# Patient Record
Sex: Male | Born: 1955 | Race: White | Hispanic: No | Marital: Married | Smoking: Former smoker
Health system: Southern US, Community
[De-identification: ages and names within clinical notes are randomized; demographics above are authoritative.]

## PROBLEM LIST (undated history)

## (undated) DIAGNOSIS — E119 Type 2 diabetes mellitus without complications: Secondary | ICD-10-CM

## (undated) DIAGNOSIS — G629 Polyneuropathy, unspecified: Secondary | ICD-10-CM

## (undated) DIAGNOSIS — I714 Abdominal aortic aneurysm, without rupture: Secondary | ICD-10-CM

## (undated) DIAGNOSIS — I619 Nontraumatic intracerebral hemorrhage, unspecified: Secondary | ICD-10-CM

## (undated) DIAGNOSIS — I499 Cardiac arrhythmia, unspecified: Secondary | ICD-10-CM

## (undated) DIAGNOSIS — I779 Disorder of arteries and arterioles, unspecified: Secondary | ICD-10-CM

## (undated) DIAGNOSIS — M1732 Unilateral post-traumatic osteoarthritis, left knee: Secondary | ICD-10-CM

## (undated) DIAGNOSIS — E785 Hyperlipidemia, unspecified: Secondary | ICD-10-CM

## (undated) DIAGNOSIS — I7143 Infrarenal abdominal aortic aneurysm, without rupture: Secondary | ICD-10-CM

## (undated) DIAGNOSIS — I2510 Atherosclerotic heart disease of native coronary artery without angina pectoris: Secondary | ICD-10-CM

## (undated) HISTORY — DX: Nontraumatic intracerebral hemorrhage, unspecified: I61.9

## (undated) HISTORY — DX: Disorder of arteries and arterioles, unspecified: I77.9

## (undated) HISTORY — PX: VARICOSE VEIN SURGERY: SHX832

## (undated) HISTORY — DX: Abdominal aortic aneurysm, without rupture: I71.4

## (undated) HISTORY — DX: Polyneuropathy, unspecified: G62.9

## (undated) HISTORY — DX: Infrarenal abdominal aortic aneurysm, without rupture: I71.43

## (undated) HISTORY — DX: Atherosclerotic heart disease of native coronary artery without angina pectoris: I25.10

## (undated) HISTORY — DX: Hyperlipidemia, unspecified: E78.5

---
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
## 1986-03-07 HISTORY — PX: ORIF FEMUR FRACTURE: SHX2119

## 2000-09-21 ENCOUNTER — Encounter: Payer: Self-pay | Admitting: Neurosurgery

## 2000-09-21 ENCOUNTER — Inpatient Hospital Stay (HOSPITAL_COMMUNITY): Admission: AD | Admit: 2000-09-21 | Discharge: 2000-09-22 | Payer: Self-pay | Admitting: Neurosurgery

## 2002-08-27 IMAGING — XA IR ANGIO/CAROTID/CERV/UNI*L*
1 series · 12 of 24 positions shown · IV contrast (omnipaque)
Comparison: none

FINDINGS
CLINICAL DATA: PATIENT WITH A LEFT CEREBRAL HEMISPHERE SUBARACHNOID HEMORRHAGE.
CAROTID AND CEREBRAL ARTERIOGRAMS
FOLLOWING A FULL EXPLANATION OF THE PROCEDURE ALONG WITH THE POTENTIAL ASSOCIATED COMPLICATIONS, AN
INFORMED WITNESSED CONSENT WAS OBTAINED.
THE RIGHT GROIN WAS PREPPED AND DRAPED IN THE USUAL STERILE FASHION.  THEREFORE, USING A MODIFIED
SELDINGER TECHNIQUE, TRANSFEMORAL ACCESS TO THE RIGHT COMMON FEMORAL ARTERY WAS OBTAINED WITHOUT
DIFFICULTY.  OVER A 0.035 INCH GUIDE WIRE, A 6 FRENCH PINNACLE SHEATH WAS INSERTED.
THROUGH THIS AND ALSO OVER A 0.036 INCH GUIDE WIRE, A 5 FRENCH JB-1 CATHETER WAS ADVANCED INTO THE
AORTIC ARCH REGION AND SELECTIVE CANNULATION ARTERIOGRAMS WERE PERFORMED OF THE LEFT  COMMON
CAROTID ARTERY AND THE LEFT VERTEBRAL ARTERY IN THAT ORDER.  THERE WERE NO ACUTE COMPLICATIONS.
THE PATIENT TOLERATED THE PROCEDURE WELL.
MEDICATIONS UTILIZED:  VERSED 1 MG IV X 1.
CONTRAST UTILIZED:  OMNIPAQUE 300, APPROXIMATELY 20 CC.

[Series 1: run · 12 of 62 slices shown]
[im 3/62]
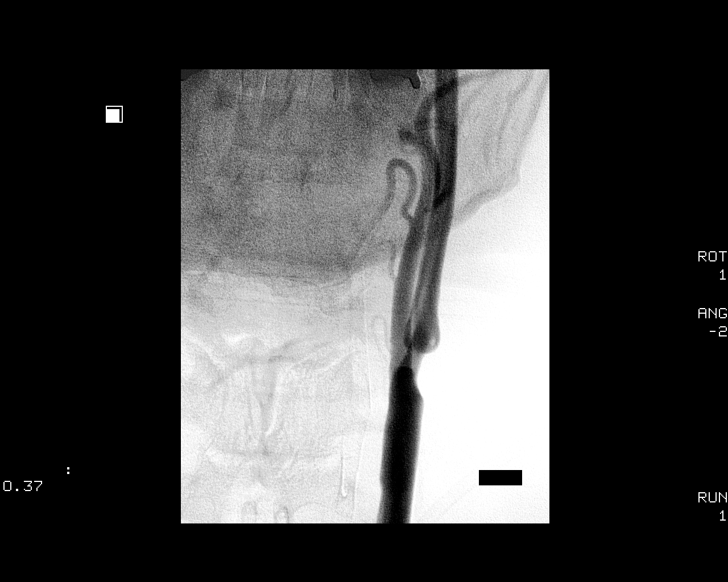
[im 8/62]
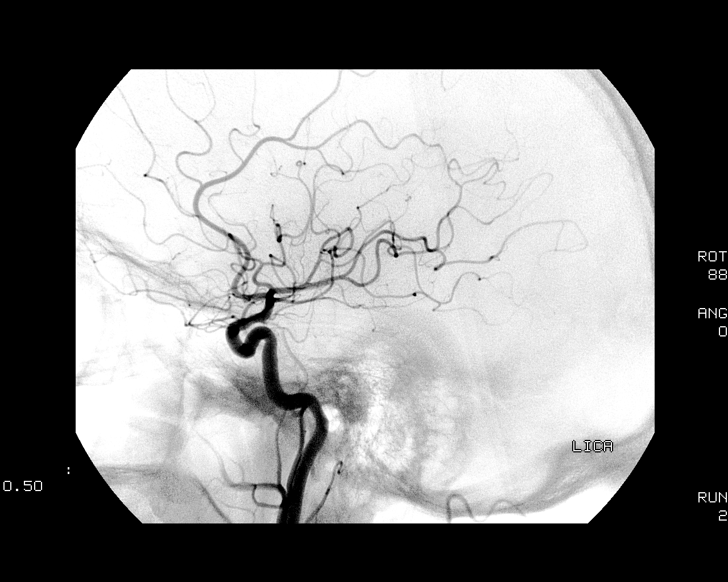
[im 14/62]
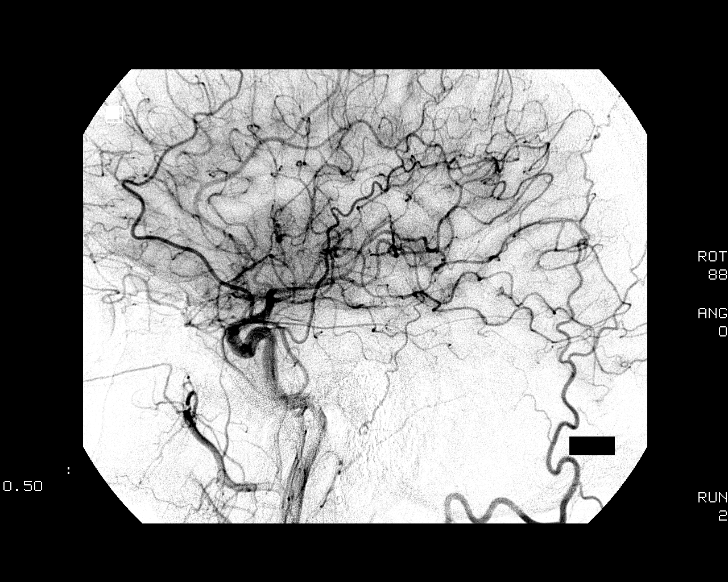
[im 19/62]
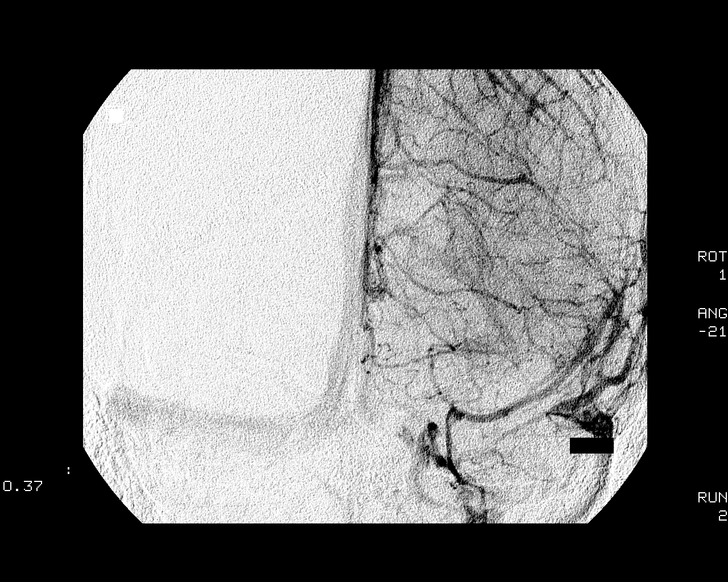
[im 24/62]
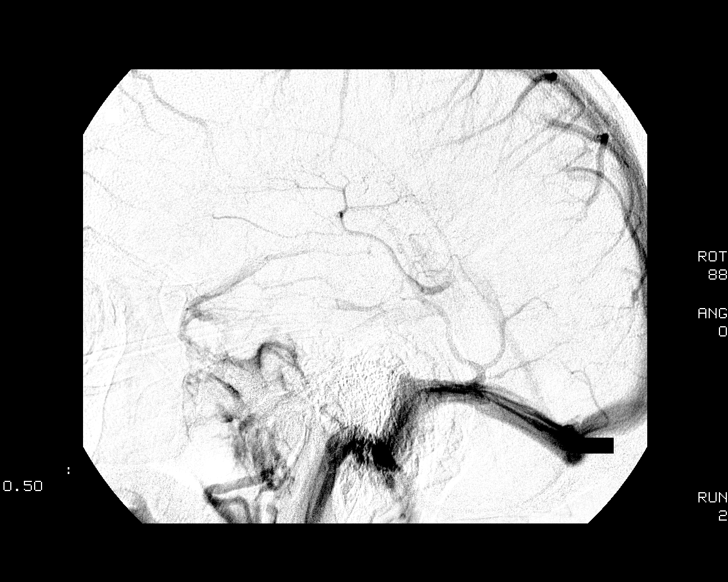
[im 30/62]
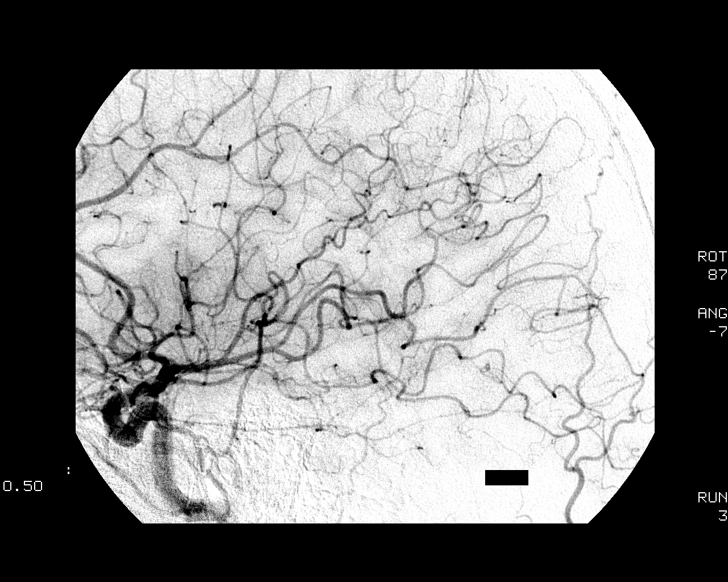
[im 35/62]
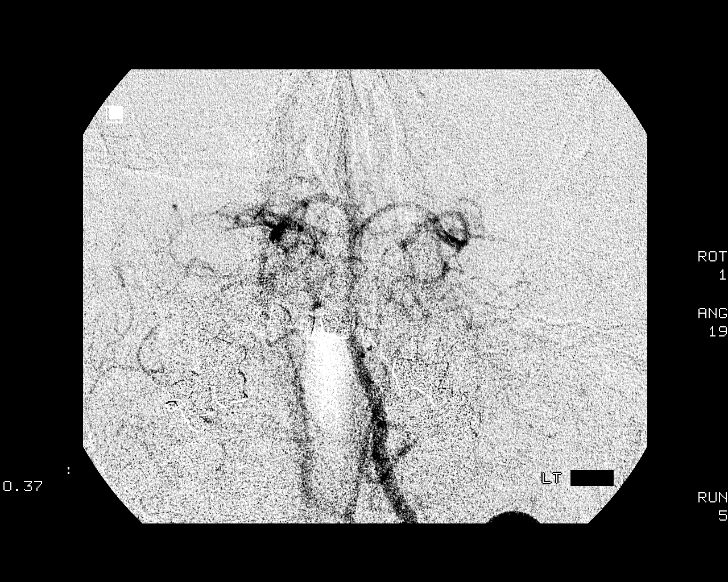
[im 40/62]
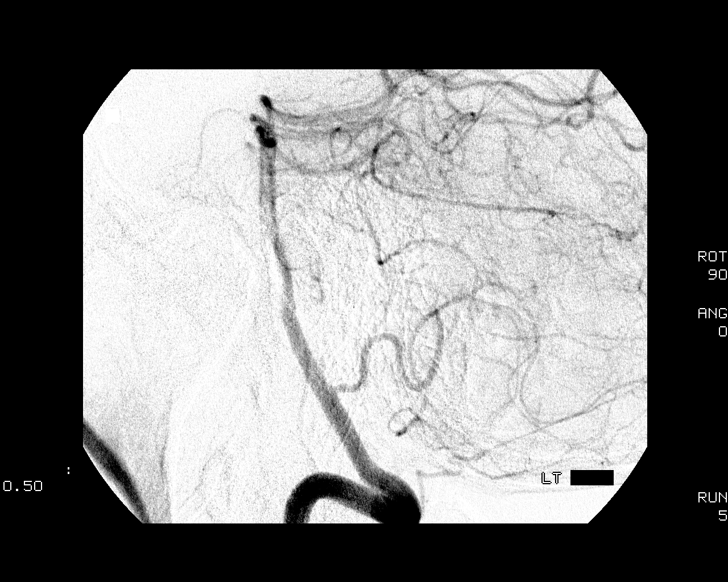
[im 46/62]
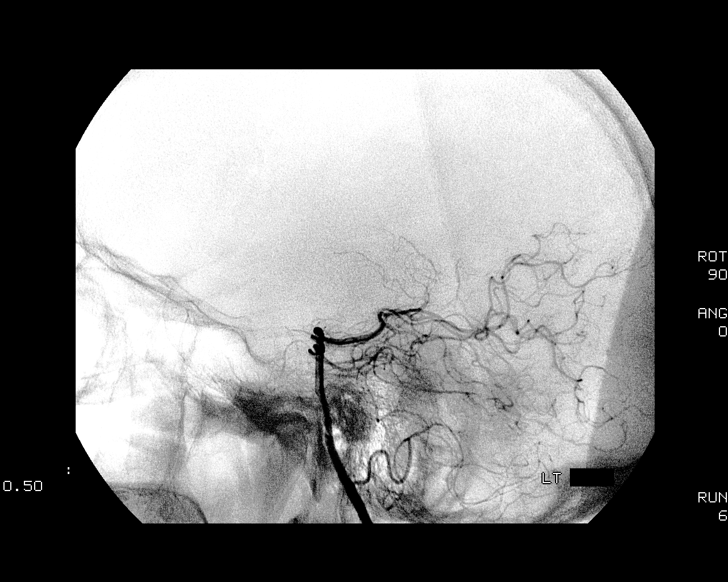
[im 51/62]
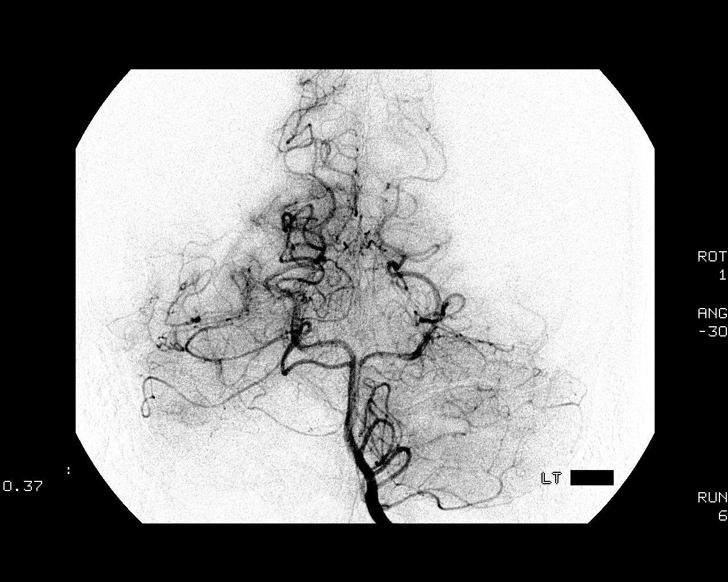
[im 56/62]
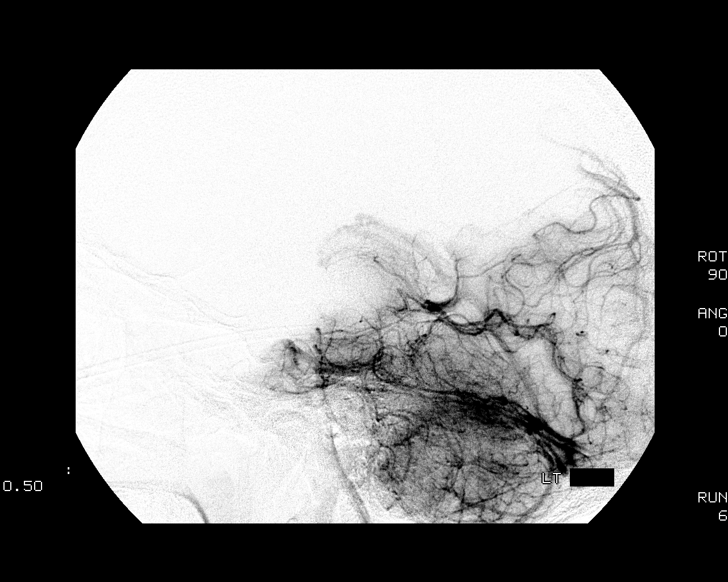
[im 62/62]
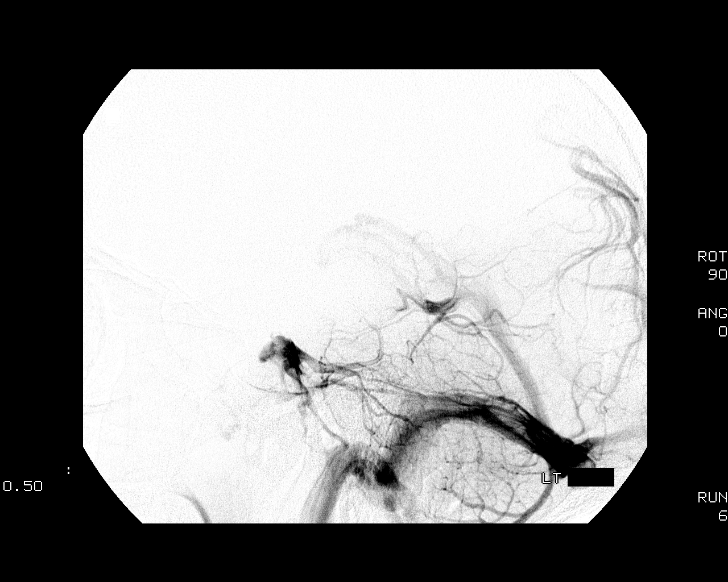

[12 of 24 positions shown; findings below may reference images not displayed]

FINDINGS: THE LEFT COMMON CAROTID ARTERIOGRAM DEMONSTRATES THE LEFT EXTERNAL CAROTID ARTERY ORIGIN
AND BRANCHES TO BE NORMAL.
THE LEFT CAROTID BULB DEMONSTRATES APPROXIMATELY 30% NARROWING DUE TO A SMOOTH CIRCUMFERENTIAL
PLAQUE WITHOUT ACUTE ULCERATION.  THIS IS BY THE [facility] CRITERIA.
MORE DISTALLY THE LEFT INTERNAL CAROTID ARTERY ASCENDS NORMALLY TO THE CRANIAL SKULL BASE. THE
PETROUS, CAVERNOUS AND THE SUPRACLINOID SEGMENTS ARE NORMAL.  THE LEFT ANTERIOR AND THE LEFT MIDDLE
CEREBRAL ARTERIES ALSO OPACIFY NORMAL IN THE CAPILLARY AND VENOUS PHASES.  THERE IS NO HYPEREMIA,
HYPERVASCULARITY, ARTERIAL VENOUS SHUNTING OF BLOOD OR ANEURYSMS OR VASCULAR ANOMALIES IDENTIFIED.
THE DURAL VENOUS SINUS AND THE VENOUS DRAINAGE ARE WITHIN NORMAL LIMITS.
LEFT VERTEBRAL ARTERY ORIGIN IS NORMAL.  THE VESSEL ALSO ASCENDS NORMALLY TO THE CRANIAL SKULL
BASE.  THE BASILAR ARTERY, THE POSTERIOR CEREBRAL ARTERIES, THE SUPERIOR CEREBELLAR ARTERIES AND
THE ANTERIOR-INFERIOR CEREBELLAR ARTERIES ARE NORMALLY OPACIFIED INTO THE CAPILLARY AND THE VENOUS
PHASES.
IMPRESSION
1.  ANGIOGRAPHICALLY NO EVIDENCE OF AN ANEURYSM, ARTERIAL VENOUS MALFORMATION, VASCULAR OCCLUSIONS
OR ARTERIAL VENOUS SHUNTING NOTED.
2.  NO ANGIOGRAPHIC EVIDENCE OF VASOSPASM NOTED.
3.  APPROXIMATELY 30% NARROWING OF THE LEFT INTERNAL CAROTID AT THE BULB DUE TO ATHEROSCLEROTIC
DISEASE BY [facility]ET CRITERIA.

## 2013-11-18 ENCOUNTER — Encounter: Payer: Self-pay | Admitting: Vascular Surgery

## 2013-11-18 ENCOUNTER — Other Ambulatory Visit: Payer: Self-pay

## 2013-11-18 DIAGNOSIS — I6529 Occlusion and stenosis of unspecified carotid artery: Secondary | ICD-10-CM

## 2013-11-19 ENCOUNTER — Ambulatory Visit (HOSPITAL_COMMUNITY)
Admission: RE | Admit: 2013-11-19 | Discharge: 2013-11-19 | Disposition: A | Discharge status: Home / Self Care | Payer: BC Managed Care – PPO | Source: Ambulatory Visit | Attending: Vascular Surgery | Admitting: Vascular Surgery

## 2013-11-19 ENCOUNTER — Encounter: Payer: Self-pay | Admitting: Vascular Surgery

## 2013-11-19 ENCOUNTER — Ambulatory Visit (INDEPENDENT_AMBULATORY_CARE_PROVIDER_SITE_OTHER): Payer: BC Managed Care – PPO | Admitting: Vascular Surgery

## 2013-11-19 VITALS — BP 137/80 | HR 54 | Ht 73.0 in | Wt 217.0 lb

## 2013-11-19 DIAGNOSIS — I6529 Occlusion and stenosis of unspecified carotid artery: Secondary | ICD-10-CM | POA: Insufficient documentation

## 2013-11-19 NOTE — Progress Notes (Signed)
Subjective:     Patient ID: Jeffrey [name], male   DOB: [birth year], 58 y.o.   MRN: [id number]  HPI this 58 year old male is referred for a recently discovered left ICA occlusion. Patient has been having blurred vision in the left eye for several years which has worsened. He saw his ophthalmologist 2 weeks ago who noted hemorrhage in the left retinal area. Patient has had no sudden onset of loss of vision, lateralizing weakness, aphasia, amaurosis fugax, diplopia, or syncope. Patient has no history of stroke but did have a subarachnoid hemorrhage in 2002 causing transient numbness on the right side which resolved. He is currently on Plavix.  Past Medical History  Diagnosis Date  . Hyperlipidemia   . Intracerebral hemorrhage 1960    History  Substance Use Topics  . Smoking status: Former Smoker    Types: Cigarettes    Quit date: 11/19/2012  . Smokeless tobacco: Not on file  . Alcohol Use: No    History reviewed. No pertinent family history.  No Known Allergies  Current outpatient prescriptions:atorvastatin (LIPITOR) 40 MG tablet, Take 40 mg by mouth daily., Disp: , Rfl: ;  clopidogrel (PLAVIX) 75 MG tablet, Take 75 mg by mouth daily., Disp: , Rfl: ;  gabapentin (NEURONTIN) 100 MG capsule, Take 100 mg by mouth 3 (three) times daily., Disp: , Rfl: ;  loratadine (CLARITIN) 10 MG tablet, Take 10 mg by mouth daily., Disp: , Rfl:  Omega-3 Fatty Acids (FISH OIL) 1000 MG CAPS, Take 1 capsule by mouth daily., Disp: , Rfl: ;  vitamin B-12 (CYANOCOBALAMIN) 1000 MCG tablet, Take 1,000 mcg by mouth daily., Disp: , Rfl: ;  aspirin 81 MG tablet, Take 81 mg by mouth daily., Disp: , Rfl: ;  colchicine 0.6 MG tablet, Take 0.6 mg by mouth as needed., Disp: , Rfl: ;  naproxen (NAPROSYN) 500 MG tablet, Take 500 mg by mouth as needed., Disp: , Rfl:  triamcinolone cream (KENALOG) 0.1 %, Apply 1 application topically 2 (two) times daily., Disp: , Rfl:   BP 137/80  Pulse 54  Ht  (1.854 m)  Wt 217 lb (98.431  kg)  BMI 28.64 kg/m2  SpO2 100%  Body mass index is 28.64 kg/(m^2).          Review of Systems has occasional chest pressure , had some transient numbness in fingers of the right hand lasting only a few seconds a few days ago. All other systems negative other than history of a crushed left femur many years ago which causes left leg discomfort.     Objective:   Physical Exam BP 137/80  Pulse 54  Ht  (1.854 m)  Wt 217 lb (98.431 kg)  BMI 28.64 kg/m2  SpO2 100%  Gen.-alert and oriented x3 in no apparent distress HEENT normal for age Lungs no rhonchi or wheezing Cardiovascular regular rhythm no murmurs carotid pulses 3+ palpable no bruits audible Abdomen soft nontender no palpable masses Musculoskeletal free of  major deformities Skin clear -no rashes Neurologic normal Lower extremities 3+ femoral and dorsalis pedis pulses palpable bilaterally with no edema  Today I ordered a carotid duplex exam of the left neck and compare this to the study which was performed by bringing her radiology. I agree that the left ICA is chronically occluded with a patent left common carotid artery. We did not repeat the right side the previous study reveals very mild right ICA plaque formation with no severe stenosis        Assessment:  Left ICA  occlusion-duration unknown Blurred vision left eye-being treated with glasses-unlikely to be due to left ICA occlusion History of subarachnoid hemorrhage-asymptomatic from that standpoint Mild right ICA stenosis    Plan:     Patient will return in one year for followup carotid duplex exam in our office unless he develops any new neurologic symptoms in the interim Needs to be on aspirin or Plavix chronically

## 2013-11-19 NOTE — Addendum Note (Signed)
Addended by: Sharee Pimple on: 11/19/2013 01:33 PM   Modules accepted: Orders

## 2013-11-29 ENCOUNTER — Other Ambulatory Visit (HOSPITAL_COMMUNITY): Payer: Self-pay

## 2013-11-29 ENCOUNTER — Encounter: Payer: Self-pay | Admitting: Vascular Surgery

## 2014-11-24 ENCOUNTER — Encounter: Payer: Self-pay | Admitting: Vascular Surgery

## 2014-11-25 ENCOUNTER — Encounter: Payer: Self-pay | Admitting: Family

## 2014-11-25 ENCOUNTER — Ambulatory Visit (HOSPITAL_COMMUNITY)
Admission: RE | Admit: 2014-11-25 | Discharge: 2014-11-25 | Disposition: A | Discharge status: Home / Self Care | Payer: BLUE CROSS/BLUE SHIELD | Source: Ambulatory Visit | Attending: Vascular Surgery | Admitting: Vascular Surgery

## 2014-11-25 ENCOUNTER — Ambulatory Visit (INDEPENDENT_AMBULATORY_CARE_PROVIDER_SITE_OTHER): Payer: BLUE CROSS/BLUE SHIELD | Admitting: Family

## 2014-11-25 VITALS — BP 138/78 | HR 50 | Temp 98.3°F | Resp 18 | Ht 73.0 in | Wt 212.6 lb

## 2014-11-25 DIAGNOSIS — Z87891 Personal history of nicotine dependence: Secondary | ICD-10-CM | POA: Diagnosis not present

## 2014-11-25 DIAGNOSIS — I6523 Occlusion and stenosis of bilateral carotid arteries: Secondary | ICD-10-CM | POA: Diagnosis not present

## 2014-11-25 DIAGNOSIS — I6522 Occlusion and stenosis of left carotid artery: Secondary | ICD-10-CM | POA: Diagnosis not present

## 2014-11-25 DIAGNOSIS — E785 Hyperlipidemia, unspecified: Secondary | ICD-10-CM | POA: Diagnosis not present

## 2014-11-25 NOTE — Progress Notes (Signed)
Established Carotid Patient   History of Present Illness  Jeffrey [name] is a 59 y.o. male patient of Dr. Hart Rochester who was initially referred for a recently discovered left ICA occlusion. Patient has been having blurred vision in the left eye for several years which has worsened. He saw his ophthalmologist who noted hemorrhage in the left retinal area. Patient has had no sudden onset of loss of vision, lateralizing weakness, aphasia, amaurosis fugax, diplopia, or syncope. Patient has no history of stroke but did have a subarachnoid hemorrhage in 2002 causing transient numbness on the right side which resolved. He is currently on Plavix.  The patient denies New Medical or Surgical History.  Pt Diabetic: no Pt smoker: former smoker, quit in 2015, smoked on and off since the 1970's  Pt meds include: Statin : yes ASA: yes Other anticoagulants/antiplatelets: Plavix   Past Medical History  Diagnosis Date  . Hyperlipidemia   . Intracerebral hemorrhage 1960    Social History Social History  Substance Use Topics  . Smoking status: Former Smoker    Types: Cigarettes    Quit date: 11/19/2013  . Smokeless tobacco: None  . Alcohol Use: No    Family History Family History  Problem Relation Age of Onset  . Vision loss Mother   . Diabetes Mother     Surgical History Past Surgical History  Procedure Laterality Date  . Orif femur fracture  1988  . Appendectomy  1960    No Known Allergies  Current Outpatient Prescriptions  Medication Sig Dispense Refill  . aspirin 81 MG tablet Take 81 mg by mouth daily.    Marland Kitchen atorvastatin (LIPITOR) 40 MG tablet Take 40 mg by mouth daily.    . clopidogrel (PLAVIX) 75 MG tablet Take 75 mg by mouth daily.    . colchicine 0.6 MG tablet Take 0.6 mg by mouth as needed.    . gabapentin (NEURONTIN) 100 MG capsule Take 100 mg by mouth 3 (three) times daily.    . isosorbide mononitrate (IMDUR) 30 MG 24 hr tablet TAKE 1 TABLET (30 MG TOTAL) BY MOUTH DAILY.     Marland Kitchen loratadine (CLARITIN) 10 MG tablet Take 10 mg by mouth daily.    . metoprolol tartrate (LOPRESSOR) 25 MG tablet Take 25 mg by mouth 2 (two) times daily.  5  . naproxen (NAPROSYN) 500 MG tablet Take 500 mg by mouth as needed.    . Omega-3 Fatty Acids (FISH OIL) 1000 MG CAPS Take 1 capsule by mouth daily.    Marland Kitchen triamcinolone cream (KENALOG) 0.1 % Apply 1 application topically 2 (two) times daily.    . vitamin B-12 (CYANOCOBALAMIN) 1000 MCG tablet Take 1,000 mcg by mouth daily.     No current facility-administered medications for this visit.    Review of Systems : See HPI for pertinent positives and negatives.  Physical Examination  Filed Vitals:   11/25/14 0945 11/25/14 0948  BP: 135/80 138/78  Pulse: 50   Temp: 98.3 F (36.8 C)   TempSrc: Oral   Resp: 18   Height:  (1.854 m)   Weight: 212 lb 9.6 oz (96.435 kg)   SpO2: 98%    Body mass index is 28.06 kg/(m^2).  General: WDWN male in NAD GAIT: normal Eyes: PERRLA Pulmonary:  Non-labored, CTAB, no  Rales, no rhonchi, & no wheezing.  Cardiac: regular rhythm,  no detected murmur.  VASCULAR EXAM Carotid Bruits Right Left   Negative Negative    Aorta is not palpable. Radial pulses  are 2+ palpable and equal.                                                                                                                            LE Pulses Right Left       POPLITEAL  not palpable   not palpable       POSTERIOR TIBIAL   palpable    palpable        DORSALIS PEDIS      ANTERIOR TIBIAL  palpable   palpable     Gastrointestinal: soft, nontender, BS WNL, no r/g,  no palpable masses.  Musculoskeletal: no muscle atrophy/wasting. M/S 5/5 throughout, extremities without ischemic changes.  Neurologic: A&O X 3; Appropriate Affect, Speech is normal CN 2-12 intact, pain and light touch intact in extremities, Motor exam as listed above.   Non-Invasive Vascular Imaging CAROTID DUPLEX 11/25/2014   Right ICA: <40%  stenosis. Left ICA: Occluded   These findings are unchanged from previous exam.   Assessment: Jeffrey [name] is a 59 y.o. male who presents with asymptomatic minimal right ICA stenosis and confirmed occlusion of the left ICA. The  ICA stenosis is  unchanged from previous exam. Pt needs to be on aspirin or Plavix chronically as indicated in Dr. Hart Rochester progress note from 11/19/2013.  Plan: Follow-up in 1 year with Carotid Duplex.   I discussed in depth with the patient the nature of atherosclerosis, and emphasized the importance of maximal medical management including strict control of blood pressure, blood glucose, and lipid levels, obtaining regular exercise, and continued cessation of smoking.  The patient is aware that without maximal medical management the underlying atherosclerotic disease process will progress, limiting the benefit of any interventions. The patient was given information about stroke prevention and what symptoms should prompt the patient to seek immediate medical care. Thank you for allowing [place] to participate in this patient's care.  Charisse March, RN, MSN, FNP-C [facility] Office: [phone]  Clinic Physician: Hart Rochester  11/25/2014 9:56 AM

## 2014-11-25 NOTE — Patient Instructions (Signed)
Stroke Prevention Some medical conditions and behaviors are associated with an increased chance of having a stroke. You may prevent a stroke by making healthy choices and managing medical conditions. HOW CAN I REDUCE MY RISK OF HAVING A STROKE?   Stay physically active. Get at least 30 minutes of activity on most or all days.  Do not smoke. It may also be helpful to avoid exposure to secondhand smoke.  Limit alcohol use. Moderate alcohol use is considered to be:  No more than 2 drinks per day for men.  No more than 1 drink per day for nonpregnant women.  Eat healthy foods. This involves:  Eating 5 or more servings of fruits and vegetables a day.  Making dietary changes that address high blood pressure (hypertension), high cholesterol, diabetes, or obesity.  Manage your cholesterol levels.  Making food choices that are high in fiber and low in saturated fat, trans fat, and cholesterol may control cholesterol levels.  Take any prescribed medicines to control cholesterol as directed by your health care Jeffrey [name].  Manage your diabetes.  Controlling your carbohydrate and sugar intake is recommended to manage diabetes.  Take any prescribed medicines to control diabetes as directed by your health care Jeffrey [name].  Control your hypertension.  Making food choices that are low in salt (sodium), saturated fat, trans fat, and cholesterol is recommended to manage hypertension.  Take any prescribed medicines to control hypertension as directed by your health care Jeffrey [name].  Maintain a healthy weight.  Reducing calorie intake and making food choices that are low in sodium, saturated fat, trans fat, and cholesterol are recommended to manage weight.  Stop drug abuse.  Avoid taking birth control pills.  Talk to your health care Jeffrey [name] about the risks of taking birth control pills if you are over 35 years old, smoke, get migraines, or have ever had a blood clot.  Get evaluated for sleep  disorders (sleep apnea).  Talk to your health care Jeffrey [name] about getting a sleep evaluation if you snore a lot or have excessive sleepiness.  Take medicines only as directed by your health care Jeffrey [name].  For some people, aspirin or blood thinners (anticoagulants) are helpful in reducing the risk of forming abnormal blood clots that can lead to stroke. If you have the irregular heart rhythm of atrial fibrillation, you should be on a blood thinner unless there is a good reason you cannot take them.  Understand all your medicine instructions.  Make sure that other conditions (such as anemia or atherosclerosis) are addressed. SEEK IMMEDIATE MEDICAL CARE IF:   You have sudden weakness or numbness of the face, arm, or leg, especially on one side of the body.  Your face or eyelid droops to one side.  You have sudden confusion.  You have trouble speaking (aphasia) or understanding.  You have sudden trouble seeing in one or both eyes.  You have sudden trouble walking.  You have dizziness.  You have a loss of balance or coordination.  You have a sudden, severe headache with no known cause.  You have new chest pain or an irregular heartbeat. Any of these symptoms may represent a serious problem that is an emergency. Do not wait to see if the symptoms will go away. Get medical help at once. Call your local emergency services (911 in [place] Do not drive yourself to the hospital. Document Released: 03/31/2004 Document Revised: 07/08/2013 Document Reviewed: 08/24/2012 [employer] Patient Information 2015 [employer]. This information is not intended to replace advice given   to you by your health care Jeffrey [name]. Make sure you discuss any questions you have with your health care Jeffrey [name].  

## 2015-11-20 ENCOUNTER — Encounter: Payer: Self-pay | Admitting: Family

## 2015-11-26 ENCOUNTER — Ambulatory Visit (INDEPENDENT_AMBULATORY_CARE_PROVIDER_SITE_OTHER): Payer: BLUE CROSS/BLUE SHIELD | Admitting: Family

## 2015-11-26 ENCOUNTER — Encounter: Payer: Self-pay | Admitting: Family

## 2015-11-26 ENCOUNTER — Ambulatory Visit (HOSPITAL_COMMUNITY)
Admission: RE | Admit: 2015-11-26 | Discharge: 2015-11-26 | Disposition: A | Discharge status: Home / Self Care | Payer: BLUE CROSS/BLUE SHIELD | Source: Ambulatory Visit | Attending: Family | Admitting: Family

## 2015-11-26 ENCOUNTER — Ambulatory Visit: Payer: BLUE CROSS/BLUE SHIELD | Admitting: Family

## 2015-11-26 ENCOUNTER — Encounter (HOSPITAL_COMMUNITY): Payer: BLUE CROSS/BLUE SHIELD

## 2015-11-26 VITALS — BP 124/69 | HR 48 | Temp 96.3°F | Resp 16 | Ht 73.0 in | Wt 208.0 lb

## 2015-11-26 DIAGNOSIS — I6523 Occlusion and stenosis of bilateral carotid arteries: Secondary | ICD-10-CM | POA: Diagnosis not present

## 2015-11-26 DIAGNOSIS — Z87891 Personal history of nicotine dependence: Secondary | ICD-10-CM | POA: Diagnosis not present

## 2015-11-26 DIAGNOSIS — I6522 Occlusion and stenosis of left carotid artery: Secondary | ICD-10-CM | POA: Diagnosis present

## 2015-11-26 LAB — VAS US CAROTID
LCCADDIAS: -12 cm/s
LEFT ECA DIAS: -30 cm/s
LICAPSYS: -16 cm/s
Left CCA dist sys: -80 cm/s
Left CCA prox dias: 21 cm/s
Left CCA prox sys: 136 cm/s
Left ICA prox dias: 0 cm/s
RCCAPSYS: 144 cm/s
RIGHT CCA MID DIAS: 28 cm/s
RIGHT ECA DIAS: -24 cm/s
Right CCA prox dias: 24 cm/s
Right cca dist sys: -124 cm/s

## 2015-11-26 NOTE — Patient Instructions (Signed)
Stroke Prevention Some medical conditions and behaviors are associated with an increased chance of having a stroke. You may prevent a stroke by making healthy choices and managing medical conditions. HOW CAN I REDUCE MY RISK OF HAVING A STROKE?   Stay physically active. Get at least 30 minutes of activity on most or all days.  Do not smoke. It may also be helpful to avoid exposure to secondhand smoke.  Limit alcohol use. Moderate alcohol use is considered to be:  No more than 2 drinks per day for men.  No more than 1 drink per day for nonpregnant women.  Eat healthy foods. This involves:  Eating 5 or more servings of fruits and vegetables a day.  Making dietary changes that address high blood pressure (hypertension), high cholesterol, diabetes, or obesity.  Manage your cholesterol levels.  Making food choices that are high in fiber and low in saturated fat, trans fat, and cholesterol may control cholesterol levels.  Take any prescribed medicines to control cholesterol as directed by your health care provider.  Manage your diabetes.  Controlling your carbohydrate and sugar intake is recommended to manage diabetes.  Take any prescribed medicines to control diabetes as directed by your health care provider.  Control your hypertension.  Making food choices that are low in salt (sodium), saturated fat, trans fat, and cholesterol is recommended to manage hypertension.  Ask your health care provider if you need treatment to lower your blood pressure. Take any prescribed medicines to control hypertension as directed by your health care provider.  If you are 18-39 years of age, have your blood pressure checked every 3-5 years. If you are 40 years of age or older, have your blood pressure checked every year.  Maintain a healthy weight.  Reducing calorie intake and making food choices that are low in sodium, saturated fat, trans fat, and cholesterol are recommended to manage  weight.  Stop drug abuse.  Avoid taking birth control pills.  Talk to your health care provider about the risks of taking birth control pills if you are over 35 years old, smoke, get migraines, or have ever had a blood clot.  Get evaluated for sleep disorders (sleep apnea).  Talk to your health care provider about getting a sleep evaluation if you snore a lot or have excessive sleepiness.  Take medicines only as directed by your health care provider.  For some people, aspirin or blood thinners (anticoagulants) are helpful in reducing the risk of forming abnormal blood clots that can lead to stroke. If you have the irregular heart rhythm of atrial fibrillation, you should be on a blood thinner unless there is a good reason you cannot take them.  Understand all your medicine instructions.  Make sure that other conditions (such as anemia or atherosclerosis) are addressed. SEEK IMMEDIATE MEDICAL CARE IF:   You have sudden weakness or numbness of the face, arm, or leg, especially on one side of the body.  Your face or eyelid droops to one side.  You have sudden confusion.  You have trouble speaking (aphasia) or understanding.  You have sudden trouble seeing in one or both eyes.  You have sudden trouble walking.  You have dizziness.  You have a loss of balance or coordination.  You have a sudden, severe headache with no known cause.  You have new chest pain or an irregular heartbeat. Any of these symptoms may represent a serious problem that is an emergency. Do not wait to see if the symptoms will   go away. Get medical help at once. Call your local emergency services (911 in [place] Do not drive yourself to the hospital.   This information is not intended to replace advice given to you by your health care provider. Make sure you discuss any questions you have with your health care provider.   Document Released: 03/31/2004 Document Revised: 03/14/2014 Document Reviewed:  08/24/2012 Elsevier Interactive Patient Education 2016 [employer]  

## 2015-11-26 NOTE — Progress Notes (Signed)
Chief Complaint: Follow up Extracranial Carotid Artery Stenosis   History of Present Illness  Jeffrey [name] is a 60 y.o. male patient of Dr. Hart Rochester who was initially referred for a recently discovered left ICA occlusion. Patient has been having blurred vision in the left eye for several years which has worsened. He saw his ophthalmologist who noted hemorrhage in the left retinal area. Patient has had no sudden onset of loss of vision, lateralizing weakness, aphasia, amaurosis fugax, diplopia, or syncope. Patient has no history of stroke but did have a subarachnoid hemorrhage in 2002 causing transient numbness on the right side which resolved. He is currently on Plavix.  The patient denies New Medical or Surgical History.  He denies feeling light headed, denies chest pain or dyspnea.  He sees a cardiologist in [place] for CAD.  Pt Diabetic: no Pt smoker: former smoker, quit in 2015, smoked on and off since the 1970's  Pt meds include: Statin : yes ASA: yes Other anticoagulants/antiplatelets: Plavix   Past Medical History:  Diagnosis Date  . Hyperlipidemia   . Intracerebral hemorrhage (HCC) 1960    Social History Social History  Substance Use Topics  . Smoking status: Former Smoker    Types: Cigarettes    Quit date: 11/19/2013  . Smokeless tobacco: Not on file  . Alcohol use No    Family History Family History  Problem Relation Age of Onset  . Vision loss Mother   . Diabetes Mother     Surgical History Past Surgical History:  Procedure Laterality Date  . APPENDECTOMY  1960  . ORIF FEMUR FRACTURE  1988    No Known Allergies  Current Outpatient Prescriptions  Medication Sig Dispense Refill  . aspirin 81 MG tablet Take 81 mg by mouth daily.    Marland Kitchen atorvastatin (LIPITOR) 40 MG tablet Take 40 mg by mouth daily.    . clopidogrel (PLAVIX) 75 MG tablet Take 75 mg by mouth daily.    . colchicine 0.6 MG tablet Take 0.6 mg by mouth as needed.    . gabapentin  (NEURONTIN) 100 MG capsule Take 100 mg by mouth 3 (three) times daily.    . isosorbide mononitrate (IMDUR) 30 MG 24 hr tablet TAKE 1 TABLET (30 MG TOTAL) BY MOUTH DAILY.    Marland Kitchen loratadine (CLARITIN) 10 MG tablet Take 10 mg by mouth daily.    . metoprolol tartrate (LOPRESSOR) 25 MG tablet Take 25 mg by mouth 2 (two) times daily.  5  . naproxen (NAPROSYN) 500 MG tablet Take 500 mg by mouth as needed.    . Omega-3 Fatty Acids (FISH OIL) 1000 MG CAPS Take 1 capsule by mouth daily.    Marland Kitchen triamcinolone cream (KENALOG) 0.1 % Apply 1 application topically 2 (two) times daily.    . vitamin B-12 (CYANOCOBALAMIN) 1000 MCG tablet Take 1,000 mcg by mouth daily.     No current facility-administered medications for this visit.     Review of Systems : See HPI for pertinent positives and negatives.  Physical Examination  Vitals:   11/26/15 1006 11/26/15 1009  BP: 127/65 124/69  Pulse: (!) 48   Resp: 16   Temp: (!) 96.3 F (35.7 C)   TempSrc: Oral   SpO2: 97%   Weight: 208 lb (94.3 kg)   Height: 6\' 1"  (1.854 m)    Body mass index is 27.44 kg/m.  General: WDWN male in NAD GAIT: normal Eyes: PERRLA Pulmonary: Respirations are non-labored, CTAB, no  Rales, no rhonchi, &  no wheezing.  Cardiac: regular rhythm, bradycardic rate (on a beta blocker),  no detected murmur.  VASCULAR EXAM Carotid Bruits Right Left   Negative Negative    Aorta is not palpable. Radial pulses are 2+ palpable and equal.                                                                                                                                          LE Pulses Right Left       POPLITEAL  not palpable  not palpable       POSTERIOR TIBIAL   palpable   palpable       DORSALIS PEDIS      ANTERIOR TIBIAL  palpable  palpable    Gastrointestinal: soft, nontender, BS WNL, no r/g,  no palpable masses.  Musculoskeletal: no muscle atrophy/wasting. M/S 5/5 throughout, extremities without ischemic  changes.  Neurologic: A&O X 3; Appropriate Affect, Speech is normal CN 2-12 intact, pain and light touch intact in extremities, Motor exam as listed above.   Assessment: Jeffrey [name] is a 60 y.o. male who presents with asymptomatic minimal right ICA stenosis and confirmed occlusion of the left ICA. The  ICA stenosis is  unchanged from previous exam a year ago.  Pt needs to be on aspirin or Plavix chronically as indicated in Dr. Hart RochesterLawson progress note from 11/19/2013.   Plan: Follow-up in 1 year with Carotid Duplex scan.  I discussed in depth with the patient the nature of atherosclerosis, and emphasized the importance of maximal medical management including strict control of blood pressure, blood glucose, and lipid levels, obtaining regular exercise, and continued cessation of smoking.  The patient is aware that without maximal medical management the underlying atherosclerotic disease process will progress, limiting the benefit of any interventions. The patient was given information about stroke prevention and what symptoms should prompt the patient to seek immediate medical care. Thank you for allowing us to participate in this patient's care.  Charisse MarchSuzanne Nickel, RN, MSN, FNP-C [facility] Office: [phone]  Clinic Physician: Darrick PennaFields  11/26/15 10:23 AM

## 2016-02-08 NOTE — Addendum Note (Signed)
Addended by: Burton ApleyPETTY, Meng Winterton A on: 02/08/2016 03:44 PM   Modules accepted: Orders

## 2016-04-26 DIAGNOSIS — H6692 Otitis media, unspecified, left ear: Secondary | ICD-10-CM | POA: Diagnosis not present

## 2016-04-26 DIAGNOSIS — J0100 Acute maxillary sinusitis, unspecified: Secondary | ICD-10-CM | POA: Diagnosis not present

## 2016-04-26 DIAGNOSIS — H81399 Other peripheral vertigo, unspecified ear: Secondary | ICD-10-CM | POA: Diagnosis not present

## 2016-05-05 DIAGNOSIS — K591 Functional diarrhea: Secondary | ICD-10-CM | POA: Diagnosis not present

## 2016-06-16 DIAGNOSIS — J0100 Acute maxillary sinusitis, unspecified: Secondary | ICD-10-CM | POA: Diagnosis not present

## 2016-07-06 DIAGNOSIS — I2510 Atherosclerotic heart disease of native coronary artery without angina pectoris: Secondary | ICD-10-CM | POA: Diagnosis not present

## 2016-07-06 DIAGNOSIS — I10 Essential (primary) hypertension: Secondary | ICD-10-CM | POA: Diagnosis not present

## 2016-07-06 DIAGNOSIS — I739 Peripheral vascular disease, unspecified: Secondary | ICD-10-CM | POA: Diagnosis not present

## 2016-07-13 DIAGNOSIS — I739 Peripheral vascular disease, unspecified: Secondary | ICD-10-CM | POA: Diagnosis not present

## 2016-07-13 DIAGNOSIS — I10 Essential (primary) hypertension: Secondary | ICD-10-CM | POA: Diagnosis not present

## 2016-07-13 DIAGNOSIS — I2510 Atherosclerotic heart disease of native coronary artery without angina pectoris: Secondary | ICD-10-CM | POA: Diagnosis not present

## 2016-07-17 DIAGNOSIS — J0100 Acute maxillary sinusitis, unspecified: Secondary | ICD-10-CM | POA: Diagnosis not present

## 2016-07-17 DIAGNOSIS — J209 Acute bronchitis, unspecified: Secondary | ICD-10-CM | POA: Diagnosis not present

## 2016-07-19 DIAGNOSIS — H353132 Nonexudative age-related macular degeneration, bilateral, intermediate dry stage: Secondary | ICD-10-CM | POA: Diagnosis not present

## 2016-07-19 DIAGNOSIS — H3530 Unspecified macular degeneration: Secondary | ICD-10-CM | POA: Diagnosis not present

## 2016-07-19 DIAGNOSIS — H3562 Retinal hemorrhage, left eye: Secondary | ICD-10-CM | POA: Diagnosis not present

## 2016-08-23 DIAGNOSIS — J019 Acute sinusitis, unspecified: Secondary | ICD-10-CM | POA: Diagnosis not present

## 2016-10-03 DIAGNOSIS — J0100 Acute maxillary sinusitis, unspecified: Secondary | ICD-10-CM | POA: Diagnosis not present

## 2016-11-10 DIAGNOSIS — J0100 Acute maxillary sinusitis, unspecified: Secondary | ICD-10-CM | POA: Diagnosis not present

## 2016-11-10 DIAGNOSIS — J301 Allergic rhinitis due to pollen: Secondary | ICD-10-CM | POA: Diagnosis not present

## 2016-12-01 ENCOUNTER — Ambulatory Visit (HOSPITAL_COMMUNITY)
Admission: RE | Admit: 2016-12-01 | Discharge: 2016-12-01 | Disposition: A | Discharge status: Home / Self Care | Payer: 59 | Source: Ambulatory Visit | Attending: Vascular Surgery | Admitting: Vascular Surgery

## 2016-12-01 ENCOUNTER — Encounter: Payer: Self-pay | Admitting: Family

## 2016-12-01 ENCOUNTER — Ambulatory Visit (INDEPENDENT_AMBULATORY_CARE_PROVIDER_SITE_OTHER): Payer: 59 | Admitting: Family

## 2016-12-01 VITALS — BP 118/69 | HR 56 | Temp 97.3°F | Resp 18 | Ht 73.0 in | Wt 204.0 lb

## 2016-12-01 DIAGNOSIS — I6522 Occlusion and stenosis of left carotid artery: Secondary | ICD-10-CM | POA: Diagnosis not present

## 2016-12-01 DIAGNOSIS — I6521 Occlusion and stenosis of right carotid artery: Secondary | ICD-10-CM

## 2016-12-01 DIAGNOSIS — Z87891 Personal history of nicotine dependence: Secondary | ICD-10-CM

## 2016-12-01 LAB — VAS US CAROTID
LCCAPDIAS: 24 cm/s
LEFT ECA DIAS: -36 cm/s
LEFT VERTEBRAL DIAS: -18 cm/s
Left CCA dist dias: -16 cm/s
Left CCA dist sys: -82 cm/s
Left CCA prox sys: 153 cm/s
RCCAPDIAS: 24 cm/s
RIGHT CCA MID DIAS: 28 cm/s
RIGHT ECA DIAS: -21 cm/s
RIGHT VERTEBRAL DIAS: 10 cm/s
Right CCA prox sys: 120 cm/s
Right cca dist sys: -138 cm/s

## 2016-12-01 NOTE — Patient Instructions (Signed)
Stroke Prevention Some medical conditions and behaviors are associated with an increased chance of having a stroke. You may prevent a stroke by making healthy choices and managing medical conditions. How can I reduce my risk of having a stroke?  Stay physically active. Get at least 30 minutes of activity on most or all days.  Do not smoke. It may also be helpful to avoid exposure to secondhand smoke.  Limit alcohol use. Moderate alcohol use is considered to be: ? No more than 2 drinks per day for men. ? No more than 1 drink per day for nonpregnant women.  Eat healthy foods. This involves: ? Eating 5 or more servings of fruits and vegetables a day. ? Making dietary changes that address high blood pressure (hypertension), high cholesterol, diabetes, or obesity.  Manage your cholesterol levels. ? Making food choices that are high in fiber and low in saturated fat, trans fat, and cholesterol may control cholesterol levels. ? Take any prescribed medicines to control cholesterol as directed by your health care provider.  Manage your diabetes. ? Controlling your carbohydrate and sugar intake is recommended to manage diabetes. ? Take any prescribed medicines to control diabetes as directed by your health care provider.  Control your hypertension. ? Making food choices that are low in salt (sodium), saturated fat, trans fat, and cholesterol is recommended to manage hypertension. ? Ask your health care provider if you need treatment to lower your blood pressure. Take any prescribed medicines to control hypertension as directed by your health care provider. ? If you are 18-39 years of age, have your blood pressure checked every 3-5 years. If you are 40 years of age or older, have your blood pressure checked every year.  Maintain a healthy weight. ? Reducing calorie intake and making food choices that are low in sodium, saturated fat, trans fat, and cholesterol are recommended to manage  weight.  Stop drug abuse.  Avoid taking birth control pills. ? Talk to your health care provider about the risks of taking birth control pills if you are over 35 years old, smoke, get migraines, or have ever had a blood clot.  Get evaluated for sleep disorders (sleep apnea). ? Talk to your health care provider about getting a sleep evaluation if you snore a lot or have excessive sleepiness.  Take medicines only as directed by your health care provider. ? For some people, aspirin or blood thinners (anticoagulants) are helpful in reducing the risk of forming abnormal blood clots that can lead to stroke. If you have the irregular heart rhythm of atrial fibrillation, you should be on a blood thinner unless there is a good reason you cannot take them. ? Understand all your medicine instructions.  Make sure that other conditions (such as anemia or atherosclerosis) are addressed. Get help right away if:  You have sudden weakness or numbness of the face, arm, or leg, especially on one side of the body.  Your face or eyelid droops to one side.  You have sudden confusion.  You have trouble speaking (aphasia) or understanding.  You have sudden trouble seeing in one or both eyes.  You have sudden trouble walking.  You have dizziness.  You have a loss of balance or coordination.  You have a sudden, severe headache with no known cause.  You have new chest pain or an irregular heartbeat. Any of these symptoms may represent a serious problem that is an emergency. Do not wait to see if the symptoms will go away.   Get medical help at once. Call your local emergency services (911 in [place] Do not drive yourself to the hospital. This information is not intended to replace advice given to you by your health care provider. Make sure you discuss any questions you have with your health care provider. Document Released: 03/31/2004 Document Revised: 07/30/2015 Document Reviewed: 08/24/2012 Elsevier  Interactive Patient Education  2017 Elsevier Inc.     Preventing Cerebrovascular Disease Arteries are blood vessels that carry blood that contains oxygen from the heart to all parts of the body. Cerebrovascular disease affects arteries that supply the brain. Any condition that blocks or disrupts blood flow to the brain can cause cerebrovascular disease. Brain cells that lose blood supply start to die within minutes (stroke). Stroke is the main danger of cerebrovascular disease. Atherosclerosis and high blood pressure are common causes of cerebrovascular disease. Atherosclerosis is narrowing and hardening of an artery that results when fat, cholesterol, calcium, or other substances (plaque) build up inside an artery. Plaque reduces blood flow through the artery. High blood pressure increases the risk of bleeding inside the brain. Making diet and lifestyle changes to prevent atherosclerosis and high blood pressure lowers your risk of cerebrovascular disease. What nutrition changes can be made?  Eat more fruits, vegetables, and whole grains.  Reduce how much saturated fat you eat. To do this, eat less red meat and fewer full-fat dairy products.  Eat healthy proteins instead of red meat. Healthy proteins include: ? Fish. Eat fish that contains heart-healthy omega-3 fatty acids, twice a week. Examples include salmon, albacore tuna, mackerel, and herring. ? Chicken. ? Nuts. ? Low-fat or nonfat yogurt.  Avoid processed meats, like bacon and lunchmeat.  Avoid foods that contain: ? A lot of sugar, such as sweets and drinks with added sugar. ? A lot of salt (sodium). Avoid adding extra salt to your food, as told by your health care provider. ? Trans fats, such as margarine and baked goods. Trans fats may be listed as "partially hydrogenated oils" on food labels.  Check food labels to see how much sodium, sugar, and trans fats are in foods.  Use vegetable oils that contain low amounts of  saturated fat, such as olive oil or canola oil. What lifestyle changes can be made?  Drink alcohol in moderation. This means no more than 1 drink a day for nonpregnant women and 2 drinks a day for men. One drink equals 12 oz of beer, 5 oz of wine, or 1 oz of hard liquor.  If you are overweight, ask your health care provider to recommend a weight-loss plan for you. Losing 5-10 lb (2.2-4.5 kg) can reduce your risk of diabetes, atherosclerosis, and high blood pressure.  Exercise for 30?60 minutes on most days, or as much as told by your health care provider. ? Do moderate-intensity exercise, such as brisk walking, bicycling, and water aerobics. Ask your health care provider which activities are safe for you.  Do not use any products that contain nicotine or tobacco, such as cigarettes and e-cigarettes. If you need help quitting, ask your health care provider. Why are these changes important? Making these changes lowers your risk of many diseases that can cause cerebrovascular disease and stroke. Stroke is a leading cause of death and disability. Making these changes also improves your overall health and quality of life. What can I do to lower my risk? The following factors make you more likely to develop cerebrovascular disease:  Being overweight.  Smoking.  Being physically inactive.    Eating a high-fat diet.  Having certain health conditions, such as: ? Diabetes. ? High blood pressure. ? Heart disease. ? Atherosclerosis. ? High cholesterol. ? Sickle cell disease.  Talk with your health care provider about your risk for cerebrovascular disease. Work with your health care provider to control diseases that you have that may contribute to cerebrovascular disease. Your health care provider may prescribe medicines to help prevent major causes of cerebrovascular disease. Where to find more information: Learn more about preventing cerebrovascular disease from:  National Heart, Lung, and  Blood Institute: www.nhlbi.nih.gov/health/health-topics/topics/stroke  Centers for Disease Control and Prevention: cdc.gov/stroke/about.htm  Summary  Cerebrovascular disease can lead to a stroke.  Atherosclerosis and high blood pressure are major causes of cerebrovascular disease.  Making diet and lifestyle changes can reduce your risk of cerebrovascular disease.  Work with your health care provider to get your risk factors under control to reduce your risk of cerebrovascular disease. This information is not intended to replace advice given to you by your health care provider. Make sure you discuss any questions you have with your health care provider. Document Released: 03/08/2015 Document Revised: 09/11/2015 Document Reviewed: 03/08/2015 Elsevier Interactive Patient Education  2018 Elsevier Inc.  

## 2016-12-01 NOTE — Progress Notes (Signed)
Chief Complaint: Follow up Extracranial Carotid Artery Stenosis   History of Present Illness  Jeffrey [name] is a 61 y.o. male patient of Dr. Hart Rochester who was initially referred for a recently discovered left ICA occlusion. Patient has been having blurred vision in the left eye for several years which has worsened. He saw his ophthalmologist who noted hemorrhage in the left retinal area. Patient has had no sudden onset of loss of vision, lateralizing weakness, aphasia, amaurosis fugax, diplopia, or syncope. Patient has no history of stroke but did have a subarachnoid hemorrhage in 2002 causing transient numbness on the right side which resolved. He is currently on Plavix.  He denies feeling light headed, denies chest pain or dyspnea.  He sees a cardiologist in [place] for CAD.   In 1988 he sustained crush injuries to both legs.   He denies claudication sx's with walking. [place] stays active, [place], [place] hay. He recently put in a line for a septic tank.   Pt Diabetic: no Pt smoker: former smoker, quit in 2015, smoked on and off since the 1970's  Pt meds include: Statin : yes ASA: yes Other anticoagulants/antiplatelets: Plavix   Past Medical History:  Diagnosis Date  . Hyperlipidemia   . Intracerebral hemorrhage (HCC) 1960    Social History Social History  Substance Use Topics  . Smoking status: Former Smoker    Types: Cigarettes    Quit date: 11/19/2013  . Smokeless tobacco: Never Used  . Alcohol use No    Family History Family History  Problem Relation Age of Onset  . Vision loss Mother   . Diabetes Mother     Surgical History Past Surgical History:  Procedure Laterality Date  . APPENDECTOMY  1960  . ORIF FEMUR FRACTURE  1988    No Known Allergies  Current Outpatient Prescriptions  Medication Sig Dispense Refill  . aspirin 81 MG tablet Take 81 mg by mouth daily.    Marland Kitchen atorvastatin (LIPITOR) 40 MG tablet Take 80 mg by mouth daily.     .  clopidogrel (PLAVIX) 75 MG tablet Take 75 mg by mouth daily.    . colchicine 0.6 MG tablet Take 0.6 mg by mouth as needed.    . gabapentin (NEURONTIN) 100 MG capsule Take 100 mg by mouth 3 (three) times daily.    . isosorbide mononitrate (IMDUR) 30 MG 24 hr tablet TAKE 1 TABLET (30 MG TOTAL) BY MOUTH DAILY.    Marland Kitchen loratadine (CLARITIN) 10 MG tablet Take 10 mg by mouth daily.    . metoprolol tartrate (LOPRESSOR) 25 MG tablet Take 25 mg by mouth 2 (two) times daily.  5  . naproxen (NAPROSYN) 500 MG tablet Take 500 mg by mouth as needed.    . Omega-3 Fatty Acids (FISH OIL) 1000 MG CAPS Take 1 capsule by mouth daily.    Marland Kitchen triamcinolone cream (KENALOG) 0.1 % Apply 1 application topically 2 (two) times daily.    . vitamin B-12 (CYANOCOBALAMIN) 1000 MCG tablet Take 1,000 mcg by mouth daily.     No current facility-administered medications for this visit.     Review of Systems : See HPI for pertinent positives and negatives.  Physical Examination  Vitals:   12/01/16 1520 12/01/16 1521  BP: 123/69 118/69  Pulse: (!) 56   Resp: 18   Temp: (!) 97.3 F (36.3 C)   TempSrc: Oral   SpO2: 97%   Weight: 204 lb (92.5 kg)   Height:  (1.854  m)    Body mass index is 26.91 kg/m.  General: WDWN male in NAD GAIT:normal Eyes: PERRLA Pulmonary: Respirations are non-labored, CTAB, no Rales, no rhonchi, &no wheezing.  Cardiac: regular rhythm, bradycardic rate (on a beta blocker), no detected murmur.  VASCULAR EXAM Carotid Bruits Right Left   Negative Negative  Aorta is not palpable. Radial pulses are 2+ palpable and equal.   LE Pulses Right Left  POPLITEAL not palpable not palpable  POSTERIOR TIBIAL palpable palpable  DORSALIS PEDIS ANTERIOR TIBIAL palpable palpable    Gastrointestinal: soft, nontender, BS WNL, no r/g, no palpable masses.  Musculoskeletal: no muscle atrophy/wasting. M/S 5/5 throughout, extremities  without ischemic changes.  Neurologic: A&O X 3; Appropriate Affect, Speech is normal CN 2-12 intact, pain and light touch intact in extremities, Motor exam as listed above.    Assessment: Jeffrey [name] is a 61 y.o. male who presents with currently asymptomatic known occlusion of the left ICA. Left retinal hemorrhage was noted by his ophthalmologist, which in turn led to evaluation of his carotid arteries. He has not had any stroke or TIA symptoms other than left eye blurred vision years before the left retinal hemorrhage was discovered. He does not seem to have residual vision issues.     Pt needs to be on aspirin or Plavix chronically as indicated in Dr. Hart Rochester progress note from 11/19/2013.   DATA Carotid Duplex (12/01/16): Right ICA: 40-59% stenosis in the mid to distal segment, cannot rule out fibromuscular dysplasia. Left ICA: remains occluded. Bilateral vertebral artery flow is antegrade.  Bilateral subclavian artery waveforms are normal.  Increased diastolic velocity in the right mid to distal ICA since the exam on 11-26-15.    Plan: Follow-up in 1 year with Carotid Duplex scan.   I discussed in depth with the patient the nature of atherosclerosis, and emphasized the importance of maximal medical management including strict control of blood pressure, blood glucose, and lipid levels, obtaining regular exercise, and continued cessation of smoking.  The patient is aware that without maximal medical management the underlying atherosclerotic disease process will progress, limiting the benefit of any interventions. The patient was given information about stroke prevention and what symptoms should prompt the patient to seek immediate medical care. Thank you for allowing [place] to participate in this patient's care.  Charisse March, RN, MSN, FNP-C [facility] Office: [phone]  Clinic Physician: Darrick Penna  12/01/16 3:23 PM

## 2017-01-16 DIAGNOSIS — J209 Acute bronchitis, unspecified: Secondary | ICD-10-CM | POA: Diagnosis not present

## 2017-01-24 DIAGNOSIS — H3562 Retinal hemorrhage, left eye: Secondary | ICD-10-CM | POA: Diagnosis not present

## 2017-01-24 DIAGNOSIS — H3582 Retinal ischemia: Secondary | ICD-10-CM | POA: Diagnosis not present

## 2017-01-24 DIAGNOSIS — H353132 Nonexudative age-related macular degeneration, bilateral, intermediate dry stage: Secondary | ICD-10-CM | POA: Diagnosis not present

## 2017-02-01 NOTE — Addendum Note (Signed)
Addended by: Burton ApleyPETTY, Sheena Donegan A on: 02/01/2017 11:28 AM   Modules accepted: Orders

## 2017-03-10 DIAGNOSIS — I2510 Atherosclerotic heart disease of native coronary artery without angina pectoris: Secondary | ICD-10-CM | POA: Diagnosis not present

## 2017-03-10 DIAGNOSIS — E785 Hyperlipidemia, unspecified: Secondary | ICD-10-CM | POA: Diagnosis not present

## 2017-03-10 DIAGNOSIS — I6522 Occlusion and stenosis of left carotid artery: Secondary | ICD-10-CM | POA: Diagnosis not present

## 2017-05-12 DIAGNOSIS — J0100 Acute maxillary sinusitis, unspecified: Secondary | ICD-10-CM | POA: Diagnosis not present

## 2017-06-19 DIAGNOSIS — J0100 Acute maxillary sinusitis, unspecified: Secondary | ICD-10-CM | POA: Diagnosis not present

## 2017-07-20 DIAGNOSIS — J0100 Acute maxillary sinusitis, unspecified: Secondary | ICD-10-CM | POA: Diagnosis not present

## 2017-07-21 DIAGNOSIS — H353132 Nonexudative age-related macular degeneration, bilateral, intermediate dry stage: Secondary | ICD-10-CM | POA: Diagnosis not present

## 2017-07-21 DIAGNOSIS — H3562 Retinal hemorrhage, left eye: Secondary | ICD-10-CM | POA: Diagnosis not present

## 2017-09-20 DIAGNOSIS — I6522 Occlusion and stenosis of left carotid artery: Secondary | ICD-10-CM | POA: Diagnosis not present

## 2017-09-20 DIAGNOSIS — I2510 Atherosclerotic heart disease of native coronary artery without angina pectoris: Secondary | ICD-10-CM | POA: Diagnosis not present

## 2017-09-20 DIAGNOSIS — E7849 Other hyperlipidemia: Secondary | ICD-10-CM | POA: Diagnosis not present

## 2017-11-01 DIAGNOSIS — J0100 Acute maxillary sinusitis, unspecified: Secondary | ICD-10-CM | POA: Diagnosis not present

## 2018-01-18 DIAGNOSIS — Z13220 Encounter for screening for lipoid disorders: Secondary | ICD-10-CM | POA: Diagnosis not present

## 2018-01-18 DIAGNOSIS — Z6827 Body mass index (BMI) 27.0-27.9, adult: Secondary | ICD-10-CM | POA: Diagnosis not present

## 2018-01-18 DIAGNOSIS — Z0000 Encounter for general adult medical examination without abnormal findings: Secondary | ICD-10-CM | POA: Diagnosis not present

## 2018-01-23 DIAGNOSIS — H3562 Retinal hemorrhage, left eye: Secondary | ICD-10-CM | POA: Diagnosis not present

## 2018-01-23 DIAGNOSIS — H3530 Unspecified macular degeneration: Secondary | ICD-10-CM | POA: Diagnosis not present

## 2018-01-23 DIAGNOSIS — H353132 Nonexudative age-related macular degeneration, bilateral, intermediate dry stage: Secondary | ICD-10-CM | POA: Diagnosis not present

## 2018-03-08 DIAGNOSIS — J0100 Acute maxillary sinusitis, unspecified: Secondary | ICD-10-CM | POA: Diagnosis not present

## 2018-03-08 DIAGNOSIS — J209 Acute bronchitis, unspecified: Secondary | ICD-10-CM | POA: Diagnosis not present

## 2018-04-18 DIAGNOSIS — Z6827 Body mass index (BMI) 27.0-27.9, adult: Secondary | ICD-10-CM | POA: Diagnosis not present

## 2018-04-18 DIAGNOSIS — K219 Gastro-esophageal reflux disease without esophagitis: Secondary | ICD-10-CM | POA: Diagnosis not present

## 2018-04-18 DIAGNOSIS — J069 Acute upper respiratory infection, unspecified: Secondary | ICD-10-CM | POA: Diagnosis not present

## 2018-05-16 DIAGNOSIS — J343 Hypertrophy of nasal turbinates: Secondary | ICD-10-CM | POA: Diagnosis not present

## 2018-05-16 DIAGNOSIS — R1012 Left upper quadrant pain: Secondary | ICD-10-CM | POA: Diagnosis not present

## 2018-05-16 DIAGNOSIS — R0981 Nasal congestion: Secondary | ICD-10-CM | POA: Diagnosis not present

## 2018-05-16 DIAGNOSIS — J342 Deviated nasal septum: Secondary | ICD-10-CM | POA: Diagnosis not present

## 2018-05-21 DIAGNOSIS — J329 Chronic sinusitis, unspecified: Secondary | ICD-10-CM | POA: Diagnosis not present

## 2018-05-21 DIAGNOSIS — K8020 Calculus of gallbladder without cholecystitis without obstruction: Secondary | ICD-10-CM | POA: Diagnosis not present

## 2018-05-21 DIAGNOSIS — R1012 Left upper quadrant pain: Secondary | ICD-10-CM | POA: Diagnosis not present

## 2018-05-28 ENCOUNTER — Encounter (HOSPITAL_COMMUNITY): Payer: 59

## 2018-05-28 ENCOUNTER — Ambulatory Visit: Payer: 59 | Admitting: Family

## 2018-05-29 DIAGNOSIS — R0981 Nasal congestion: Secondary | ICD-10-CM | POA: Diagnosis not present

## 2018-05-29 DIAGNOSIS — J342 Deviated nasal septum: Secondary | ICD-10-CM | POA: Diagnosis not present

## 2018-05-29 DIAGNOSIS — J343 Hypertrophy of nasal turbinates: Secondary | ICD-10-CM | POA: Diagnosis not present

## 2018-06-20 HISTORY — PX: LEFT HEART CATH: CATH118248

## 2019-03-12 ENCOUNTER — Ambulatory Visit: Payer: 59 | Admitting: Neurology

## 2019-04-10 ENCOUNTER — Other Ambulatory Visit: Payer: Self-pay | Admitting: *Deleted

## 2019-04-10 ENCOUNTER — Encounter: Payer: Self-pay | Admitting: *Deleted

## 2019-04-14 ENCOUNTER — Ambulatory Visit: Payer: 59

## 2019-04-15 ENCOUNTER — Ambulatory Visit: Payer: 59 | Admitting: Diagnostic Neuroimaging

## 2019-04-30 ENCOUNTER — Ambulatory Visit: Payer: 59

## 2020-06-19 ENCOUNTER — Ambulatory Visit (HOSPITAL_COMMUNITY)
Admission: RE | Admit: 2020-06-19 | Discharge: 2020-06-19 | Disposition: A | Discharge status: Home / Self Care | Payer: 59 | Source: Ambulatory Visit | Attending: Orthopedic Surgery | Admitting: Orthopedic Surgery

## 2020-06-19 ENCOUNTER — Encounter (HOSPITAL_COMMUNITY)
Admission: RE | Disposition: A | Discharge status: Home / Self Care | Payer: Self-pay | Source: Ambulatory Visit | Attending: Orthopedic Surgery

## 2020-06-19 ENCOUNTER — Other Ambulatory Visit: Payer: Self-pay | Admitting: Orthopedic Surgery

## 2020-06-19 ENCOUNTER — Other Ambulatory Visit: Payer: Self-pay

## 2020-06-19 ENCOUNTER — Ambulatory Visit (HOSPITAL_COMMUNITY): Payer: 59 | Admitting: Certified Registered Nurse Anesthetist

## 2020-06-19 ENCOUNTER — Encounter (HOSPITAL_COMMUNITY): Payer: Self-pay | Admitting: Orthopedic Surgery

## 2020-06-19 DIAGNOSIS — M009 Pyogenic arthritis, unspecified: Secondary | ICD-10-CM

## 2020-06-19 DIAGNOSIS — Z955 Presence of coronary angioplasty implant and graft: Secondary | ICD-10-CM | POA: Insufficient documentation

## 2020-06-19 DIAGNOSIS — Z79899 Other long term (current) drug therapy: Secondary | ICD-10-CM | POA: Diagnosis not present

## 2020-06-19 DIAGNOSIS — Z7901 Long term (current) use of anticoagulants: Secondary | ICD-10-CM | POA: Diagnosis not present

## 2020-06-19 DIAGNOSIS — Z87891 Personal history of nicotine dependence: Secondary | ICD-10-CM | POA: Insufficient documentation

## 2020-06-19 DIAGNOSIS — Z20822 Contact with and (suspected) exposure to covid-19: Secondary | ICD-10-CM | POA: Insufficient documentation

## 2020-06-19 HISTORY — PX: I & D EXTREMITY: SHX5045

## 2020-06-19 LAB — SARS CORONAVIRUS 2 BY RT PCR (HOSPITAL ORDER, PERFORMED IN ~~LOC~~ HOSPITAL LAB): SARS Coronavirus 2: NEGATIVE

## 2020-06-19 SURGERY — IRRIGATION AND DEBRIDEMENT EXTREMITY
Anesthesia: General | Laterality: Right

## 2020-06-19 MED ORDER — PROPOFOL 10 MG/ML IV BOLUS
INTRAVENOUS | Status: AC
  Filled 2020-06-19: qty 20

## 2020-06-19 MED ORDER — LACTATED RINGERS IV SOLN: INTRAVENOUS | Status: DC

## 2020-06-19 MED ORDER — ROCURONIUM BROMIDE 10 MG/ML (PF) SYRINGE
PREFILLED_SYRINGE | INTRAVENOUS | Status: AC
  Filled 2020-06-19: qty 10

## 2020-06-19 MED ORDER — 0.9 % SODIUM CHLORIDE (POUR BTL) OPTIME
TOPICAL | Status: DC | PRN
  Administered 2020-06-19: 1000 mL

## 2020-06-19 MED ORDER — SODIUM CHLORIDE 0.9 % IR SOLN
Status: DC | PRN
  Administered 2020-06-19: 3000 mL

## 2020-06-19 MED ORDER — LIDOCAINE 2% (20 MG/ML) 5 ML SYRINGE
INTRAMUSCULAR | Status: DC | PRN
  Administered 2020-06-19: 60 mg via INTRAVENOUS

## 2020-06-19 MED ORDER — FENTANYL CITRATE (PF) 250 MCG/5ML IJ SOLN
INTRAMUSCULAR | Status: AC
  Filled 2020-06-19: qty 5

## 2020-06-19 MED ORDER — DEXAMETHASONE SODIUM PHOSPHATE 10 MG/ML IJ SOLN
INTRAMUSCULAR | Status: DC | PRN
  Administered 2020-06-19: 4 mg via INTRAVENOUS

## 2020-06-19 MED ORDER — BUPIVACAINE HCL (PF) 0.25 % IJ SOLN
INTRAMUSCULAR | Status: DC | PRN
  Administered 2020-06-19: 9 mL

## 2020-06-19 MED ORDER — BUPIVACAINE HCL (PF) 0.25 % IJ SOLN
INTRAMUSCULAR | Status: AC
  Filled 2020-06-19: qty 30

## 2020-06-19 MED ORDER — ONDANSETRON HCL 4 MG/2ML IJ SOLN
INTRAMUSCULAR | Status: DC | PRN
  Administered 2020-06-19: 4 mg via INTRAVENOUS

## 2020-06-19 MED ORDER — CELECOXIB 200 MG PO CAPS
200.0000 mg | ORAL_CAPSULE | Freq: Once | ORAL | Status: AC
  Administered 2020-06-19: 200 mg via ORAL
  Filled 2020-06-19: qty 1

## 2020-06-19 MED ORDER — CEFAZOLIN SODIUM-DEXTROSE 2-4 GM/100ML-% IV SOLN
2.0000 g | INTRAVENOUS | Status: AC
  Administered 2020-06-19: 2 g via INTRAVENOUS
  Filled 2020-06-19: qty 100

## 2020-06-19 MED ORDER — MIDAZOLAM HCL 2 MG/2ML IJ SOLN
INTRAMUSCULAR | Status: DC | PRN
  Administered 2020-06-19: 2 mg via INTRAVENOUS

## 2020-06-19 MED ORDER — ONDANSETRON HCL 4 MG/2ML IJ SOLN
INTRAMUSCULAR | Status: AC
  Filled 2020-06-19: qty 2

## 2020-06-19 MED ORDER — LIDOCAINE 2% (20 MG/ML) 5 ML SYRINGE
INTRAMUSCULAR | Status: AC
  Filled 2020-06-19: qty 5

## 2020-06-19 MED ORDER — FENTANYL CITRATE (PF) 250 MCG/5ML IJ SOLN
INTRAMUSCULAR | Status: DC | PRN
  Administered 2020-06-19: 100 ug via INTRAVENOUS
  Administered 2020-06-19: 50 ug via INTRAVENOUS

## 2020-06-19 MED ORDER — MIDAZOLAM HCL 2 MG/2ML IJ SOLN
INTRAMUSCULAR | Status: AC
  Filled 2020-06-19: qty 2

## 2020-06-19 MED ORDER — PROPOFOL 10 MG/ML IV BOLUS
INTRAVENOUS | Status: DC | PRN
  Administered 2020-06-19: 200 mg via INTRAVENOUS

## 2020-06-19 MED ORDER — CHLORHEXIDINE GLUCONATE 0.12 % MT SOLN
15.0000 mL | OROMUCOSAL | Status: AC
  Administered 2020-06-19: 15 mL via OROMUCOSAL
  Filled 2020-06-19: qty 15

## 2020-06-19 MED ORDER — DEXAMETHASONE SODIUM PHOSPHATE 10 MG/ML IJ SOLN
INTRAMUSCULAR | Status: AC
  Filled 2020-06-19: qty 1

## 2020-06-19 MED ORDER — ACETAMINOPHEN 500 MG PO TABS
1000.0000 mg | ORAL_TABLET | Freq: Once | ORAL | Status: AC
  Administered 2020-06-19: 1000 mg via ORAL
  Filled 2020-06-19: qty 2

## 2020-06-19 MED ORDER — CHLORHEXIDINE GLUCONATE 0.12 % MT SOLN
15.0000 mL | OROMUCOSAL | Status: DC
  Filled 2020-06-19 (×2): qty 15

## 2020-06-19 SURGICAL SUPPLY — 57 items
ADAPTER CATH SYR TO TUBING 38M (ADAPTER) ×2 IMPLANT
ADPR CATH LL SYR 3/32 TPR (ADAPTER) ×1
BNDG CMPR 9X4 STRL LF SNTH (GAUZE/BANDAGES/DRESSINGS)
BNDG COHESIVE 1X5 TAN STRL LF (GAUZE/BANDAGES/DRESSINGS) ×2 IMPLANT
BNDG COHESIVE 2X5 TAN STRL LF (GAUZE/BANDAGES/DRESSINGS) IMPLANT
BNDG ELASTIC 3X5.8 VLCR STR LF (GAUZE/BANDAGES/DRESSINGS) ×2 IMPLANT
BNDG ELASTIC 4X5.8 VLCR STR LF (GAUZE/BANDAGES/DRESSINGS) ×2 IMPLANT
BNDG ESMARK 4X9 LF (GAUZE/BANDAGES/DRESSINGS) IMPLANT
BNDG GAUZE ELAST 4 BULKY (GAUZE/BANDAGES/DRESSINGS) ×2 IMPLANT
CANNULA VESSEL 3MM 2 BLNT TIP (CANNULA) IMPLANT
CORD BIPOLAR FORCEPS 12FT (ELECTRODE) ×2 IMPLANT
COVER SURGICAL LIGHT HANDLE (MISCELLANEOUS) ×2 IMPLANT
COVER WAND RF STERILE (DRAPES) ×2 IMPLANT
CUFF TOURN SGL QUICK 18X4 (TOURNIQUET CUFF) IMPLANT
CUFF TOURN SGL QUICK 24 (TOURNIQUET CUFF)
CUFF TRNQT CYL 24X4X16.5-23 (TOURNIQUET CUFF) IMPLANT
DECANTER SPIKE VIAL GLASS SM (MISCELLANEOUS) ×2 IMPLANT
DRAIN PENROSE 1/4X12 LTX STRL (WOUND CARE) IMPLANT
DRSG PAD ABDOMINAL 8X10 ST (GAUZE/BANDAGES/DRESSINGS) ×4 IMPLANT
GAUZE PACKING IODOFORM 1/4X15 (PACKING) ×2 IMPLANT
GAUZE SPONGE 4X4 12PLY STRL (GAUZE/BANDAGES/DRESSINGS) ×2 IMPLANT
GAUZE XEROFORM 1X8 LF (GAUZE/BANDAGES/DRESSINGS) ×2 IMPLANT
GLOVE BIO SURGEON STRL SZ7.5 (GLOVE) ×2 IMPLANT
GLOVE BIOGEL PI IND STRL 8.5 (GLOVE) IMPLANT
GLOVE BIOGEL PI INDICATOR 8.5 (GLOVE)
GLOVE BIOGEL PI ORTHO PRO SZ8 (GLOVE)
GLOVE PI ORTHO PRO STRL SZ8 (GLOVE) IMPLANT
GLOVE SRG 8 PF TXTR STRL LF DI (GLOVE) ×1 IMPLANT
GLOVE SURG ORTHO 8.0 STRL STRW (GLOVE) IMPLANT
GLOVE SURG UNDER POLY LF SZ8 (GLOVE) ×2
GOWN STRL REUS W/ TWL LRG LVL3 (GOWN DISPOSABLE) ×1 IMPLANT
GOWN STRL REUS W/ TWL XL LVL3 (GOWN DISPOSABLE) ×1 IMPLANT
GOWN STRL REUS W/TWL LRG LVL3 (GOWN DISPOSABLE) ×2
GOWN STRL REUS W/TWL XL LVL3 (GOWN DISPOSABLE) ×2
KIT BASIN OR (CUSTOM PROCEDURE TRAY) ×2 IMPLANT
KIT TURNOVER KIT B (KITS) ×2 IMPLANT
LOOP VESSEL MAXI BLUE (MISCELLANEOUS) IMPLANT
MANIFOLD NEPTUNE II (INSTRUMENTS) IMPLANT
NEEDLE HYPO 25X1 1.5 SAFETY (NEEDLE) IMPLANT
NS IRRIG 1000ML POUR BTL (IV SOLUTION) ×2 IMPLANT
PACK ORTHO EXTREMITY (CUSTOM PROCEDURE TRAY) ×2 IMPLANT
PAD ARMBOARD 7.5X6 YLW CONV (MISCELLANEOUS) ×4 IMPLANT
SET CYSTO W/LG BORE CLAMP LF (SET/KITS/TRAYS/PACK) IMPLANT
SOL PREP POV-IOD 4OZ 10% (MISCELLANEOUS) ×4 IMPLANT
SPONGE LAP 4X18 RFD (DISPOSABLE) ×2 IMPLANT
SUT ETHILON 4 0 P 3 18 (SUTURE) IMPLANT
SUT ETHILON 4 0 PS 2 18 (SUTURE) IMPLANT
SUT MON AB 5-0 P3 18 (SUTURE) IMPLANT
SWAB COLLECTION DEVICE MRSA (MISCELLANEOUS) IMPLANT
SWAB CULTURE ESWAB REG 1ML (MISCELLANEOUS) IMPLANT
SYR 20ML LL LF (SYRINGE) ×2 IMPLANT
SYR CONTROL 10ML LL (SYRINGE) IMPLANT
TOWEL GREEN STERILE (TOWEL DISPOSABLE) ×2 IMPLANT
TUBE CONNECTING 12X1/4 (SUCTIONS) ×2 IMPLANT
TUBE FEEDING ENTERAL 5FR 16IN (TUBING) IMPLANT
UNDERPAD 30X36 HEAVY ABSORB (UNDERPADS AND DIAPERS) ×2 IMPLANT
YANKAUER SUCT BULB TIP NO VENT (SUCTIONS) ×2 IMPLANT

## 2020-06-19 NOTE — Anesthesia Postprocedure Evaluation (Signed)
Anesthesia Post Note  Patient: Jeffrey [name]  Procedure(s) Performed: IRRIGATION AND DEBRIDEMENT LONG FINGER (Right )     Patient location during evaluation: PACU Anesthesia Type: General Level of consciousness: awake Pain management: pain level controlled Vital Signs Assessment: post-procedure vital signs reviewed and stable Cardiovascular status: stable Postop Assessment: no apparent nausea or vomiting Anesthetic complications: no   No complications documented.  Last Vitals:  Vitals:   06/19/20 1930 06/19/20 1945  BP: (!) 145/71 140/68  Pulse: (!) 56 (!) 57  Resp: 13 14  Temp: 36.4 C   SpO2: 100% 99%    Last Pain:  Vitals:   06/19/20 1945  TempSrc:   PainSc: 0-No pain                 Ambre Kobayashi

## 2020-06-19 NOTE — Transfer of Care (Signed)
Immediate Anesthesia Transfer of Care Note  Patient: Jeffrey [name]  Procedure(s) Performed: IRRIGATION AND DEBRIDEMENT LONG FINGER (Right )  Patient Location: PACU  Anesthesia Type:General  Level of Consciousness: awake, alert , oriented and patient cooperative  Airway & Oxygen Therapy: Patient Spontanous Breathing and Patient connected to nasal cannula oxygen  Post-op Assessment: Report given to RN and Post -op Vital signs reviewed and stable  Post vital signs: Reviewed and stable  Last Vitals:  Vitals Value Taken Time  BP 145/71 06/19/20 1928  Temp    Pulse 56 06/19/20 1931  Resp 13 06/19/20 1931  SpO2 100 % 06/19/20 1931  Vitals shown include unvalidated device data.  Last Pain:  Vitals:   06/19/20 1516  TempSrc:   PainSc: 7       Patients Stated Pain Goal: 3 (06/19/20 1516)  Complications: No complications documented.

## 2020-06-19 NOTE — Anesthesia Preprocedure Evaluation (Addendum)
Anesthesia Evaluation  Patient identified by MRN, date of birth, ID band Patient awake    Reviewed: Allergy & Precautions, H&P , NPO status , Patient's Chart, lab work & pertinent test results  Airway Mallampati: III  TM Distance: >3 FB Neck ROM: Full    Dental no notable dental hx. (+) Partial Upper, Dental Advisory Given   Pulmonary former smoker,    Pulmonary exam normal breath sounds clear to auscultation       Cardiovascular + CAD and + Cardiac Stents   Rhythm:Regular Rate:Normal     Neuro/Psych negative neurological ROS  negative psych ROS   GI/Hepatic negative GI ROS, Neg liver ROS,   Endo/Other  negative endocrine ROS  Renal/GU negative Renal ROS  negative genitourinary   Musculoskeletal   Abdominal   Peds  Hematology negative hematology ROS (+)   Anesthesia Other Findings   Reproductive/Obstetrics negative OB ROS                           Anesthesia Physical Anesthesia Plan  ASA: III  Anesthesia Plan: General   Post-op Pain Management:    Induction: Intravenous  PONV Risk Score and Plan: 3 and Ondansetron, Dexamethasone and Treatment may vary due to age or medical condition  Airway Management Planned: LMA  Additional Equipment:   Intra-op Plan:   Post-operative Plan: Extubation in OR  Informed Consent: I have reviewed the patients History and Physical, chart, labs and discussed the procedure including the risks, benefits and alternatives for the proposed anesthesia with the patient or authorized representative who has indicated his/her understanding and acceptance.     Dental advisory given  Plan Discussed with: CRNA  Anesthesia Plan Comments:         Anesthesia Quick Evaluation

## 2020-06-19 NOTE — Discharge Instructions (Signed)

## 2020-06-19 NOTE — Anesthesia Procedure Notes (Signed)
Procedure Name: LMA Insertion Date/Time: 06/19/2020 6:39 PM Performed by: De Nurse, CRNA Pre-anesthesia Checklist: Patient identified, Emergency Drugs available, Suction available and Patient being monitored Patient Re-evaluated:Patient Re-evaluated prior to induction Oxygen Delivery Method: Circle System Utilized Preoxygenation: Pre-oxygenation with 100% oxygen Induction Type: IV induction Ventilation: Mask ventilation without difficulty LMA: LMA inserted LMA Size: 5.0 Number of attempts: 1 Placement Confirmation: positive ETCO2 Tube secured with: Tape Dental Injury: Teeth and Oropharynx as per pre-operative assessment

## 2020-06-19 NOTE — Op Note (Addendum)
NAME: Jeffrey [name] [facility] MEDICAL RECORD NO: [id number] DATE OF BIRTH: 1955/06/01 FACILITY: [facility] LOCATION: [facility] OR PHYSICIAN: Tami Ribas, MD   OPERATIVE REPORT   DATE OF PROCEDURE: 06/19/20    PREOPERATIVE DIAGNOSIS:   Right long finger DIP joint abscess   POSTOPERATIVE DIAGNOSIS:   Right long finger DIP joint abscess   PROCEDURE:   Incision and drainage right long finger DIP joint   SURGEON:  Betha Loa, M.D.   ASSISTANT: none   ANESTHESIA:  General   INTRAVENOUS FLUIDS:  Per anesthesia flow sheet.   ESTIMATED BLOOD LOSS:  Minimal.   COMPLICATIONS:  None.   SPECIMENS:   Cultures to micro, tissue to pathology   TOURNIQUET TIME:   Right arm: 23 minutes at 250 mmHg   DISPOSITION:  Stable to PACU.  Debridement type: Excisional Debridement  Side: right  Body Location: long finger   Tools used for debridement: freer elevator, pick ups  Debridement depth beyond dead/damaged tissue down to healthy viable tissue: no  Tissue layer involved: skin, subcutaneous tissue, muscle / fascia  Nature of tissue removed: Purulence  Irrigation fluid type: Normal Saline    INDICATIONS: 65 year old male states he was bitten by his dog  9 days ago.  He subsequently had to help deliver a calf.  He has had progressive swelling and pain of the right long finger DIP joint over the past few days.  He has been on doxycycline and more recently Augmentin without relief.  Recommended incision and drainage in the operating room.  Risks, benefits and alternatives of surgery were discussed including the risks of blood loss, infection, damage to nerves, vessels, tendons, ligaments, bone for surgery, need for additional surgery, complications with wound healing, continued pain, stiffness, need for repeat irrigation and debridement.  He voiced understanding of these risks and elected to proceed.  OPERATIVE COURSE:  After being identified preoperatively by myself,  the patient and I agreed on the  procedure and site of the procedure.  The surgical site was marked.  Surgical consent had been signed. He was given IV Ancef preoperatively. He was transferred to the operating room and placed on the operating table in supine position with the Right upper extremity on an arm board.  General anesthesia was induced by the anesthesiologist.  Right upper extremity was prepped and draped in normal sterile orthopedic fashion.  A surgical pause was performed between the surgeons, anesthesia, and operating room staff and all were in agreement as to the patient, procedure, and site of procedure.  Tourniquet at the proximal aspect of the extremity was inflated to 250 mmHg after exsanguination of the arm with an Esmarch bandage.    A hockey-stick shaped incision was made over the DIP joint of the long finger.  This was carried in subcutaneous tissues by spreading technique.  Bipolar electrocautery was used to obtain hemostasis.  The DIP joint was entered underneath the extensor tendon.  There was some cloudy fluid.  Cultures were taken for aerobes and anaerobes.  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  These were removed and sent to pathology for examination.  The wound and joint were copiously irrigated with sterile saline by cystoscopy tubing and vessel cannula.  The joint was probed.  There was no soft bone.  Iodoform gauze was placed underneath the extensor tendon and the wound was packed with quarter inch iodoform gauze.  A digital block was performed with quarter percent plain Marcaine to aid in postoperative  analgesia.  Wound was dressed with sterile 4 x 4 and wrapped with a Coban dressing lightly.  An AlumaFoam splint was placed and wrapped lightly with Coban dressing.  The tourniquet was deflated at 23 minutes.  Fingertips were pink with brisk capillary refill after deflation of tourniquet.  The operative  drapes were broken down.  The patient was awoken from anesthesia safely.  He was  transferred back to the stretcher and taken to PACU in stable condition.  I will see him back in the office in 3-4 days for postoperative followup.  I will give him a prescription for Norco 5/325 1-2 tabs PO q6 hours prn pain, dispense # 20.  He will continue with his current Augmentin prescription.   Betha Loa, MD Electronically signed, 06/19/20   Addendum (06/25/20): Clarify debridement.

## 2020-06-19 NOTE — H&P (Signed)
Jeffrey [name] is an 65 y.o. male.   Chief Complaint: finger infection HPI: 65 year old right-hand-dominant male previous present with his wife. He states he was bitten by his dog on June 10, 2020 on the right long finger. He then had to help MRSA calf. He has had increased swelling and pain of the right long finger. No fevers chills or night sweats. He describes a throbbing pain that has been worsening. He was started on doxycycline on April 8 and Augmentin on April 12. He has been doing salt water soaks.    Allergies:  Allergies  Allergen Reactions  . Shellfish-Derived Products Other (See Comments)    GI upset- "this tears up my stomach"    Past Medical History:  Diagnosis Date  . Aneurysm of infrarenal abdominal aorta (HCC)   . CAD (coronary artery disease)    4 stents  . Carotid artery disease (HCC)    Left ICA  . Hyperlipidemia   . Intracerebral hemorrhage (HCC) 1960  . Peripheral neuropathy     Past Surgical History:  Procedure Laterality Date  . APPENDECTOMY  1960  . LEFT HEART CATH  06/20/2018  . ORIF FEMUR FRACTURE  1988    Family History: Family History  Problem Relation Age of Onset  . Vision loss Mother   . Diabetes Mother   . Crohn's disease Father     Social History:   reports that he quit smoking about 7 years ago. His smoking use included cigarettes. He has never used smokeless tobacco. He reports that he does not drink alcohol and does not use drugs.  Medications: Medications Prior to Admission  Medication Sig Dispense Refill  . acetaminophen (TYLENOL) 500 MG tablet Take 500-1,000 mg by mouth every 6 (six) hours as needed for mild pain or headache.    Marland Kitchen amoxicillin-clavulanate (AUGMENTIN) 875-125 MG tablet Take 1 tablet by mouth 2 (two) times daily.    Marland Kitchen apixaban (ELIQUIS) 5 MG TABS tablet Take 5 mg by mouth 2 (two) times daily.    Marland Kitchen atorvastatin (LIPITOR) 80 MG tablet Take 80 mg by mouth at bedtime.    . citalopram (CELEXA) 20 MG tablet Take 20  mg by mouth at bedtime.    . colchicine 0.6 MG tablet Take 0.6 mg by mouth as needed (as directed for gout flares).    . cyclobenzaprine (FLEXERIL) 10 MG tablet Take 5 mg by mouth at bedtime.    Marland Kitchen doxycycline (MONODOX) 100 MG capsule Take 100 mg by mouth 2 (two) times daily.    . famotidine (PEPCID) 20 MG tablet Take 20 mg by mouth daily as needed for heartburn or indigestion.    . ferrous sulfate 325 (65 FE) MG tablet Take 325 mg by mouth daily with breakfast.    . furosemide (LASIX) 20 MG tablet Take 20 mg by mouth in the morning.    Marland Kitchen lisinopril (ZESTRIL) 5 MG tablet Take 5 mg by mouth daily.    Marland Kitchen loratadine (CLARITIN) 10 MG tablet Take 10 mg by mouth daily as needed for allergies or rhinitis.    . metoprolol tartrate (LOPRESSOR) 25 MG tablet Take 12.5 mg by mouth 2 (two) times daily.  5  . Multiple Vitamins-Minerals (PRESERVISION AREDS 2) CHEW Chew 1 capsule by mouth in the morning and at bedtime.    . multivitamin (ONE-A-DAY MEN'S) TABS tablet Take 1 tablet by mouth daily.    . nitroGLYCERIN (NITROSTAT) 0.4 MG SL tablet Place 0.4 mg under the tongue every 5 (five) minutes  as needed for chest pain.    . Omega-3 Fatty Acids (FISH OIL) 1000 MG CAPS Take 1,000 mg by mouth in the morning.    . ranolazine (RANEXA) 500 MG 12 hr tablet Take 500 mg by mouth 2 (two) times daily.      Results for orders placed or performed during the hospital encounter of 06/19/20 (from the past 48 hour(s))  SARS Coronavirus 2 by RT PCR (hospital order, performed in [facility] hospital lab) Nasopharyngeal Nasopharyngeal Swab     Status: None   Collection Time: 06/19/20  1:52 PM   Specimen: Nasopharyngeal Swab  Result Value Ref Range   SARS Coronavirus 2 NEGATIVE NEGATIVE    Comment: (NOTE) SARS-CoV-2 target nucleic acids are NOT DETECTED.  The SARS-CoV-2 RNA is generally detectable in upper and lower respiratory specimens during the acute phase of infection. The lowest concentration of SARS-CoV-2 viral copies  this assay can detect is 250 copies / mL. A negative result does not preclude SARS-CoV-2 infection and should not be used as the sole basis for treatment or other patient management decisions.  A negative result may occur with improper specimen collection / handling, submission of specimen other than nasopharyngeal swab, presence of viral mutation(s) within the areas targeted by this assay, and inadequate number of viral copies (<250 copies / mL). A negative result must be combined with clinical observations, patient history, and epidemiological information.  Fact Sheet for Patients:   BoilerBrush.com.cy  Fact Sheet for Healthcare Providers: https://pope.com/  This test is not yet approved or  cleared by the [place] FDA and has been authorized for detection and/or diagnosis of SARS-CoV-2 by FDA under an Emergency Use Authorization (EUA).  This EUA will remain in effect (meaning this test can be used) for the duration of the COVID-19 declaration under Section 564(b)(1) of the Act, 21 U.S.C. section 360bbb-3(b)(1), unless the authorization is terminated or revoked sooner.  Performed at [facility], [address] [place], [place] [address]     No results found.   A comprehensive review of systems was negative.  Blood pressure (!) 153/79, pulse (!) 51, temperature (!) 97.5 F (36.4 C), temperature source Oral, resp. rate 18, height 6\' 1"  (1.854 m), weight 99.3 kg, SpO2 100 %.  General appearance: alert, cooperative and appears stated age Head: Normocephalic, without obvious abnormality, atraumatic Neck: supple, symmetrical, trachea midline Cardio: regular rate and rhythm Resp: clear to auscultation bilaterally Extremities: Intact sensation and capillary refill all digits.  +epl/fpl/io.  Swelling and erythema of right long finger distal phalanx and dip joint.  ttp dip joint Pulses: 2+ and symmetric Skin: Skin color,  texture, turgor normal. No rashes or lesions Neurologic: Grossly normal Incision/Wound: none  Assessment/Plan Right long finger dip joint infection.  Recommend incision and drainage in OR.  Non operative and operative treatment options have been discussed with the patient and patient wishes to proceed with operative treatment. Risks, benefits, and alternatives of surgery have been discussed and the patient agrees with the plan of care.   06/19/2020, 6:29 PM

## 2020-06-20 ENCOUNTER — Encounter (HOSPITAL_COMMUNITY): Payer: Self-pay | Admitting: Orthopedic Surgery

## 2020-06-24 ENCOUNTER — Encounter (HOSPITAL_COMMUNITY): Payer: Self-pay | Admitting: Orthopedic Surgery

## 2020-06-24 LAB — AEROBIC/ANAEROBIC CULTURE W GRAM STAIN (SURGICAL/DEEP WOUND)
Culture: NO GROWTH
Gram Stain: NONE SEEN

## 2020-06-25 LAB — AEROBIC/ANAEROBIC CULTURE W GRAM STAIN (SURGICAL/DEEP WOUND): Culture: NO GROWTH

## 2020-10-05 ENCOUNTER — Encounter: Payer: Self-pay | Admitting: Gastroenterology

## 2021-03-12 ENCOUNTER — Emergency Department (HOSPITAL_COMMUNITY)
Admission: EM | Admit: 2021-03-12 | Discharge: 2021-03-13 | Disposition: A | Discharge status: Home / Self Care | Payer: No Typology Code available for payment source | Attending: Emergency Medicine | Admitting: Emergency Medicine

## 2021-03-12 ENCOUNTER — Other Ambulatory Visit: Payer: Self-pay

## 2021-03-12 ENCOUNTER — Encounter (HOSPITAL_COMMUNITY): Payer: Self-pay | Admitting: *Deleted

## 2021-03-12 DIAGNOSIS — I4891 Unspecified atrial fibrillation: Secondary | ICD-10-CM | POA: Diagnosis present

## 2021-03-12 DIAGNOSIS — R0602 Shortness of breath: Secondary | ICD-10-CM | POA: Insufficient documentation

## 2021-03-12 DIAGNOSIS — R531 Weakness: Secondary | ICD-10-CM | POA: Diagnosis not present

## 2021-03-12 DIAGNOSIS — I2510 Atherosclerotic heart disease of native coronary artery without angina pectoris: Secondary | ICD-10-CM | POA: Insufficient documentation

## 2021-03-12 DIAGNOSIS — Z7901 Long term (current) use of anticoagulants: Secondary | ICD-10-CM | POA: Diagnosis not present

## 2021-03-12 NOTE — ED Provider Triage Note (Signed)
Emergency Medicine Provider Triage Evaluation Note  Jeffrey [name] , a 66 y.o. male  was evaluated in triage.  Pt complains of AFIB.  Symptoms started around 8-8:30PM tonight.  States has been feeling a bit tired recently, palpitations started tonight with some SOB.  Hx of AFRIB but usually in NSR.  Already took metoprolol and Eliquis tonight.  Followed by cardiology at the [facility] in [place].  Has required electric cardioversion in the past. HR currently around 110-120 during triage.  Review of Systems  Positive: AFIB Negative: Chest pain  Physical Exam  BP 133/81 (BP Location: Left Arm)    Pulse (!) 101    Temp 97.9 F (36.6 C) (Oral)    Resp 18    Ht 6\' 1"  (1.854 m)    Wt 99.3 kg    SpO2 99%    BMI 28.88 kg/m   Gen:   Awake, no distress   Resp:  Normal effort MSK:   Moves extremities without difficulty Other:  No LE edema, HR 110-120  Medical Decision Making  Medically screening exam initiated at 11:44 PM.  Appropriate orders placed.  Jeffrey [name] was informed that the remainder of the evaluation will be completed by another provider, this initial triage assessment does not replace that evaluation, and the importance of remaining in the ED until their evaluation is complete.  AFIB w/RVR.  Rate around 110-120 during triage.  No chest pain currently.  EKG, labs, CXR.     Larene Pickett, PA-C 03/13/21 0002

## 2021-03-12 NOTE — ED Triage Notes (Signed)
The pt arrived by [facility] from home  c/o weakness since 2030 hx of af   rapid heart rate  irregular

## 2021-03-13 ENCOUNTER — Emergency Department (HOSPITAL_COMMUNITY): Payer: No Typology Code available for payment source

## 2021-03-13 LAB — CBC WITH DIFFERENTIAL/PLATELET
Abs Immature Granulocytes: 0.02 10*3/uL (ref 0.00–0.07)
Basophils Absolute: 0.1 10*3/uL (ref 0.0–0.1)
Basophils Relative: 1 %
Eosinophils Absolute: 0.3 10*3/uL (ref 0.0–0.5)
Eosinophils Relative: 4 %
HCT: 37.5 % — ABNORMAL LOW (ref 39.0–52.0)
Hemoglobin: 12.8 g/dL — ABNORMAL LOW (ref 13.0–17.0)
Immature Granulocytes: 0 %
Lymphocytes Relative: 35 %
Lymphs Abs: 3 10*3/uL (ref 0.7–4.0)
MCH: 31.4 pg (ref 26.0–34.0)
MCHC: 34.1 g/dL (ref 30.0–36.0)
MCV: 92.1 fL (ref 80.0–100.0)
Monocytes Absolute: 0.8 10*3/uL (ref 0.1–1.0)
Monocytes Relative: 9 %
Neutro Abs: 4.4 10*3/uL (ref 1.7–7.7)
Neutrophils Relative %: 51 %
Platelets: 244 10*3/uL (ref 150–400)
RBC: 4.07 MIL/uL — ABNORMAL LOW (ref 4.22–5.81)
RDW: 13 % (ref 11.5–15.5)
WBC: 8.6 10*3/uL (ref 4.0–10.5)
nRBC: 0 % (ref 0.0–0.2)

## 2021-03-13 LAB — BASIC METABOLIC PANEL
Anion gap: 9 (ref 5–15)
BUN: 12 mg/dL (ref 8–23)
CO2: 27 mmol/L (ref 22–32)
Calcium: 9.5 mg/dL (ref 8.9–10.3)
Chloride: 102 mmol/L (ref 98–111)
Creatinine, Ser: 0.8 mg/dL (ref 0.61–1.24)
GFR, Estimated: >60 mL/min (ref >60)
Glucose, Bld: 142 mg/dL — ABNORMAL HIGH (ref 70–99)
Potassium: 3.4 mmol/L — ABNORMAL LOW (ref 3.5–5.1)
Sodium: 138 mmol/L (ref 135–145)

## 2021-03-13 LAB — TROPONIN I (HIGH SENSITIVITY)
Troponin I (High Sensitivity): 20 ng/L — ABNORMAL HIGH (ref <18)
Troponin I (High Sensitivity): 26 ng/L — ABNORMAL HIGH (ref <18)

## 2021-03-13 LAB — BRAIN NATRIURETIC PEPTIDE: B Natriuretic Peptide: 109.6 pg/mL — ABNORMAL HIGH (ref 0.0–100.0)

## 2021-03-13 LAB — MAGNESIUM: Magnesium: 1.7 mg/dL (ref 1.7–2.4)

## 2021-03-13 MED ORDER — METOPROLOL TARTRATE 5 MG/5ML IV SOLN
5.0000 mg | Freq: Once | INTRAVENOUS | Status: AC
  Administered 2021-03-13: 5 mg via INTRAVENOUS
  Filled 2021-03-13: qty 5

## 2021-03-13 MED ORDER — POTASSIUM CHLORIDE CRYS ER 20 MEQ PO TBCR
20.0000 meq | EXTENDED_RELEASE_TABLET | Freq: Once | ORAL | Status: AC
  Administered 2021-03-13: 20 meq via ORAL
  Filled 2021-03-13: qty 1

## 2021-03-13 NOTE — Discharge Instructions (Addendum)
You were evaluated in the Emergency Department and after careful evaluation, we did not find any emergent condition requiring admission or further testing in the hospital.  Your exam/testing today was overall reassuring.  Urine atrial fibrillation and what appeared to be atrial flutter with rapid ventricular response that responded well to IV metoprolol x1.  After this medication, you converted to normal sinus rhythm.  You are safe to take your home dose of metoprolol and home Eliquis as scheduled tomorrow morning.  Follow-up with your cardiologist and PCP.  Return for worsening symptoms.  Please return to the Emergency Department if you experience any worsening of your condition.  Thank you for allowing [place] to be a part of your care.

## 2021-03-13 NOTE — ED Provider Notes (Signed)
[facility] EMERGENCY DEPARTMENT Provider Note   CSN: [id number] Arrival date & time: 03/12/21  2350     History  Chief Complaint  Patient presents with   Weakness   Atrial Fibrillation    Jeffrey [name] is a 66 y.o. male.   Weakness Atrial Fibrillation   66 year old male with a history significant for HLD, intracerebral hemorrhage, CAD, AAA, atrial fibrillation on Eliquis and metoprolol who presents to the emergency department with an uncontrolled heart rate.  The patient states that his symptoms started around 8 PM tonight.  He did take his home dose of Eliquis today.  He states that he felt generally weak and checked his heart rate and it was found to be fast and irregular.  He took an extra home dose of his metoprolol without improvement.  He called EMS and was transported to [facility] emergency department for further care management.  He denies any chest pain or shortness of breath.  Home Medications Prior to Admission medications   Medication Sig Start Date End Date Taking? Authorizing Provider  acetaminophen (TYLENOL) 325 MG tablet Take 650 mg by mouth every 6 (six) hours as needed for moderate pain or headache.   Yes [provider]  allopurinol (ZYLOPRIM) 100 MG tablet Take 100 mg by mouth daily. 01/22/21  Yes [provider]  amoxicillin-clavulanate (AUGMENTIN) 875-125 MG tablet Take 1 tablet by mouth See admin instructions. Bid x 10 days   Yes [provider]  apixaban (ELIQUIS) 5 MG TABS tablet Take 5 mg by mouth 2 (two) times daily.   Yes [provider]  atorvastatin (LIPITOR) 80 MG tablet Take 80 mg by mouth at bedtime. 02/24/17  Yes [provider]  benzonatate (TESSALON) 100 MG capsule Take 100 mg by mouth 2 (two) times daily as needed for cough. 03/04/21  Yes [provider]  citalopram (CELEXA) 20 MG tablet Take 10 mg by mouth daily. 06/17/20  Yes [provider]  colchicine 0.6 MG  tablet Take 0.6 mg by mouth as needed (as directed for gout flares).   Yes [provider]  cyclobenzaprine (FLEXERIL) 10 MG tablet Take 5 mg by mouth at bedtime. 06/17/20  Yes [provider]  famotidine (PEPCID) 20 MG tablet Take 20 mg by mouth 2 (two) times daily.   Yes [provider]  furosemide (LASIX) 20 MG tablet Take 20 mg by mouth in the morning. 11/26/18  Yes [provider]  gabapentin (NEURONTIN) 300 MG capsule Take 300 mg by mouth 2 (two) times daily as needed (pain). 09/17/20  Yes [provider]  HYDROcodone-acetaminophen (NORCO) 7.5-325 MG tablet Take 1 tablet by mouth 3 (three) times daily as needed for moderate pain. 09/17/20  Yes [provider]  lisinopril (ZESTRIL) 5 MG tablet Take 5 mg by mouth daily.   Yes [provider]  metoprolol tartrate (LOPRESSOR) 25 MG tablet Take 12.5 mg by mouth 2 (two) times daily. 11/16/14  Yes [provider]  Multiple Vitamins-Minerals (PRESERVISION AREDS 2) CAPS Chew 1 capsule by mouth in the morning and at bedtime.   Yes [provider]  nitroGLYCERIN (NITROSTAT) 0.4 MG SL tablet Place 0.4 mg under the tongue every 5 (five) minutes as needed for chest pain.   Yes [provider]  Omega-3 Fatty Acids (FISH OIL) 1000 MG CAPS Take 1,000 mg by mouth in the morning.   Yes [provider]  omeprazole (PRILOSEC) 20 MG capsule Take 20 mg by mouth daily.  Yes [provider]  ranolazine (RANEXA) 500 MG 12 hr tablet Take 500 mg by mouth 2 (two) times daily. 07/26/16  Yes [provider]  zinc gluconate 50 MG tablet Take 50 mg by mouth daily.   Yes [provider]  trimethoprim-polymyxin b (POLYTRIM) ophthalmic solution Place 1 drop into both eyes 4 (four) times daily. Patient not taking: Reported on 03/13/2021 03/04/21   [provider]      Allergies    Shellfish-derived products    Review of Systems   Review of Systems   Neurological:  Positive for weakness.   Physical Exam Updated Vital Signs BP 118/70    Pulse 62    Temp 97.9 F (36.6 C) (Oral)    Resp 14    Ht 6\' 1"  (1.854 m)    Wt 99.3 kg    SpO2 98%    BMI 28.88 kg/m  Physical Exam Vitals and nursing note reviewed.  Constitutional:      General: He is not in acute distress.    Appearance: He is well-developed.  HENT:     Head: Normocephalic and atraumatic.  Eyes:     Conjunctiva/sclera: Conjunctivae normal.     Pupils: Pupils are equal, round, and reactive to light.  Cardiovascular:     Rate and Rhythm: Tachycardia present. Rhythm irregular.     Heart sounds: No murmur heard. Pulmonary:     Effort: Pulmonary effort is normal. No respiratory distress.     Breath sounds: Normal breath sounds.  Abdominal:     General: There is no distension.     Palpations: Abdomen is soft.     Tenderness: There is no abdominal tenderness. There is no guarding.  Musculoskeletal:        General: No swelling, deformity or signs of injury.     Cervical back: Neck supple.  Skin:    General: Skin is warm and dry.     Capillary Refill: Capillary refill takes less than 2 seconds.     Findings: No lesion or rash.  Neurological:     General: No focal deficit present.     Mental Status: He is alert. Mental status is at baseline.  Psychiatric:        Mood and Affect: Mood normal.    ED Results / Procedures / Treatments   Labs (all labs ordered are listed, but only abnormal results are displayed) Labs Reviewed  CBC WITH DIFFERENTIAL/PLATELET - Abnormal; Notable for the following components:      Result Value   RBC 4.07 (*)    Hemoglobin 12.8 (*)    HCT 37.5 (*)    All other components within normal limits  BASIC METABOLIC PANEL - Abnormal; Notable for the following components:   Potassium 3.4 (*)    Glucose, Bld 142 (*)    All other components within normal limits  BRAIN NATRIURETIC PEPTIDE - Abnormal; Notable for the following components:   B  Natriuretic Peptide 109.6 (*)    All other components within normal limits  TROPONIN I (HIGH SENSITIVITY) - Abnormal; Notable for the following components:   Troponin I (High Sensitivity) 20 (*)    All other components within normal limits  TROPONIN I (HIGH SENSITIVITY) - Abnormal; Notable for the following components:   Troponin I (High Sensitivity) 26 (*)    All other components within normal limits  MAGNESIUM    EKG EKG Interpretation  Date/Time:  Friday March 12 2021 23:48:51 EST Ventricular Rate:  97 PR Interval:  QRS Duration: 86 QT Interval:  348 QTC Calculation: 441 R Axis:   29 Text Interpretation: Atrial fibrillation Abnormal ECG When compared with ECG of 19-Jun-2020 14:42, PREVIOUS ECG IS PRESENT Confirmed by Ernie Avena ([id number]) on 03/13/2021 12:54:17 AM  Radiology DG Chest Port 1 View  Result Date: 03/13/2021 CLINICAL DATA:  Atrial fibrillation EXAM: PORTABLE CHEST 1 VIEW COMPARISON:  None. FINDINGS: Heart and mediastinal contours are within normal limits. No focal opacities or effusions. No acute bony abnormality. IMPRESSION: No active disease. Electronically Signed   By: Charlett Nose M.D.   On: 03/13/2021 00:18    Procedures Procedures    Medications Ordered in ED Medications  metoprolol tartrate (LOPRESSOR) injection 5 mg (5 mg Intravenous Given 03/13/21 0047)  potassium chloride SA (KLOR-CON M) CR tablet 20 mEq (20 mEq Oral Given 03/13/21 0139)    ED Course/ Medical Decision Making/ A&P                           Medical Decision Making   66 year old male with a history significant for HLD, intracerebral hemorrhage, CAD, AAA, atrial fibrillation on Eliquis and metoprolol who presents to the emergency department with an uncontrolled heart rate.  The patient states that his symptoms started around 8 PM tonight.  He did take his home dose of Eliquis today.  He states that he felt generally weak and checked his heart rate and it was found to be fast and irregular.   He took an extra home dose of his metoprolol without improvement.  He called EMS and was transported to [facility] emergency department for further care management.  He denies any chest pain or shortness of breath.  On arrival, the patient was afebrile, mildly tachycardic, intermittently in atrial fibrillation and atrial flutter with RVR on cardiac telemetry to the 120s.  Concern for uncontrolled atrial fibrillation despite rate control oral medication attempts at home.  Patient denies any chest pain or shortness of breath to suggest ACS.  He endorses heart palpitations consistent with his atrial fibrillation and atrial flutter.  EKG on arrival reviewed by myself concerning for atrial fibrillation, rate controlled at time of EKG but the patient is intermittently in RVR.  Chest x-ray was performed and reviewed by myself and radiology which revealed no acute cardiac or pulmonary abnormality.  IV access was obtained and the patient was administered IV Lopressor 5 mg.  Following administration of IV Lopressor, the patient converted to normal sinus rhythm and maintained normal sinus rhythm on cardiac telemetry for over 2 hours under observation in the emergency department.  He had no return of his atrial fibrillation.  He is on Eliquis and has been taking the medication.  He is managed outpatient by his PCP and cardiologist.  I advised the patient to take his home dose of metoprolol tomorrow morning and home dose of Eliquis and follow-up routinely with his PCP.  Overall stable for discharge at this time.   Final Clinical Impression(s) / ED Diagnoses Final diagnoses:  Atrial fibrillation with RVR [facility])    Rx / DC Orders ED Discharge Orders     None         Ernie Avena, MD 03/13/21 [phone]

## 2021-05-16 ENCOUNTER — Emergency Department (HOSPITAL_COMMUNITY)
Admission: EM | Admit: 2021-05-16 | Discharge: 2021-05-16 | Disposition: A | Discharge status: Home / Self Care | Payer: No Typology Code available for payment source | Attending: Emergency Medicine | Admitting: Emergency Medicine

## 2021-05-16 DIAGNOSIS — R001 Bradycardia, unspecified: Secondary | ICD-10-CM | POA: Insufficient documentation

## 2021-05-16 DIAGNOSIS — R110 Nausea: Secondary | ICD-10-CM | POA: Insufficient documentation

## 2021-05-16 DIAGNOSIS — R531 Weakness: Secondary | ICD-10-CM | POA: Diagnosis not present

## 2021-05-16 DIAGNOSIS — R42 Dizziness and giddiness: Secondary | ICD-10-CM

## 2021-05-16 DIAGNOSIS — E876 Hypokalemia: Secondary | ICD-10-CM | POA: Diagnosis not present

## 2021-05-16 DIAGNOSIS — Z7901 Long term (current) use of anticoagulants: Secondary | ICD-10-CM | POA: Insufficient documentation

## 2021-05-16 LAB — BASIC METABOLIC PANEL
Anion gap: 7 (ref 5–15)
BUN: 11 mg/dL (ref 8–23)
CO2: 27 mmol/L (ref 22–32)
Calcium: 8.5 mg/dL — ABNORMAL LOW (ref 8.9–10.3)
Chloride: 102 mmol/L (ref 98–111)
Creatinine, Ser: 1.01 mg/dL (ref 0.61–1.24)
GFR, Estimated: >60 mL/min (ref >60)
Glucose, Bld: 143 mg/dL — ABNORMAL HIGH (ref 70–99)
Potassium: 3.3 mmol/L — ABNORMAL LOW (ref 3.5–5.1)
Sodium: 136 mmol/L (ref 135–145)

## 2021-05-16 LAB — CBC WITH DIFFERENTIAL/PLATELET
Abs Immature Granulocytes: 0.01 10*3/uL (ref 0.00–0.07)
Basophils Absolute: 0.1 10*3/uL (ref 0.0–0.1)
Basophils Relative: 1 %
Eosinophils Absolute: 0.3 10*3/uL (ref 0.0–0.5)
Eosinophils Relative: 5 %
HCT: 32.9 % — ABNORMAL LOW (ref 39.0–52.0)
Hemoglobin: 11.8 g/dL — ABNORMAL LOW (ref 13.0–17.0)
Immature Granulocytes: 0 %
Lymphocytes Relative: 37 %
Lymphs Abs: 2.5 10*3/uL (ref 0.7–4.0)
MCH: 33.1 pg (ref 26.0–34.0)
MCHC: 35.9 g/dL (ref 30.0–36.0)
MCV: 92.2 fL (ref 80.0–100.0)
Monocytes Absolute: 0.7 10*3/uL (ref 0.1–1.0)
Monocytes Relative: 9 %
Neutro Abs: 3.3 10*3/uL (ref 1.7–7.7)
Neutrophils Relative %: 48 %
Platelets: 184 10*3/uL (ref 150–400)
RBC: 3.57 MIL/uL — ABNORMAL LOW (ref 4.22–5.81)
RDW: 12.9 % (ref 11.5–15.5)
WBC: 6.9 10*3/uL (ref 4.0–10.5)
nRBC: 0 % (ref 0.0–0.2)

## 2021-05-16 LAB — MAGNESIUM: Magnesium: 1.5 mg/dL — ABNORMAL LOW (ref 1.7–2.4)

## 2021-05-16 MED ORDER — POTASSIUM CHLORIDE CRYS ER 20 MEQ PO TBCR: 40.0000 meq | EXTENDED_RELEASE_TABLET | Freq: Every day | ORAL | 0 refills | Status: DC

## 2021-05-16 MED ORDER — MAGNESIUM OXIDE 400 MG PO TABS: 400.0000 mg | ORAL_TABLET | Freq: Every day | ORAL | 0 refills | Status: DC

## 2021-05-16 MED ORDER — POTASSIUM CHLORIDE CRYS ER 20 MEQ PO TBCR
80.0000 meq | EXTENDED_RELEASE_TABLET | Freq: Once | ORAL | Status: AC
  Administered 2021-05-16: 80 meq via ORAL
  Filled 2021-05-16: qty 4

## 2021-05-16 MED ORDER — MAGNESIUM SULFATE 2 GM/50ML IV SOLN
2.0000 g | Freq: Once | INTRAVENOUS | Status: AC
Start: 2021-05-16 — End: 2021-05-16
  Administered 2021-05-16: 2 g via INTRAVENOUS
  Filled 2021-05-16: qty 50

## 2021-05-16 NOTE — ED Provider Notes (Signed)
?[name] EMERGENCY DEPARTMENT ?Provider Note ? ? ?CSN: [id number] ?Arrival date & time: 05/16/21  0071 ? ?  ? ?History ? ?Chief Complaint  ?Patient presents with  ? Dizziness  ? ? ?Jeffrey [name] is a 66 y.o. male. ? ?66 year old male the presents emerged from today with generalized weakness and dizziness.  Patient has a history of atrial fibrillation and is on 12.5 twice daily metoprolol.  States that he went into RVR today with a heart rate around 150 at home and felt unwell.  His multiple family members are nurses and they suggested that he take an extra 25 mg of his metoprolol and he did.  This helped his heart rate but then he started having orthostatic symptoms.  Some nausea.  Presents here for further evaluation.  Observed slowly improving already. ? ? ?Dizziness ? ?  ? ?Home Medications ?Prior to Admission medications   ?Medication Sig Start Date End Date Taking? Authorizing Provider  ?magnesium oxide (MAG-OX) 400 MG tablet Take 1 tablet (400 mg total) by mouth daily. 05/16/21  Yes Tyra Gural, Barbara Cower, MD  ?potassium chloride SA (KLOR-CON M) 20 MEQ tablet Take 2 tablets (40 mEq total) by mouth daily for 14 days. 05/16/21 05/30/21 Yes Gessica Jawad, Barbara Cower, MD  ?acetaminophen (TYLENOL) 325 MG tablet Take 650 mg by mouth every 6 (six) hours as needed for moderate pain or headache.    [provider]  ?allopurinol (ZYLOPRIM) 100 MG tablet Take 100 mg by mouth daily. 01/22/21   [provider]  ?amoxicillin-clavulanate (AUGMENTIN) 875-125 MG tablet Take 1 tablet by mouth See admin instructions. Bid x 10 days    [provider]  ?apixaban (ELIQUIS) 5 MG TABS tablet Take 5 mg by mouth 2 (two) times daily.    [provider]  ?atorvastatin (LIPITOR) 80 MG tablet Take 80 mg by mouth at bedtime. 02/24/17   [provider]  ?benzonatate (TESSALON) 100 MG capsule Take 100 mg by mouth 2 (two) times daily as needed for cough. 03/04/21   [provider]   ?citalopram (CELEXA) 20 MG tablet Take 10 mg by mouth daily. 06/17/20   [provider]  ?colchicine 0.6 MG tablet Take 0.6 mg by mouth as needed (as directed for gout flares).    [provider]  ?cyclobenzaprine (FLEXERIL) 10 MG tablet Take 5 mg by mouth at bedtime. 06/17/20   [provider]  ?famotidine (PEPCID) 20 MG tablet Take 20 mg by mouth 2 (two) times daily.    [provider]  ?furosemide (LASIX) 20 MG tablet Take 20 mg by mouth in the morning. 11/26/18   [provider]  ?gabapentin (NEURONTIN) 300 MG capsule Take 300 mg by mouth 2 (two) times daily as needed (pain). 09/17/20   [provider]  ?HYDROcodone-acetaminophen (NORCO) 7.5-325 MG tablet Take 1 tablet by mouth 3 (three) times daily as needed for moderate pain. 09/17/20   [provider]  ?lisinopril (ZESTRIL) 5 MG tablet Take 5 mg by mouth daily.    [provider]  ?metoprolol tartrate (LOPRESSOR) 25 MG tablet Take 12.5 mg by mouth 2 (two) times daily. 11/16/14   [provider]  ?Multiple Vitamins-Minerals (PRESERVISION AREDS 2) CAPS Chew 1 capsule by mouth in the morning and at bedtime.    [provider]  ?nitroGLYCERIN (NITROSTAT) 0.4 MG SL tablet Place 0.4 mg under the tongue every 5 (five) minutes as needed for chest pain.    [provider]  ?Omega-3 Fatty Acids (FISH  OIL) 1000 MG CAPS Take 1,000 mg by mouth in the morning.    [provider]  ?omeprazole (PRILOSEC) 20 MG capsule Take 20 mg by mouth daily.    [provider]  ?ranolazine (RANEXA) 500 MG 12 hr tablet Take 500 mg by mouth 2 (two) times daily. 07/26/16   [provider]  ?trimethoprim-polymyxin b (POLYTRIM) ophthalmic solution Place 1 drop into both eyes 4 (four) times daily. ?Patient not taking: Reported on 03/13/2021 03/04/21   [provider]  ?zinc gluconate 50 MG tablet Take 50 mg by mouth daily.    [provider]  ?   ? ?Allergies     ?Shellfish-derived products   ? ?Review of Systems   ?Review of Systems  ?Neurological:  Positive for dizziness.  ? ?Physical Exam ?Updated Vital Signs ?BP 108/68   Pulse (!) 48   Temp 97.9 ?F (36.6 ?C)   Resp 14   SpO2 97%  ?Physical Exam ?Vitals and nursing note reviewed.  ?Constitutional:   ?   Appearance: He is well-developed.  ?HENT:  ?   Head: Normocephalic and atraumatic.  ?   Mouth/Throat:  ?   Mouth: Mucous membranes are moist.  ?   Pharynx: Oropharynx is clear.  ?Eyes:  ?   Pupils: Pupils are equal, round, and reactive to light.  ?Cardiovascular:  ?   Rate and Rhythm: Regular rhythm. Bradycardia present.  ?   Heart sounds: No murmur heard. ?Pulmonary:  ?   Effort: Pulmonary effort is normal. No respiratory distress.  ?Abdominal:  ?   General: Abdomen is flat. There is no distension.  ?Musculoskeletal:     ?   General: Normal range of motion.  ?   Cervical back: Normal range of motion.  ?Skin: ?   General: Skin is warm and dry.  ?Neurological:  ?   General: No focal deficit present.  ?   Mental Status: He is alert and oriented to person, place, and time.  ? ? ?ED Results / Procedures / Treatments   ?Labs ?(all labs ordered are listed, but only abnormal results are displayed) ?Labs Reviewed  ?BASIC METABOLIC PANEL - Abnormal; Notable for the following components:  ?    Result Value  ? Potassium 3.3 (*)   ? Glucose, Bld 143 (*)   ? Calcium 8.5 (*)   ? All other components within normal limits  ?CBC WITH DIFFERENTIAL/PLATELET - Abnormal; Notable for the following components:  ? RBC 3.57 (*)   ? Hemoglobin 11.8 (*)   ? HCT 32.9 (*)   ? All other components within normal limits  ?MAGNESIUM - Abnormal; Notable for the following components:  ? Magnesium 1.5 (*)   ? All other components within normal limits  ? ? ?EKG ?None ? ?Radiology ?No results found. ? ?Procedures ?Procedures  ? ? ?Medications Ordered in ED ?Medications  ?potassium chloride SA (KLOR-CON M) CR tablet 80 mEq (80 mEq Oral Given 05/16/21 0610)   ?magnesium sulfate IVPB 2 g 50 mL (0 g Intravenous Stopped 05/16/21 0718)  ? ? ?ED Course/ Medical Decision Making/ A&P ?  ?                        ?Medical Decision Making ?Amount and/or Complexity of Data Reviewed ?Labs: ordered. ? ?Risk ?OTC drugs. ?Prescription drug management. ? ? ?Suspect symptoms are probably from this mild bradycardia induced by the extra metoprolol.  He was observed here for few hours.  Was  able to ambulate.  Able to tolerate p.o.  His potassium magnesium were both little bit low and those were supplemented here.  He will continue to supplement those and have him followed up as an outpatient.  I advised him to not take his morning metoprolol at the normal time to take a little bit later and only if he is feeling normal.  He verbalized understanding.  Felt safe with discharge plan.  I also advised that ?Instead of taking double his dose may be just taking an extra dose. ? ? ?Final Clinical Impression(s) / ED Diagnoses ?Final diagnoses:  ?None  ? ? ?Rx / DC Orders ?ED Discharge Orders   ? ?      Ordered  ?  potassium chloride SA (KLOR-CON M) 20 MEQ tablet  Daily       ? 05/16/21 0556  ?  magnesium oxide (MAG-OX) 400 MG tablet  Daily       ? 05/16/21 0556  ? ?  ?  ? ?  ? ? ?  ?Marily MemosMesner, Attila Mccarthy, MD ?05/16/21 2326 ? ?

## 2021-05-16 NOTE — ED Triage Notes (Signed)
EMS arrival. Came from home. Takes 12.5 Metoprolol at home for A-fib bid. Doubled up on dose at home. Became dizzy. Sxs have resided upon arrival.  ?

## 2021-05-16 NOTE — ED Notes (Signed)
Pt verbalizes understanding of discharge instructions. Opportunity for questions and answers were provided. Pt discharged from the ED.   ?

## 2021-08-20 ENCOUNTER — Emergency Department (HOSPITAL_COMMUNITY)
Admission: EM | Admit: 2021-08-20 | Discharge: 2021-08-20 | Disposition: A | Discharge status: Home / Self Care | Payer: No Typology Code available for payment source | Attending: Emergency Medicine | Admitting: Emergency Medicine

## 2021-08-20 ENCOUNTER — Encounter (HOSPITAL_COMMUNITY): Payer: Self-pay | Admitting: Emergency Medicine

## 2021-08-20 ENCOUNTER — Other Ambulatory Visit: Payer: Self-pay

## 2021-08-20 DIAGNOSIS — I480 Paroxysmal atrial fibrillation: Secondary | ICD-10-CM | POA: Diagnosis not present

## 2021-08-20 DIAGNOSIS — Z79899 Other long term (current) drug therapy: Secondary | ICD-10-CM | POA: Insufficient documentation

## 2021-08-20 DIAGNOSIS — I2510 Atherosclerotic heart disease of native coronary artery without angina pectoris: Secondary | ICD-10-CM | POA: Insufficient documentation

## 2021-08-20 DIAGNOSIS — Z7901 Long term (current) use of anticoagulants: Secondary | ICD-10-CM | POA: Diagnosis not present

## 2021-08-20 DIAGNOSIS — R002 Palpitations: Secondary | ICD-10-CM | POA: Diagnosis present

## 2021-08-20 LAB — CBC
HCT: 37.1 % — ABNORMAL LOW (ref 39.0–52.0)
Hemoglobin: 12.9 g/dL — ABNORMAL LOW (ref 13.0–17.0)
MCH: 32.7 pg (ref 26.0–34.0)
MCHC: 34.8 g/dL (ref 30.0–36.0)
MCV: 93.9 fL (ref 80.0–100.0)
Platelets: 230 10*3/uL (ref 150–400)
RBC: 3.95 MIL/uL — ABNORMAL LOW (ref 4.22–5.81)
RDW: 13.7 % (ref 11.5–15.5)
WBC: 8.3 10*3/uL (ref 4.0–10.5)
nRBC: 0 % (ref 0.0–0.2)

## 2021-08-20 LAB — BASIC METABOLIC PANEL
Anion gap: 12 (ref 5–15)
BUN: 10 mg/dL (ref 8–23)
CO2: 22 mmol/L (ref 22–32)
Calcium: 9.4 mg/dL (ref 8.9–10.3)
Chloride: 105 mmol/L (ref 98–111)
Creatinine, Ser: 0.97 mg/dL (ref 0.61–1.24)
GFR, Estimated: >60 mL/min (ref >60)
Glucose, Bld: 148 mg/dL — ABNORMAL HIGH (ref 70–99)
Potassium: 3.6 mmol/L (ref 3.5–5.1)
Sodium: 139 mmol/L (ref 135–145)

## 2021-08-20 LAB — MAGNESIUM: Magnesium: 1.6 mg/dL — ABNORMAL LOW (ref 1.7–2.4)

## 2021-08-20 LAB — TROPONIN I (HIGH SENSITIVITY)
Troponin I (High Sensitivity): 18 ng/L — ABNORMAL HIGH
Troponin I (High Sensitivity): 26 ng/L — ABNORMAL HIGH

## 2021-08-20 MED ORDER — MAGNESIUM SULFATE 2 GM/50ML IV SOLN
2.0000 g | Freq: Once | INTRAVENOUS | Status: AC
  Administered 2021-08-20: 2 g via INTRAVENOUS
  Filled 2021-08-20: qty 50

## 2021-08-20 MED ORDER — METOPROLOL TARTRATE 5 MG/5ML IV SOLN
2.5000 mg | Freq: Once | INTRAVENOUS | Status: AC
Start: 2021-08-20 — End: 2021-08-20
  Administered 2021-08-20: 2.5 mg via INTRAVENOUS
  Filled 2021-08-20: qty 5

## 2021-08-20 NOTE — ED Triage Notes (Addendum)
Patient arrived with EMS from home reports left chest pain with palpitations this evening , mild SOB , denies diaphoresis or nausea , history of CAD with coronary stents / Afib , his cardiologist is at [place] . He received ASA 324 mg prior to arrival by EMS.

## 2021-08-20 NOTE — ED Provider Notes (Signed)
[facility] EMERGENCY DEPARTMENT Provider Note   CSN: [id number] Arrival date & time: 08/20/21  0101     History  Chief Complaint  Patient presents with   Chest Pain / Palpitations    Hx. CAD/Stents/AFib    Jeffrey [name] is a 66 y.o. male.  The history is provided by the patient and the spouse.  Palpitations Palpitations quality:  Fast Onset quality:  Sudden Duration:  2 hours Timing:  Constant Progression:  Unchanged Chronicity:  Recurrent Worsened by:  Nothing Associated symptoms: chest pressure   Associated symptoms: no shortness of breath, no syncope and no vomiting   Risk factors: heart disease and hx of atrial fibrillation   Patient with history of CAD, paroxysmal atrial fibrillation presents with palpitations and chest pressure.  Patient reports he was in his usual state of health approximately 2 hours ago had sudden onset of palpitations.  He reports chest pressure but no shortness of breath.  No syncope.  Patient reports this feels similar to previous episodes of A-fib.  He reports he has been on Eliquis twice daily for up to a year without any missed doses he has required electrical cardioversion previously but is also converted with metoprolol     Home Medications Prior to Admission medications   Medication Sig Start Date End Date Taking? Authorizing Provider  acetaminophen (TYLENOL) 325 MG tablet Take 650 mg by mouth every 6 (six) hours as needed for moderate pain or headache.    [provider]  allopurinol (ZYLOPRIM) 100 MG tablet Take 100 mg by mouth daily. 01/22/21   [provider]  amoxicillin-clavulanate (AUGMENTIN) 875-125 MG tablet Take 1 tablet by mouth See admin instructions. Bid x 10 days    [provider]  apixaban (ELIQUIS) 5 MG TABS tablet Take 5 mg by mouth 2 (two) times daily.    [provider]  atorvastatin (LIPITOR) 80 MG tablet Take 80 mg by mouth at bedtime. 02/24/17   [provider]  benzonatate (TESSALON) 100 MG capsule Take 100 mg by mouth 2 (two) times daily as needed for cough. 03/04/21   [provider]  citalopram (CELEXA) 20 MG tablet Take 10 mg by mouth daily. 06/17/20   [provider]  colchicine 0.6 MG tablet Take 0.6 mg by mouth as needed (as directed for gout flares).    [provider]  cyclobenzaprine (FLEXERIL) 10 MG tablet Take 5 mg by mouth at bedtime. 06/17/20   [provider]  famotidine (PEPCID) 20 MG tablet Take 20 mg by mouth 2 (two) times daily.    [provider]  furosemide (LASIX) 20 MG tablet Take 20 mg by mouth in the morning. 11/26/18   [provider]  gabapentin (NEURONTIN) 300 MG capsule Take 300 mg by mouth 2 (two) times daily as needed (pain). 09/17/20   [provider]  HYDROcodone-acetaminophen (NORCO) 7.5-325 MG tablet Take 1 tablet by mouth 3 (three) times daily as needed for moderate pain. 09/17/20   [provider]  lisinopril (ZESTRIL) 5 MG tablet Take 5 mg by mouth daily.    [provider]  magnesium oxide (MAG-OX) 400 MG tablet Take 1 tablet (400 mg total) by mouth daily. 05/16/21   Mesner, Barbara Cower, MD  metoprolol tartrate (LOPRESSOR) 25 MG tablet Take 12.5 mg by mouth 2 (two) times daily. 11/16/14   [provider]  Multiple Vitamins-Minerals (PRESERVISION AREDS 2) CAPS Chew 1 capsule by mouth in the morning and at bedtime.  [provider]  nitroGLYCERIN (NITROSTAT) 0.4 MG SL tablet Place 0.4 mg under the tongue every 5 (five) minutes as needed for chest pain.    [provider]  Omega-3 Fatty Acids (FISH OIL) 1000 MG CAPS Take 1,000 mg by mouth in the morning.    [provider]  omeprazole (PRILOSEC) 20 MG capsule Take 20 mg by mouth daily.    [provider]  potassium chloride SA (KLOR-CON M) 20 MEQ tablet Take 2 tablets (40 mEq total) by mouth daily for 14 days. 05/16/21 05/30/21  Mesner, Barbara Cower,  MD  ranolazine (RANEXA) 500 MG 12 hr tablet Take 500 mg by mouth 2 (two) times daily. 07/26/16   [provider]  trimethoprim-polymyxin b (POLYTRIM) ophthalmic solution Place 1 drop into both eyes 4 (four) times daily. Patient not taking: Reported on 03/13/2021 03/04/21   [provider]  zinc gluconate 50 MG tablet Take 50 mg by mouth daily.    [provider]      Allergies    Shellfish-derived products    Review of Systems   Review of Systems  Respiratory:  Negative for shortness of breath.   Cardiovascular:  Positive for palpitations. Negative for syncope.  Gastrointestinal:  Negative for vomiting.  Neurological:  Negative for syncope.    Physical Exam Updated Vital Signs BP 118/85   Pulse 72   Temp (!) 97.5 F (36.4 C) (Oral)   Resp 13   SpO2 98%  Physical Exam CONSTITUTIONAL: Well developed/well nourished HEAD: Normocephalic/atraumatic EYES: EOMI ENMT: Mucous membranes moist NECK: supple no meningeal signs SPINE/BACK:entire spine nontender CV: Irregular, no loud murmur LUNGS: Lungs are clear to auscultation bilaterally, no apparent distress ABDOMEN: soft, nontender NEURO: Pt is awake/alert/appropriate, moves all extremitiesx4.  No facial droop.   EXTREMITIES: pulses normal/equal, full ROM, no lower extremity edema SKIN: warm, color normal PSYCH: no abnormalities of mood noted, alert and oriented to situation  ED Results / Procedures / Treatments   Labs (all labs ordered are listed, but only abnormal results are displayed) Labs Reviewed  BASIC METABOLIC PANEL - Abnormal; Notable for the following components:      Result Value   Glucose, Bld 148 (*)    All other components within normal limits  MAGNESIUM - Abnormal; Notable for the following components:   Magnesium 1.6 (*)    All other components within normal limits  CBC - Abnormal; Notable for the following components:   RBC 3.95 (*)    Hemoglobin 12.9 (*)    HCT 37.1 (*)    All  other components within normal limits  TROPONIN I (HIGH SENSITIVITY) - Abnormal; Notable for the following components:   Troponin I (High Sensitivity) 18 (*)    All other components within normal limits    EKG EKG Interpretation  Date/Time:  Friday August 20 2021 01:08:20 EDT Ventricular Rate:  126 PR Interval:    QRS Duration: 89 QT Interval:  340 QTC Calculation: 493 R Axis:   42 Text Interpretation: Atrial fibrillation Borderline ST depression, diffuse leads Borderline prolonged QT interval Confirmed by Zadie Rhine ([id number]) on 08/20/2021 1:10:40 AM    EKG Interpretation  Date/Time:  Friday August 20 2021 02:35:12 EDT Ventricular Rate:  61 PR Interval:  185 QRS Duration: 99 QT Interval:  402 QTC Calculation: 405 R Axis:   28 Text Interpretation: Sinus rhythm Low voltage, precordial leads Confirmed by Zadie Rhine ([id number]) on 08/20/2021 2:37:38 AM        Radiology No results found.  Procedures Procedures    Medications Ordered in ED Medications  metoprolol tartrate (LOPRESSOR) injection 2.5 mg (2.5 mg Intravenous Given 08/20/21 0215)  magnesium sulfate IVPB 2 g 50 mL (0 g Intravenous Stopped 08/20/21 0337)    ED Course/ Medical Decision Making/ A&P Clinical Course as of 08/20/21 0514  Fri Aug 20, 2021  0221 Glucose(!): 148 Mild hyperglycemia [DW]  0221 Magnesium(!): 1.6 Hypomagnesemia [DW]  0236 Patient is now back in sinus rhythm after receiving metoprolol.  He is in no distress.  Troponin minimally elevated but likely due to recent A-fib.  Low suspicion for ACS [DW]  [phone] Patient has been sinus rhythm for several hours.  He feels back to baseline.  Troponin is minimally elevated likely due to elevated rate earlier in the night.  He is safe for discharge home [DW]    Clinical Course User Index [DW] Zadie Rhine, MD                           Medical Decision Making Amount and/or Complexity of Data Reviewed Labs: ordered. Decision-making details  documented in ED Course.  Risk Prescription drug management.   This patient presents to the ED for concern of palpitation, this involves an extensive number of treatment options, and is a complaint that carries with it a high risk of complications and morbidity.  The differential diagnosis includes but is not limited to atrial fibrillation with rapid ventricular rate, SVT, sinus tachycardia, ventricular tachycardia  Comorbidities that complicate the patient evaluation: Patient's presentation is complicated by their history of CAD and paroxysmal atrial fibrillation   Additional history obtained: Additional history obtained from spouse Records reviewed Care Everywhere/External Records [facility] records reviewed  Lab Tests: I Ordered, and personally interpreted labs.  The pertinent results include: Hypomagnesemia  Cardiac Monitoring: The patient was maintained on a cardiac monitor.  I personally viewed and interpreted the cardiac monitor which showed an underlying rhythm of:  Atrial Fibrillation  Medicines ordered and prescription drug management: I ordered medication including metoprolol for tachycardia Reevaluation of the patient after these medicines showed that the patient    improved   Critical Interventions:  IV Lopressor, IV magnesium   Reevaluation: After the interventions noted above, I reevaluated the patient and found that they have :improved  Complexity of problems addressed: Patient's presentation is most consistent with  acute presentation with potential threat to life or bodily function  Disposition: After consideration of the diagnostic results and the patient's response to treatment,  I feel that the patent would benefit from discharge   .           Final Clinical Impression(s) / ED Diagnoses Final diagnoses:  Paroxysmal atrial fibrillation (HCC)  Hypomagnesemia    Rx / DC Orders ED Discharge Orders     None          Zadie Rhine, MD 08/20/21 [phone]

## 2021-11-10 ENCOUNTER — Other Ambulatory Visit (HOSPITAL_COMMUNITY): Payer: Self-pay | Admitting: Orthopedic Surgery

## 2021-11-10 ENCOUNTER — Other Ambulatory Visit: Payer: Self-pay | Admitting: Orthopedic Surgery

## 2021-11-10 DIAGNOSIS — M25562 Pain in left knee: Secondary | ICD-10-CM

## 2021-11-10 DIAGNOSIS — S72452A Displaced supracondylar fracture without intracondylar extension of lower end of left femur, initial encounter for closed fracture: Secondary | ICD-10-CM

## 2021-11-26 ENCOUNTER — Encounter (HOSPITAL_COMMUNITY): Payer: Self-pay

## 2021-11-26 ENCOUNTER — Other Ambulatory Visit: Payer: Self-pay

## 2021-11-26 ENCOUNTER — Observation Stay (HOSPITAL_COMMUNITY)
Admission: EM | Admit: 2021-11-26 | Discharge: 2021-11-27 | Disposition: A | Discharge status: Home / Self Care | Payer: No Typology Code available for payment source | Attending: Family Medicine | Admitting: Family Medicine

## 2021-11-26 ENCOUNTER — Observation Stay (HOSPITAL_COMMUNITY): Payer: No Typology Code available for payment source

## 2021-11-26 DIAGNOSIS — I4892 Unspecified atrial flutter: Secondary | ICD-10-CM | POA: Insufficient documentation

## 2021-11-26 DIAGNOSIS — I2510 Atherosclerotic heart disease of native coronary artery without angina pectoris: Secondary | ICD-10-CM | POA: Diagnosis not present

## 2021-11-26 DIAGNOSIS — K219 Gastro-esophageal reflux disease without esophagitis: Secondary | ICD-10-CM | POA: Insufficient documentation

## 2021-11-26 DIAGNOSIS — Z87891 Personal history of nicotine dependence: Secondary | ICD-10-CM | POA: Insufficient documentation

## 2021-11-26 DIAGNOSIS — Z955 Presence of coronary angioplasty implant and graft: Secondary | ICD-10-CM | POA: Insufficient documentation

## 2021-11-26 DIAGNOSIS — I480 Paroxysmal atrial fibrillation: Secondary | ICD-10-CM | POA: Diagnosis not present

## 2021-11-26 DIAGNOSIS — Z79899 Other long term (current) drug therapy: Secondary | ICD-10-CM | POA: Insufficient documentation

## 2021-11-26 DIAGNOSIS — R002 Palpitations: Secondary | ICD-10-CM | POA: Diagnosis present

## 2021-11-26 DIAGNOSIS — Z7901 Long term (current) use of anticoagulants: Secondary | ICD-10-CM | POA: Diagnosis not present

## 2021-11-26 DIAGNOSIS — I10 Essential (primary) hypertension: Secondary | ICD-10-CM | POA: Diagnosis not present

## 2021-11-26 DIAGNOSIS — M109 Gout, unspecified: Secondary | ICD-10-CM | POA: Insufficient documentation

## 2021-11-26 DIAGNOSIS — I4891 Unspecified atrial fibrillation: Secondary | ICD-10-CM | POA: Diagnosis not present

## 2021-11-26 LAB — BASIC METABOLIC PANEL
Anion gap: 7 (ref 5–15)
BUN: 10 mg/dL (ref 8–23)
CO2: 25 mmol/L (ref 22–32)
Calcium: 8.8 mg/dL — ABNORMAL LOW (ref 8.9–10.3)
Chloride: 107 mmol/L (ref 98–111)
Creatinine, Ser: 0.92 mg/dL (ref 0.61–1.24)
GFR, Estimated: >60 mL/min (ref >60)
Glucose, Bld: 118 mg/dL — ABNORMAL HIGH (ref 70–99)
Potassium: 3.8 mmol/L (ref 3.5–5.1)
Sodium: 139 mmol/L (ref 135–145)

## 2021-11-26 LAB — CBC
HCT: 36.8 % — ABNORMAL LOW (ref 39.0–52.0)
Hemoglobin: 12.4 g/dL — ABNORMAL LOW (ref 13.0–17.0)
MCH: 31.8 pg (ref 26.0–34.0)
MCHC: 33.7 g/dL (ref 30.0–36.0)
MCV: 94.4 fL (ref 80.0–100.0)
Platelets: 213 10*3/uL (ref 150–400)
RBC: 3.9 MIL/uL — ABNORMAL LOW (ref 4.22–5.81)
RDW: 13.8 % (ref 11.5–15.5)
WBC: 7.4 10*3/uL (ref 4.0–10.5)
nRBC: 0 % (ref 0.0–0.2)

## 2021-11-26 LAB — TSH: TSH: 1.115 u[IU]/mL (ref 0.350–4.500)

## 2021-11-26 LAB — MAGNESIUM: Magnesium: 1.8 mg/dL (ref 1.7–2.4)

## 2021-11-26 MED ORDER — PROCHLORPERAZINE EDISYLATE 10 MG/2ML IJ SOLN: 5.0000 mg | Freq: Four times a day (QID) | INTRAMUSCULAR | Status: DC | PRN

## 2021-11-26 MED ORDER — PROSIGHT PO TABS
1.0000 | ORAL_TABLET | Freq: Every day | ORAL | Status: DC
  Administered 2021-11-27: 1 via ORAL
  Filled 2021-11-26: qty 1

## 2021-11-26 MED ORDER — OMEGA-3-ACID ETHYL ESTERS 1 G PO CAPS
1.0000 g | ORAL_CAPSULE | Freq: Every day | ORAL | Status: DC
  Administered 2021-11-27: 1 g via ORAL
  Filled 2021-11-26: qty 1

## 2021-11-26 MED ORDER — PANTOPRAZOLE SODIUM 40 MG PO TBEC
40.0000 mg | DELAYED_RELEASE_TABLET | Freq: Every day | ORAL | Status: DC
  Administered 2021-11-26 – 2021-11-27 (×2): 40 mg via ORAL
  Filled 2021-11-26 (×2): qty 1

## 2021-11-26 MED ORDER — ATORVASTATIN CALCIUM 80 MG PO TABS
80.0000 mg | ORAL_TABLET | Freq: Every day | ORAL | Status: DC
  Administered 2021-11-26: 80 mg via ORAL
  Filled 2021-11-26: qty 1

## 2021-11-26 MED ORDER — RANOLAZINE ER 500 MG PO TB12
500.0000 mg | ORAL_TABLET | Freq: Two times a day (BID) | ORAL | Status: DC
  Administered 2021-11-26 – 2021-11-27 (×2): 500 mg via ORAL
  Filled 2021-11-26 (×2): qty 1

## 2021-11-26 MED ORDER — METOPROLOL TARTRATE 5 MG/5ML IV SOLN
2.5000 mg | Freq: Once | INTRAVENOUS | Status: AC
  Administered 2021-11-26: 2.5 mg via INTRAVENOUS
  Filled 2021-11-26: qty 5

## 2021-11-26 MED ORDER — APIXABAN 5 MG PO TABS
5.0000 mg | ORAL_TABLET | Freq: Two times a day (BID) | ORAL | Status: DC
  Administered 2021-11-26 – 2021-11-27 (×2): 5 mg via ORAL
  Filled 2021-11-26 (×2): qty 1

## 2021-11-26 MED ORDER — MELATONIN 5 MG PO TABS
5.0000 mg | ORAL_TABLET | Freq: Every evening | ORAL | Status: DC | PRN
  Administered 2021-11-26: 5 mg via ORAL
  Filled 2021-11-26: qty 1

## 2021-11-26 MED ORDER — POLYETHYLENE GLYCOL 3350 17 G PO PACK: 17.0000 g | PACK | Freq: Every day | ORAL | Status: DC | PRN

## 2021-11-26 MED ORDER — METOPROLOL TARTRATE 12.5 MG HALF TABLET
12.5000 mg | ORAL_TABLET | Freq: Two times a day (BID) | ORAL | Status: DC
  Administered 2021-11-26 – 2021-11-27 (×2): 12.5 mg via ORAL
  Filled 2021-11-26 (×2): qty 1

## 2021-11-26 MED ORDER — CITALOPRAM HYDROBROMIDE 20 MG PO TABS
10.0000 mg | ORAL_TABLET | Freq: Every day | ORAL | Status: DC
  Administered 2021-11-26 – 2021-11-27 (×2): 10 mg via ORAL
  Filled 2021-11-26 (×2): qty 1

## 2021-11-26 MED ORDER — METOPROLOL TARTRATE 5 MG/5ML IV SOLN: 5.0000 mg | Freq: Four times a day (QID) | INTRAVENOUS | Status: DC | PRN

## 2021-11-26 MED ORDER — ALLOPURINOL 100 MG PO TABS
100.0000 mg | ORAL_TABLET | Freq: Every day | ORAL | Status: DC
  Administered 2021-11-26 – 2021-11-27 (×2): 100 mg via ORAL
  Filled 2021-11-26 (×2): qty 1

## 2021-11-26 MED ORDER — GABAPENTIN 300 MG PO CAPS: 300.0000 mg | ORAL_CAPSULE | Freq: Two times a day (BID) | ORAL | Status: DC | PRN

## 2021-11-26 MED ORDER — FUROSEMIDE 20 MG PO TABS
20.0000 mg | ORAL_TABLET | Freq: Every morning | ORAL | Status: DC
  Administered 2021-11-27: 20 mg via ORAL
  Filled 2021-11-26: qty 1

## 2021-11-26 MED ORDER — ACETAMINOPHEN 325 MG PO TABS: 650.0000 mg | ORAL_TABLET | Freq: Four times a day (QID) | ORAL | Status: DC | PRN

## 2021-11-26 MED ORDER — FAMOTIDINE 20 MG PO TABS
20.0000 mg | ORAL_TABLET | Freq: Two times a day (BID) | ORAL | Status: DC
  Administered 2021-11-26 – 2021-11-27 (×2): 20 mg via ORAL
  Filled 2021-11-26 (×2): qty 1

## 2021-11-26 NOTE — ED Provider Triage Note (Signed)
Emergency Medicine Provider Triage Evaluation Note  Jeffrey [name] , a 66 y.o. male  was evaluated in triage.  Pt complains of concerns for A-fib onset 2 AM.  Notes that he felt increasing palpitations and took a metoprolol at home with mild improvement of his symptoms.  Has associated fatigue.  Denies chest pain, shortness of breath, nausea, vomiting, dizziness, lightheadedness.  Review of Systems  Positive:  Negative:   Physical Exam  BP 125/65 (BP Location: Right Arm)   Pulse (!) 102   Temp 97.8 F (36.6 C) (Oral)   Resp 16   SpO2 97%  Gen:   Awake, no distress   Resp:  Normal effort  MSK:   Moves extremities without difficulty  Other:  Irregularly irregular rhythm  Medical Decision Making  Medically screening exam initiated at 2:12 PM.  Appropriate orders placed.  Jeffrey [name] was informed that the remainder of the evaluation will be completed by another provider, this initial triage assessment does not replace that evaluation, and the importance of remaining in the ED until their evaluation is complete.  Work-up initiated   Edwin Cherian A, PA-C 11/26/21 1423

## 2021-11-26 NOTE — ED Triage Notes (Signed)
Pt BIB [facility] for [employer]. Pt woke up this am and felt the flutter so he took some metoprolol that is what is dr told him to do. It did not help so he called EMS. EMS gave 500 NS and stated his rate is between 70-115.

## 2021-11-26 NOTE — ED Provider Notes (Signed)
[facility] EMERGENCY DEPARTMENT Provider Note   CSN: [id number] Arrival date & time: 11/26/21  1329     History  Chief Complaint  Patient presents with   Atrial Fibrillation    Jeffrey [name] is a 66 y.o. male.  Patient is a 66 year old male with a past medical history of A-fib on Eliquis, CAD, hypertension presenting to the emergency department with palpitations.  Patient states he woke up this morning feeling palpitations and like he was in A-fib.  He states that he measured his heart rate and noted that he was in the 150s.  He states that he talked to his cardiologist who recommended he take an extra dose of metoprolol which she did but states that the palpitations did not resolve so he came to the emergency department.  He states that this is happened to him a few times in the past and has resolved with an IV medication.  He denies any associated chest pain, shortness of breath, lightheadedness or dizziness.  He denies any recent fevers, nausea, vomiting or diarrhea.  He states he has been taking his medications as prescribed.  The history is provided by the patient and the spouse.  Atrial Fibrillation       Home Medications Prior to Admission medications   Medication Sig Start Date End Date Taking? Authorizing Provider  acetaminophen (TYLENOL) 325 MG tablet Take 650 mg by mouth every 6 (six) hours as needed for moderate pain or headache.   Yes [provider]  allopurinol (ZYLOPRIM) 100 MG tablet Take 100 mg by mouth daily. 01/22/21  Yes [provider]  apixaban (ELIQUIS) 5 MG TABS tablet Take 5 mg by mouth 2 (two) times daily.   Yes [provider]  atorvastatin (LIPITOR) 80 MG tablet Take 80 mg by mouth at bedtime. 02/24/17  Yes [provider]  citalopram (CELEXA) 20 MG tablet Take 10 mg by mouth daily. 06/17/20  Yes [provider]  colchicine 0.6 MG tablet Take 0.6 mg by mouth as needed (as directed for gout  flares).   Yes [provider]  famotidine (PEPCID) 20 MG tablet Take 20 mg by mouth 2 (two) times daily.   Yes [provider]  furosemide (LASIX) 20 MG tablet Take 20 mg by mouth in the morning. 11/26/18  Yes [provider]  gabapentin (NEURONTIN) 300 MG capsule Take 300 mg by mouth 2 (two) times daily as needed (pain). 09/17/20  Yes [provider]  lisinopril (ZESTRIL) 5 MG tablet Take 5 mg by mouth daily.   Yes [provider]  metoprolol tartrate (LOPRESSOR) 25 MG tablet Take 12.5 mg by mouth 2 (two) times daily. 11/16/14  Yes [provider]  Multiple Vitamins-Minerals (PRESERVISION AREDS 2) CAPS Chew 1 capsule by mouth in the morning and at bedtime.   Yes [provider]  nitroGLYCERIN (NITROSTAT) 0.4 MG SL tablet Place 0.4 mg under the tongue every 5 (five) minutes as needed for chest pain.   Yes [provider]  Omega-3 Fatty Acids (FISH OIL) 1000 MG CAPS Take 1,000 mg by mouth in the morning.   Yes [provider]  omeprazole (PRILOSEC) 20 MG capsule Take 20 mg by mouth daily.   Yes [provider]  ranolazine (RANEXA) 500 MG 12 hr tablet Take 500 mg by mouth 2 (two) times daily. 07/26/16  Yes [provider]  magnesium oxide (MAG-OX) 400 MG tablet Take 1 tablet (400 mg total) by mouth daily. Patient not  taking: Reported on 11/26/2021 05/16/21   Mesner, Barbara Cower, MD  potassium chloride SA (KLOR-CON M) 20 MEQ tablet Take 2 tablets (40 mEq total) by mouth daily for 14 days. Patient not taking: Reported on 11/26/2021 05/16/21 05/30/21  Mesner, Barbara Cower, MD      Allergies    Shellfish-derived products    Review of Systems   Review of Systems  Physical Exam Updated Vital Signs BP 118/68   Pulse (!) 48   Temp 98 F (36.7 C) (Oral)   Resp 13   SpO2 98%  Physical Exam Vitals and nursing note reviewed.  Constitutional:      General: He is not in acute distress.    Appearance: Normal appearance.   HENT:     Head: Normocephalic and atraumatic.     Nose: Nose normal.     Mouth/Throat:     Mouth: Mucous membranes are moist.     Pharynx: Oropharynx is clear.  Eyes:     Extraocular Movements: Extraocular movements intact.     Conjunctiva/sclera: Conjunctivae normal.  Cardiovascular:     Rate and Rhythm: Tachycardia present. Rhythm irregular.     Pulses: Normal pulses.     Heart sounds: Normal heart sounds.  Pulmonary:     Effort: Pulmonary effort is normal.     Breath sounds: Normal breath sounds.  Abdominal:     General: Abdomen is flat.     Palpations: Abdomen is soft.     Tenderness: There is no abdominal tenderness.  Musculoskeletal:        General: Normal range of motion.     Cervical back: Normal range of motion and neck supple.     Right lower leg: No edema.     Left lower leg: No edema.  Skin:    General: Skin is warm and dry.  Neurological:     General: No focal deficit present.     Mental Status: He is alert and oriented to person, place, and time.  Psychiatric:        Mood and Affect: Mood normal.        Behavior: Behavior normal.     ED Results / Procedures / Treatments   Labs (all labs ordered are listed, but only abnormal results are displayed) Labs Reviewed  BASIC METABOLIC PANEL - Abnormal; Notable for the following components:      Result Value   Glucose, Bld 118 (*)    Calcium 8.8 (*)    All other components within normal limits  CBC - Abnormal; Notable for the following components:   RBC 3.90 (*)    Hemoglobin 12.4 (*)    HCT 36.8 (*)    All other components within normal limits  MAGNESIUM  TSH  HIV ANTIBODY (ROUTINE TESTING W REFLEX)    EKG EKG Interpretation  Date/Time:  Friday November 26 2021 14:14:54 EDT Ventricular Rate:  115 PR Interval:  196 QRS Duration: 82 QT Interval:  356 QTC Calculation: 492 R Axis:   10 Text Interpretation: Atrial flutter with variable block Cannot rule out Inferior infarct , age undetermined  Abnormal ECG  Now in atrial flutter compared to prior EKG Confirmed by Arturo Morton ([id number]) on 11/26/2021 4:58:10 PM  Radiology No results found.  Procedures Procedures    Medications Ordered in ED Medications  allopurinol (ZYLOPRIM) tablet 100 mg (has no administration in time range)  apixaban (ELIQUIS) tablet 5 mg (has no administration in time range)  atorvastatin (LIPITOR) tablet 80 mg (has no administration in time  range)  citalopram (CELEXA) tablet 10 mg (has no administration in time range)  famotidine (PEPCID) tablet 20 mg (has no administration in time range)  metoprolol tartrate (LOPRESSOR) tablet 12.5 mg (has no administration in time range)  pantoprazole (PROTONIX) EC tablet 40 mg (has no administration in time range)  metoprolol tartrate (LOPRESSOR) injection 5 mg (has no administration in time range)  acetaminophen (TYLENOL) tablet 650 mg (has no administration in time range)  prochlorperazine (COMPAZINE) injection 5 mg (has no administration in time range)  polyethylene glycol (MIRALAX / GLYCOLAX) packet 17 g (has no administration in time range)  melatonin tablet 5 mg (has no administration in time range)  metoprolol tartrate (LOPRESSOR) injection 2.5 mg (2.5 mg Intravenous Given 11/26/21 1733)  metoprolol tartrate (LOPRESSOR) injection 2.5 mg (2.5 mg Intravenous Given 11/26/21 1943)    ED Course/ Medical Decision Making/ A&P Clinical Course as of 11/26/21 2203  Fri Nov 26, 2021  1920 HR remained rate controlled in the 90s-100s after metoprolol but still in atrial flutter on the monitor. The patient remains asymptomatic besides feeling palpitations. He has a cardiologist outpatient and is on anticoagulation. He was recommended cardiology follow up [VK]  2058 I spoke with Dr Rosemary Holms who cardiology who recommended offering electrical cardioversion versus observation.  The patient states that he has had electrical cardioversion in the past and is not interested in  cardioversion at this time.  He states he would prefer to be observed in the hospital overnight. [VK]    Clinical Course User Index [VK] Phoebe Sharps, DO                           Medical Decision Making This patient presents to the ED with chief complaint(s) of palpitations with pertinent past medical history of HTN, paroxysmal afib, CAD which further complicates the presenting complaint. The complaint involves an extensive differential diagnosis and also carries with it a high risk of complications and morbidity.    The differential diagnosis includes arrhythmia, electrolyte abnormality, thyroid dysfunction, patient denies any chest pain or shortness of breath making ACS unlikely  Additional history obtained: Additional history obtained from family Records reviewed previous ED records  ED Course and Reassessment: Patient was initially evaluated by provider in triage and had labs and EKG performed.  He was found to be in atrial flutter with a rate between the 90s to 100s.  Labs are within normal range.  Patient has had several ED visits with atrial flutter with RVR A-fib with RVR and has been cardioverted with IV metoprolol.  Patient will be given 2.5 mg of IV metoprolol and will be reassessed for any improvement of his rate or rhythm.  Independent labs interpretation:  The following labs were independently interpreted: Within normal range  Independent visualization of imaging: - I independently visualized the following imaging with scope of interpretation limited to determining acute life threatening conditions related to emergency care: CXR, which revealed no acute disease  Consultation: - Consulted or discussed management/test interpretation w/ external professional: cardiology, hospitalist  Consideration for admission or further workup: Using shared decision making with the patient, he would prefer observation overnight and will be admitted to the hospital for telemetry  monitoring and continued rate control Social Determinants of health: N/A    Risk Prescription drug management. Decision regarding hospitalization.          Final Clinical Impression(s) / ED Diagnoses Final diagnoses:  Atrial flutter, unspecified type (HCC)  Rx / DC Orders ED Discharge Orders          Ordered    Amb referral to AFIB Clinic        11/26/21 1415              MitchellvilleKneller, DwaleVictoria K, DO 11/26/21 2301

## 2021-11-26 NOTE — H&P (Addendum)
History and Physical  [name]:[id number] DOB: 1955-06-30 DOA: 11/26/2021  Referring physician: Dr. Dayna Ramus, EDP  PCP: Lise Auer, MD  Outpatient Specialists: Cardiology at the [facility] Patient coming from: Home with EMS  Chief Complaint: Palpitations.   HPI: Jeffrey [name] is a 66 y.o. male with medical history significant for paroxysmal A-fib on Eliquis, history of subarachnoid hemorrhage, essential hypertension, hyperlipidemia, coronary artery disease status post PCI with stenting, gout, peripheral neuropathy, trigeminal neuralgia, carotid artery disease with left carotid artery occlusion, pulmonary nodules, who presented to [facility] ED via EMS from home with complaints of palpitations that started today around 2 AM.  He felt like he was in A-fib so he took an extra dose of his home metoprolol and EMS was activated.  On EMS arrival he was in A-fib with RVR, received 500 cc IV fluid bolus and brought to the ED for further evaluation and management.  He was in his usual state of health prior to this.  No subjective fevers.  No new medications.  Compliant with his home medications.  In the ED, the patient presented with a rate of 116, received 5 mg IV Lopressor with improvement.  EDP discussed the case with cardiology who recommended cardioversion versus admission for observation and further work-up.  The patient declined cardioversion, does not feel comfortable going home until his heart rate is well controlled.  Patient was admitted by [facility], hospitalist service.  ED Course: Tmax 98.0.  BP 118/68, pulse 109, breast respiratory 13, O2 saturation 98% on room air.  CBC and BMP essentially unremarkable  Review of Systems: Review of systems as noted in the HPI. All other systems reviewed and are negative.   Past Medical History:  Diagnosis Date   Aneurysm of infrarenal abdominal aorta (HCC)    CAD (coronary artery disease)    4 stents   Carotid artery disease (HCC)    Left ICA    Hyperlipidemia    Intracerebral hemorrhage (HCC) 1960   Peripheral neuropathy    Past Surgical History:  Procedure Laterality Date   APPENDECTOMY  1960   I & D EXTREMITY Right 06/19/2020   Procedure: IRRIGATION AND DEBRIDEMENT LONG FINGER;  Surgeon: Betha Loa, MD;  Location: [facility] OR;  Service: Orthopedics;  Laterality: Right;   LEFT HEART CATH  06/20/2018   ORIF FEMUR FRACTURE  1988    Social History:  reports that he quit smoking about 9 years ago. His smoking use included cigarettes. He has never used smokeless tobacco. He reports that he does not drink alcohol and does not use drugs.   Allergies  Allergen Reactions   Shellfish-Derived Products Other (See Comments)    GI upset- "this tears up my stomach"    Family History  Problem Relation Age of Onset   Vision loss Mother    Diabetes Mother    Crohn's disease Father       Prior to Admission medications   Medication Sig Start Date End Date Taking? Authorizing Provider  acetaminophen (TYLENOL) 325 MG tablet Take 650 mg by mouth every 6 (six) hours as needed for moderate pain or headache.    [provider]  allopurinol (ZYLOPRIM) 100 MG tablet Take 100 mg by mouth daily. 01/22/21   [provider]  amoxicillin-clavulanate (AUGMENTIN) 875-125 MG tablet Take 1 tablet by mouth See admin instructions. Bid x 10 days    [provider]  apixaban (ELIQUIS) 5 MG TABS tablet Take 5 mg by mouth 2 (two) times  daily.    [provider]  atorvastatin (LIPITOR) 80 MG tablet Take 80 mg by mouth at bedtime. 02/24/17   [provider]  benzonatate (TESSALON) 100 MG capsule Take 100 mg by mouth 2 (two) times daily as needed for cough. 03/04/21   [provider]  citalopram (CELEXA) 20 MG tablet Take 10 mg by mouth daily. 06/17/20   [provider]  colchicine 0.6 MG tablet Take 0.6 mg by mouth as needed (as directed for gout flares).    [provider]  cyclobenzaprine  (FLEXERIL) 10 MG tablet Take 5 mg by mouth at bedtime. 06/17/20   [provider]  famotidine (PEPCID) 20 MG tablet Take 20 mg by mouth 2 (two) times daily.    [provider]  furosemide (LASIX) 20 MG tablet Take 20 mg by mouth in the morning. 11/26/18   [provider]  gabapentin (NEURONTIN) 300 MG capsule Take 300 mg by mouth 2 (two) times daily as needed (pain). 09/17/20   [provider]  HYDROcodone-acetaminophen (NORCO) 7.5-325 MG tablet Take 1 tablet by mouth 3 (three) times daily as needed for moderate pain. 09/17/20   [provider]  lisinopril (ZESTRIL) 5 MG tablet Take 5 mg by mouth daily.    [provider]  magnesium oxide (MAG-OX) 400 MG tablet Take 1 tablet (400 mg total) by mouth daily. 05/16/21   Mesner, Corene Cornea, MD  metoprolol tartrate (LOPRESSOR) 25 MG tablet Take 12.5 mg by mouth 2 (two) times daily. 11/16/14   [provider]  Multiple Vitamins-Minerals (PRESERVISION AREDS 2) CAPS Chew 1 capsule by mouth in the morning and at bedtime.    [provider]  nitroGLYCERIN (NITROSTAT) 0.4 MG SL tablet Place 0.4 mg under the tongue every 5 (five) minutes as needed for chest pain.    [provider]  Omega-3 Fatty Acids (FISH OIL) 1000 MG CAPS Take 1,000 mg by mouth in the morning.    [provider]  omeprazole (PRILOSEC) 20 MG capsule Take 20 mg by mouth daily.    [provider]  potassium chloride SA (KLOR-CON M) 20 MEQ tablet Take 2 tablets (40 mEq total) by mouth daily for 14 days. 05/16/21 05/30/21  Mesner, Corene Cornea, MD  ranolazine (RANEXA) 500 MG 12 hr tablet Take 500 mg by mouth 2 (two) times daily. 07/26/16   [provider]  trimethoprim-polymyxin b (POLYTRIM) ophthalmic solution Place 1 drop into both eyes 4 (four) times daily. Patient not taking: Reported on 03/13/2021 03/04/21   [provider]  zinc gluconate 50 MG tablet Take 50 mg by mouth daily.    [provider]    Physical Exam: BP 118/68   Pulse (!) 48   Temp 98 F (36.7 C) (Oral)   Resp 13   SpO2 98%   General: 66 y.o. year-old male well developed well nourished in no acute distress.  Alert and oriented x3. Cardiovascular: Irregular rate and rhythm with no rubs or gallops.  No thyromegaly or JVD noted.  Trace lower extremity edema. 2/4 pulses in all 4 extremities. Respiratory: Clear to auscultation with no wheezes or rales. Good inspiratory effort. Abdomen: Soft nontender nondistended with normal bowel sounds x4 quadrants. Muskuloskeletal: No cyanosis or clubbing.  Trace lower extremity edema noted bilaterally Neuro: CN II-XII intact, strength, sensation, reflexes Skin: No ulcerative lesions noted or rashes Psychiatry: Judgement and insight appear normal. Mood is appropriate for condition and setting  Labs on Admission:  Basic Metabolic Panel: Recent Labs  Lab 11/26/21 1454  NA 139  K 3.8  CL 107  CO2 25  GLUCOSE 118*  BUN 10  CREATININE 0.92  CALCIUM 8.8*  MG 1.8   Liver Function Tests: No results for input(s): "AST", "ALT", "ALKPHOS", "BILITOT", "PROT", "ALBUMIN" in the last 168 hours. No results for input(s): "LIPASE", "AMYLASE" in the last 168 hours. No results for input(s): "AMMONIA" in the last 168 hours. CBC: Recent Labs  Lab 11/26/21 1454  WBC 7.4  HGB 12.4*  HCT 36.8*  MCV 94.4  PLT 213   Cardiac Enzymes: No results for input(s): "CKTOTAL", "CKMB", "CKMBINDEX", "TROPONINI" in the last 168 hours.  BNP (last 3 results) Recent Labs    03/13/21 0000  BNP 109.6*    ProBNP (last 3 results) No results for input(s): "PROBNP" in the last 8760 hours.  CBG: No results for input(s): "GLUCAP" in the last 168 hours.  Radiological Exams on Admission: No results found.  EKG: I independently viewed the EKG done and my findings are as followed: A flutter with rate of 115 with variable block.  QTc 492.  Assessment/Plan Present on  Admission:  Atrial fibrillation with rapid ventricular response (HCC)  Principal Problem:   Atrial fibrillation with rapid ventricular response (HCC)  Paroxysmal A-fib with rapid ventricular response, unclear trigger No new medications, has been compliant with his home medications Received 5 mg IV Lopressor in the ED, with improvement of his heart rate. Resume home p.o. Lopressor for rate control Resume home Eliquis for CVA prevention Monitor on telemetry TSH 1.1 Obtain port CXR and 2D echo EDP discussed with cardiology Dr. [name]  Hyperlipidemia Resume home Lipitor 80 mg nightly  GERD Resume home PPI and H2 blocker  Gout Resume home allopurinol  Chronic anxiety/depression Resume home Celexa    DVT prophylaxis: Eliquis  Code Status: Full code  Family Communication: None at bedside  Disposition Plan: Admitted to telemetry cardiac unit  Consults called: EDP discussed with cardiology Dr. [name]  Admission status: Observation status.   Status is: Observation     Darlin Drop MD [facility] Pager [phone]  If 7PM-7AM, please contact night-coverage [email or web address] Password [facility]  11/26/2021, 9:24 PM

## 2021-11-27 ENCOUNTER — Observation Stay (HOSPITAL_COMMUNITY): Payer: No Typology Code available for payment source

## 2021-11-27 DIAGNOSIS — I10 Essential (primary) hypertension: Secondary | ICD-10-CM | POA: Diagnosis not present

## 2021-11-27 DIAGNOSIS — I480 Paroxysmal atrial fibrillation: Secondary | ICD-10-CM | POA: Diagnosis not present

## 2021-11-27 DIAGNOSIS — I4891 Unspecified atrial fibrillation: Secondary | ICD-10-CM | POA: Diagnosis not present

## 2021-11-27 DIAGNOSIS — Z7901 Long term (current) use of anticoagulants: Secondary | ICD-10-CM | POA: Diagnosis not present

## 2021-11-27 DIAGNOSIS — I2510 Atherosclerotic heart disease of native coronary artery without angina pectoris: Secondary | ICD-10-CM | POA: Diagnosis not present

## 2021-11-27 LAB — CBC
HCT: 32.8 % — ABNORMAL LOW (ref 39.0–52.0)
Hemoglobin: 11.4 g/dL — ABNORMAL LOW (ref 13.0–17.0)
MCH: 32.6 pg (ref 26.0–34.0)
MCHC: 34.8 g/dL (ref 30.0–36.0)
MCV: 93.7 fL (ref 80.0–100.0)
Platelets: 194 10*3/uL (ref 150–400)
RBC: 3.5 MIL/uL — ABNORMAL LOW (ref 4.22–5.81)
RDW: 13.8 % (ref 11.5–15.5)
WBC: 6.8 10*3/uL (ref 4.0–10.5)
nRBC: 0 % (ref 0.0–0.2)

## 2021-11-27 LAB — ECHOCARDIOGRAM COMPLETE
Area-P 1/2: 4.21 cm2
Calc EF: 59.6 %
Height: 73 in
S' Lateral: 2.8 cm
Single Plane A2C EF: 58.7 %
Single Plane A4C EF: 58.9 %
Weight: 3477.98 oz

## 2021-11-27 LAB — BASIC METABOLIC PANEL
Anion gap: 6 (ref 5–15)
BUN: 12 mg/dL (ref 8–23)
CO2: 25 mmol/L (ref 22–32)
Calcium: 8.4 mg/dL — ABNORMAL LOW (ref 8.9–10.3)
Chloride: 108 mmol/L (ref 98–111)
Creatinine, Ser: 1 mg/dL (ref 0.61–1.24)
GFR, Estimated: >60 mL/min (ref >60)
Glucose, Bld: 111 mg/dL — ABNORMAL HIGH (ref 70–99)
Potassium: 3.6 mmol/L (ref 3.5–5.1)
Sodium: 139 mmol/L (ref 135–145)

## 2021-11-27 LAB — MRSA NEXT GEN BY PCR, NASAL: MRSA by PCR Next Gen: NOT DETECTED

## 2021-11-27 LAB — HIV ANTIBODY (ROUTINE TESTING W REFLEX): HIV Screen 4th Generation wRfx: NONREACTIVE

## 2021-11-27 LAB — PHOSPHORUS: Phosphorus: 3.1 mg/dL (ref 2.5–4.6)

## 2021-11-27 LAB — MAGNESIUM: Magnesium: 1.8 mg/dL (ref 1.7–2.4)

## 2021-11-27 NOTE — Consult Note (Signed)
CARDIOLOGY CONSULT NOTE  Patient ID: Jeffrey [name] MRN: [id number] DOB/AGE: 08-Aug-1955 66 y.o.  Admit date: 11/26/2021 Referring Physician: [facility] Reason for Consultation:  Afib  HPI:   66 y.o. Caucasian male  with paroxysmal Afib/flutter, CAD s/p prior PCI, former smoker, carotid artery disease, infrarenal AAA, remote h/o ICH, peripheral neuropathy  Patient presented to [facility] ER on 11/26/2021 afternoon with tachycardia. He has had prior episodes of Afib with RVR that seem to convert with PO metoprolol. This time it did not, prompting ER visit. He did not convert initially with IV metoprolol in the ER, but eventually converted to sinus rhythm later in the evening and has maintained sinus rhythm since.   At baseline, he is a Cytogeneticist, [place], walks a lot. He does report unchanged exertional dyspnea. He is a 40 PY former smoker, has never had formal PFT or sleep study. He sees a PA at cardiology practice in [place], has seen them within past 1 year. He has had a total of 3 symptomatic episodes of Afib in the past year. He has has 3 stents with what appears to be incidental finding of CAD while being worked up for carotid artery disease. Denies any h/o prior MI. He had SAH in 2002, has not had any recurrence and has tolerated eliquis well without any bleeding issues.   Past Medical History:  Diagnosis Date   Aneurysm of infrarenal abdominal aorta (HCC)    CAD (coronary artery disease)    4 stents   Carotid artery disease (HCC)    Left ICA   Hyperlipidemia    Intracerebral hemorrhage (HCC) 1960   Peripheral neuropathy      Past Surgical History:  Procedure Laterality Date   APPENDECTOMY  1960   I & D EXTREMITY Right 06/19/2020   Procedure: IRRIGATION AND DEBRIDEMENT LONG FINGER;  Surgeon: Betha Loa, MD;  Location: [facility] OR;  Service: Orthopedics;  Laterality: Right;   LEFT HEART CATH  06/20/2018   ORIF FEMUR FRACTURE  1988      Family History  Problem  Relation Age of Onset   Vision loss Mother    Diabetes Mother    Crohn's disease Father      Social History: Social History   Socioeconomic History   Marital status: Married    Spouse name: Danford Bad   Number of children: 2   Years of education: Not on file   Highest education level: Not on file  Occupational History    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  Tobacco Use   Smoking status: Former    Types: Cigarettes    Quit date: 11/19/2012    Years since quitting: 9.0   Smokeless tobacco: Never  Vaping Use   Vaping Use: Never used  Substance and Sexual Activity   Alcohol use: No    Alcohol/week: 0.0 standard drinks of alcohol   Drug use: No   Sexual activity: Not on file  Other Topics Concern   Not on file  Social History Narrative   Lives with wife   Social Determinants of Health   Financial Resource Strain: Not on file  Food Insecurity: No Food Insecurity (11/27/2021)   Hunger Vital Sign    Worried About Running Out of Food in the Last Year: Never true    Ran Out of Food in the Last Year: Never true  Transportation Needs: No Transportation Needs (11/27/2021)   PRAPARE - Administrator, Civil Service (Medical): No  Lack of Transportation (Non-Medical): No  Physical Activity: Not on file  Stress: Not on file  Social Connections: Not on file  Intimate Partner Violence: Not At Risk (11/27/2021)   Humiliation, Afraid, Rape, and Kick questionnaire    Fear of Current or Ex-Partner: No    Emotionally Abused: No    Physically Abused: No    Sexually Abused: No     Medications Prior to Admission  Medication Sig Dispense Refill Last Dose   acetaminophen (TYLENOL) 325 MG tablet Take 650 mg by mouth every 6 (six) hours as needed for moderate pain or headache.   Past Month   allopurinol (ZYLOPRIM) 100 MG tablet Take 100 mg by mouth daily.   11/25/2021   apixaban (ELIQUIS) 5 MG TABS tablet Take 5 mg by mouth 2 (two) times daily.   11/25/2021 at 1900   atorvastatin  (LIPITOR) 80 MG tablet Take 80 mg by mouth at bedtime.   11/25/2021   citalopram (CELEXA) 20 MG tablet Take 10 mg by mouth daily.   11/25/2021   colchicine 0.6 MG tablet Take 0.6 mg by mouth as needed (as directed for gout flares).   unk   famotidine (PEPCID) 20 MG tablet Take 20 mg by mouth 2 (two) times daily.   Past Month   furosemide (LASIX) 20 MG tablet Take 20 mg by mouth in the morning.   11/25/2021   gabapentin (NEURONTIN) 300 MG capsule Take 300 mg by mouth 2 (two) times daily as needed (pain).   Past Week   lisinopril (ZESTRIL) 5 MG tablet Take 5 mg by mouth daily.   11/25/2021   metoprolol tartrate (LOPRESSOR) 25 MG tablet Take 12.5 mg by mouth 2 (two) times daily.  5 11/25/2021 at 1900   Multiple Vitamins-Minerals (PRESERVISION AREDS 2) CAPS Chew 1 capsule by mouth in the morning and at bedtime.   11/25/2021   nitroGLYCERIN (NITROSTAT) 0.4 MG SL tablet Place 0.4 mg under the tongue every 5 (five) minutes as needed for chest pain.   unk   Omega-3 Fatty Acids (FISH OIL) 1000 MG CAPS Take 1,000 mg by mouth in the morning.   11/25/2021   omeprazole (PRILOSEC) 20 MG capsule Take 20 mg by mouth daily.   unk   ranolazine (RANEXA) 500 MG 12 hr tablet Take 500 mg by mouth 2 (two) times daily.   11/25/2021   magnesium oxide (MAG-OX) 400 MG tablet Take 1 tablet (400 mg total) by mouth daily. (Patient not taking: Reported on 11/26/2021) 10 tablet 0 Completed Course   potassium chloride SA (KLOR-CON M) 20 MEQ tablet Take 2 tablets (40 mEq total) by mouth daily for 14 days. (Patient not taking: Reported on 11/26/2021) 28 tablet 0 Completed Course    Review of Systems  Cardiovascular:  Negative for chest pain, dyspnea on exertion, leg swelling, palpitations and syncope.      Physical Exam: Physical Exam Vitals and nursing note reviewed.  Constitutional:      General: He is not in acute distress. Neck:     Vascular: No JVD.  Cardiovascular:     Rate and Rhythm: Normal rate and regular rhythm.      Pulses:          Dorsalis pedis pulses are 1+ on the right side and 0 on the left side.       Posterior tibial pulses are 1+ on the right side and 0 on the left side.     Heart sounds: Normal heart sounds. No murmur heard.  Pulmonary:     Effort: Pulmonary effort is normal.     Breath sounds: Normal breath sounds. No wheezing or rales.        Imaging/tests reviewed and independently interpreted: Lab Results: CBC, BMP  Cardiac Studies:  Telemetry 11/27/2021: No Afib  EKG 11/27/2021: Sinus rhythm 55 bpm  EKG 11/26/2021: Atrial flutter with variable block  Echocardiogram 11/27/2021:  1. Left ventricular ejection fraction, by estimation, is 60 to 65%. The  left ventricle has normal function. The left ventricle has no regional  wall motion abnormalities. Left ventricular diastolic parameters are  indeterminate.   2. Right ventricular systolic function is normal. The right ventricular  size is mildly enlarged.   3. Left atrial size was mildly dilated.   4. Right atrial size was mildly dilated.   5. The mitral valve is abnormal. No evidence of mitral valve  regurgitation. No evidence of mitral stenosis.   6. The aortic valve is normal in structure. Aortic valve regurgitation is  not visualized. No aortic stenosis is present.   7. The inferior vena cava is normal in size with <50% respiratory  variability, suggesting right atrial pressure of 8 mmHg.   Comparison(s): No prior Echocardiogram.    Assessment & Recommendations:  66 y.o. Caucasian male  with paroxysmal Afib/flutter, hypertension, CAD s/p prior PCI, former smoker, carotid artery disease, infrarenal AAA, remote h/o ICH, peripheral neuropathy  Paroxysmal Afib/flutter: Currently in sinus rhythm. Continue baseline rate control management.  Multiple (at least 3) episodes of symptomatic Afib. Consider rhythm control management. Defer it to his primary cardiologist. Echocardiogram shows borderline RA/RV as well as LA  enlargement. With resting heart rate in 50s, no hypoxia, low clinical suspicion for PE. Most likely etiology could be undiagnosed COPD/OSA.  Recommend formal PFT, sleep study outpatient. CHA2DS2VASc score 3, annual stroke risk 3.2%. Continue eliquis 5 mg bid.   Okay to discharge from cardiac standpoint. Continue outpatient cardiology follow up with [facility]   Discussed interpretation of tests and management recommendations with the primary team     Nigel Mormon, MD Pager: [phone] Office: [phone]

## 2021-11-27 NOTE — Progress Notes (Signed)
Echocardiogram 2D Echocardiogram has been performed.  Fidel Levy 11/27/2021, 9:20 AM

## 2021-11-27 NOTE — Discharge Summary (Signed)
Physician Discharge Summary  Jeffrey [name] FTD:[id number] DOB: 1955/05/17 DOA: 11/26/2021  PCP: Lise Auer, MD  Admit date: 11/26/2021 Discharge date: 11/27/2021  Time spent: 40 minutes  Recommendations for Outpatient Follow-up:  Follow outpatient CBC/CMP  Needs outpatient sleep study/PFTs   Discharge Diagnoses:  Principal Problem:   Atrial fibrillation with rapid ventricular response [facility])   Discharge Condition: stable  Diet recommendation: heart healthy  Filed Weights   11/26/21 2238 11/27/21 0612  Weight: 98.2 kg 98.6 kg    History of present illness:   Jeffrey [name] is Jeffrey [name] 66 y.o. male with medical history significant for paroxysmal Jeffrey [name]-fib on Eliquis, history of subarachnoid hemorrhage, essential hypertension, hyperlipidemia, coronary artery disease status post PCI with stenting, gout, peripheral neuropathy, trigeminal neuralgia, carotid artery disease with left carotid artery occlusion, pulmonary nodules, who presented to [facility] ED via EMS from home with complaints of palpitations that started today around 2 AM.  He felt like he was in Jeffrey [name]-fib so he took an extra dose of his home metoprolol and EMS was activated.  On EMS arrival he was in [name]-fib with RVR, received 500 cc IV fluid bolus and brought to the ED for further evaluation and management.  He was in his usual state of health prior to this.  No subjective fevers.  No new medications.  Compliant with his home medications.   In the ED, the patient presented with Jeffrey [name] rate of 116, received 5 mg IV Lopressor with improvement.  EDP discussed the case with cardiology who recommended cardioversion versus admission for observation and further work-up.  The patient declined cardioversion, does not feel comfortable going home until his heart rate is well controlled.  Patient was admitted by [name], hospitalist service.   ED Course: Tmax 98.0.  BP 118/68, pulse 109, breast respiratory 13, O2 saturation 98% on room air.  CBC and BMP essentially  unremarkable  He was admitted for afib with RVR.  Rate improved after IV metop.  He's currently in sinus rhythm.  Ok for discharge.  Hospital Course:  Assessment and Plan:  Paroxysmal Jeffrey [name]-fib with rapid ventricular response, unclear trigger No new medications, has been compliant with his home medications Received 5 mg IV Lopressor in the ED, with improvement of his heart rate. Now back in sinus rhythm Resume home p.o. Lopressor for rate control Resume home Eliquis for CVA prevention Monitor on telemetry TSH 1.1 Obtain port CXR wnl and 2D echo (EF 60-65%, no RWMA, IVC normal with <50% resp variability) EDP discussed with cardiology Dr. Joaquim [name], ok with d/c from cardiac standpoint -> needs sleep study, PFT's outpatient   Hyperlipidemia Resume home Lipitor 80 mg nightly   GERD Resume home PPI and H2 blocker   Gout Resume home allopurinol   Chronic anxiety/depression Resume home Jeffrey [name]     Procedures: Echo IMPRESSIONS     1. Left ventricular ejection fraction, by estimation, is 60 to 65%. The  left ventricle has normal function. The left ventricle has no regional  wall motion abnormalities. Left ventricular diastolic parameters are  indeterminate.   2. Right ventricular systolic function is normal. The right ventricular  size is mildly enlarged.   3. Left atrial size was mildly dilated.   4. Right atrial size was mildly dilated.   5. The mitral valve is abnormal. No evidence of mitral valve  regurgitation. No evidence of mitral stenosis.   6. The aortic valve is normal in structure. Aortic valve regurgitation is  not visualized. No aortic stenosis is present.  7. The inferior vena cava is normal in size with <50% respiratory  variability, suggesting right atrial pressure of 8 mmHg.   Comparison(s): No prior Echocardiogram.    Consultations: cardiology  Discharge Exam: Vitals:   11/27/21 0333 11/27/21 0754  BP:  122/74  Pulse:  (!) 57  Resp:    Temp: 97.7  F (36.5 C) 97.8 F (36.6 C)  SpO2:  99%   Jeffrey [name] to discharge  General: No acute distress. Cardiovascular: Heart sounds show Jeffrey [name] regular rate, and rhythm. No gallops or rubs. No murmurs. No JVD. Lungs: unlabored Neurological: Alert and oriented 3. Moves all extremities 4 with equal strength. Cranial nerves II through XII grossly intact. Extremities: No clubbing or cyanosis. No edema.   Discharge Instructions   Discharge Instructions     Amb referral to AFIB Clinic   Complete by: As directed    Call MD for:  difficulty breathing, headache or visual disturbances   Complete by: As directed    Call MD for:  extreme fatigue   Complete by: As directed    Call MD for:  hives   Complete by: As directed    Call MD for:  persistant dizziness or light-headedness   Complete by: As directed    Call MD for:  persistant nausea and vomiting   Complete by: As directed    Call MD for:  redness, tenderness, or signs of infection (pain, swelling, redness, odor or green/yellow discharge around incision site)   Complete by: As directed    Call MD for:  severe uncontrolled pain   Complete by: As directed    Call MD for:  temperature >100.4   Complete by: As directed    Diet - low sodium heart healthy   Complete by: As directed    Discharge instructions   Complete by: As directed    You were seen for atrial fibrillation with rapid response.  You've converted back into sinus rhythm.   We'll discharge you with Jeffrey [name] plan to follow up with cardiology outpatient.  You should have Jeffrey Vandenberghe sleep study and PFT's outpatient (pulmonary function tests).  Follow with your PCP.  Return for new, recurrent, or worsening symptoms.  Please ask your PCP to request records from this hospitalization so they know what was done and what the next steps will be.   Increase activity slowly   Complete by: As directed       Allergies as of 11/27/2021       Reactions   Shellfish-derived Products Other (See Comments)    GI upset- "this tears up my stomach"        Medication List     TAKE these medications    acetaminophen 325 MG tablet Commonly known as: TYLENOL Take 650 mg by mouth every 6 (six) hours as needed for moderate pain or headache.   allopurinol 100 MG tablet Commonly known as: ZYLOPRIM Take 100 mg by mouth daily.   atorvastatin 80 MG tablet Commonly known as: LIPITOR Take 80 mg by mouth at bedtime.   citalopram 20 MG tablet Commonly known as: Jeffrey [name] Take 10 mg by mouth daily.   colchicine 0.6 MG tablet Take 0.6 mg by mouth as needed (as directed for gout flares).   Eliquis 5 MG Tabs tablet Generic drug: apixaban Take 5 mg by mouth 2 (two) times daily.   famotidine 20 MG tablet Commonly known as: PEPCID Take 20 mg by mouth 2 (two) times daily.   Fish Oil 1000 MG Caps Take  1,000 mg by mouth in the morning.   furosemide 20 MG tablet Commonly known as: LASIX Take 20 mg by mouth in the morning.   gabapentin 300 MG capsule Commonly known as: NEURONTIN Take 300 mg by mouth 2 (two) times daily as needed (pain).   lisinopril 5 MG tablet Commonly known as: ZESTRIL Take 5 mg by mouth daily.   magnesium oxide 400 MG tablet Commonly known as: MAG-OX Take 1 tablet (400 mg total) by mouth daily.   metoprolol tartrate 25 MG tablet Commonly known as: LOPRESSOR Take 12.5 mg by mouth 2 (two) times daily.   nitroGLYCERIN 0.4 MG SL tablet Commonly known as: NITROSTAT Place 0.4 mg under the tongue every 5 (five) minutes as needed for chest pain.   omeprazole 20 MG capsule Commonly known as: PRILOSEC Take 20 mg by mouth daily.   potassium chloride SA 20 MEQ tablet Commonly known as: KLOR-CON M Take 2 tablets (40 mEq total) by mouth daily for 14 days.   PreserVision AREDS 2 Caps Chew 1 capsule by mouth in the morning and at bedtime.   ranolazine 500 MG 12 hr tablet Commonly known as: RANEXA Take 500 mg by mouth 2 (two) times daily.       Allergies  Allergen  Reactions   Shellfish-Derived Products Other (See Comments)    GI upset- "this tears up my stomach"      The results of significant diagnostics from this hospitalization (including imaging, microbiology, ancillary and laboratory) are listed below for reference.    Significant Diagnostic Studies: ECHOCARDIOGRAM COMPLETE  Result Date: 11/27/2021    ECHOCARDIOGRAM REPORT   Patient Name:   Jeffrey StairsJOHNNY M [name] Date of Exam: 11/27/2021 Medical Rec #:  [id number]       Height:       73.0 in Accession #:    [id number]      Weight:       217.4 lb Date of Birth:  1955-12-20       BSA:          2.229 m Patient Age:    66 years        BP:           105/63 mmHg Patient Gender: M               HR:           60 bpm. Exam Location:  Inpatient Procedure: 2D Echo, Cardiac Doppler and Color Doppler Indications:    I48.91* Unspeicified atrial fibrillation  History:        Patient has no prior history of Echocardiogram examinations.                 CAD, Aneurysm of infrarenal abdominal aorta; Risk                 Factors:Dyslipidemia.  Sonographer:    Eulah PontSarah Pirrotta RDCS Referring Phys: [id number] CAROLE N HALL IMPRESSIONS  1. Left ventricular ejection fraction, by estimation, is 60 to 65%. The left ventricle has normal function. The left ventricle has no regional wall motion abnormalities. Left ventricular diastolic parameters are indeterminate.  2. Right ventricular systolic function is normal. The right ventricular size is mildly enlarged.  3. Left atrial size was mildly dilated.  4. Right atrial size was mildly dilated.  5. The mitral valve is abnormal. No evidence of mitral valve regurgitation. No evidence of mitral stenosis.  6. The aortic valve is normal in structure. Aortic valve regurgitation is not visualized. No  aortic stenosis is present.  7. The inferior vena cava is normal in size with <50% respiratory variability, suggesting right atrial pressure of 8 mmHg. Comparison(s): No prior Echocardiogram. FINDINGS  Left  Ventricle: Left ventricular ejection fraction, by estimation, is 60 to 65%. The left ventricle has normal function. The left ventricle has no regional wall motion abnormalities. The left ventricular internal cavity size was normal in size. There is  no left ventricular hypertrophy. Left ventricular diastolic parameters are indeterminate. Right Ventricle: The right ventricular size is mildly enlarged. No increase in right ventricular wall thickness. Right ventricular systolic function is normal. Left Atrium: Left atrial size was mildly dilated. Right Atrium: Right atrial size was mildly dilated. Pericardium: There is no evidence of pericardial effusion. Mitral Valve: The mitral valve is abnormal. There is mild thickening of the mitral valve leaflet(s). No evidence of mitral valve regurgitation. No evidence of mitral valve stenosis. Tricuspid Valve: The tricuspid valve is normal in structure. Tricuspid valve regurgitation is not demonstrated. No evidence of tricuspid stenosis. Aortic Valve: The aortic valve is normal in structure. Aortic valve regurgitation is not visualized. No aortic stenosis is present. Pulmonic Valve: The pulmonic valve was normal in structure. Pulmonic valve regurgitation is not visualized. No evidence of pulmonic stenosis. Aorta: The aortic root is normal in size and structure. Venous: The inferior vena cava is normal in size with less than 50% respiratory variability, suggesting right atrial pressure of 8 mmHg. IAS/Shunts: The interatrial septum was not assessed.  LEFT VENTRICLE PLAX 2D LVIDd:         4.80 cm      Diastology LVIDs:         2.80 cm      LV e' medial:    9.20 cm/s LV PW:         1.10 cm      LV E/e' medial:  11.5 LV IVS:        0.90 cm      LV e' lateral:   8.85 cm/s LVOT diam:     2.10 cm      LV E/e' lateral: 12.0 LV SV:         68 LV SV Index:   30 LVOT Area:     3.46 cm  LV Volumes (MOD) LV vol d, MOD A2C: 106.0 ml LV vol d, MOD A4C: 114.0 ml LV vol s, MOD A2C: 43.8 ml LV  vol s, MOD A4C: 46.9 ml LV SV MOD A2C:     62.2 ml LV SV MOD A4C:     114.0 ml LV SV MOD BP:      68.1 ml RIGHT VENTRICLE RV S prime:     14.50 cm/s TAPSE (M-mode): 2.3 cm LEFT ATRIUM             Index        RIGHT ATRIUM           Index LA diam:        3.50 cm 1.57 cm/m   RA Area:     22.40 cm LA Vol (A2C):   79.4 ml 35.62 ml/m  RA Volume:   63.10 ml  28.31 ml/m LA Vol (A4C):   80.9 ml 36.29 ml/m LA Biplane Vol: 80.2 ml 35.98 ml/m  AORTIC VALVE LVOT Vmax:   84.90 cm/s LVOT Vmean:  56.100 cm/s LVOT VTI:    0.195 m  AORTA Ao Root diam: 3.40 cm Ao Asc diam:  3.20 cm MITRAL VALVE MV Area (PHT): 4.21  cm     SHUNTS MV Decel Time: 180 msec     Systemic VTI:  0.20 m MV E velocity: 106.00 cm/s  Systemic Diam: 2.10 cm MV [place] velocity: 41.50 cm/s MV E/Anastasija Anfinson ratio:  2.55 Manish Patwardhan MD Electronically signed by Truett Mainland MD Signature Date/Time: 11/27/2021/10:58:12 AM    Final    DG CHEST PORT 1 VIEW  Result Date: 11/26/2021 CLINICAL DATA:  Atrial fibrillation EXAM: PORTABLE CHEST 1 VIEW COMPARISON:  Chest x-ray 03/13/2021 FINDINGS: The heart size and mediastinal contours are within normal limits. Both lungs are clear. The visualized skeletal structures are unremarkable. IMPRESSION: No active disease. Electronically Signed   By: Darliss Cheney M.D.   On: 11/26/2021 22:30    Microbiology: Recent Results (from the past 240 hour(s))  MRSA Next Gen by PCR, Nasal     Status: None   Collection Time: 11/26/21 11:10 PM   Specimen: Nasal Mucosa; Nasal Swab  Result Value Ref Range Status   MRSA by PCR Next Gen NOT DETECTED NOT DETECTED Final    Comment: (NOTE) The GeneXpert MRSA Assay (FDA approved for NASAL specimens only), is one component of Adron Geisel comprehensive MRSA colonization surveillance program. It is not intended to diagnose MRSA infection nor to guide or monitor treatment for MRSA infections. Test performance is not FDA approved in patients less than 47 years old. Performed at [facility],  [address] [place], [place] [address]      Labs: Basic Metabolic Panel: Recent Labs  Lab 11/26/21 1454 11/27/21 0656  NA 139 139  K 3.8 3.6  CL 107 108  CO2 25 25  GLUCOSE 118* 111*  BUN 10 12  CREATININE 0.92 1.00  CALCIUM 8.8* 8.4*  MG 1.8 1.8  PHOS  --  3.1   Liver Function Tests: No results for input(s): "AST", "ALT", "ALKPHOS", "BILITOT", "PROT", "ALBUMIN" in the last 168 hours. No results for input(s): "LIPASE", "AMYLASE" in the last 168 hours. No results for input(s): "AMMONIA" in the last 168 hours. CBC: Recent Labs  Lab 11/26/21 1454 11/27/21 0656  WBC 7.4 6.8  HGB 12.4* 11.4*  HCT 36.8* 32.8*  MCV 94.4 93.7  PLT 213 194   Cardiac Enzymes: No results for input(s): "CKTOTAL", "CKMB", "CKMBINDEX", "TROPONINI" in the last 168 hours. BNP: BNP (last 3 results) Recent Labs    03/13/21 0000  BNP 109.6*    ProBNP (last 3 results) No results for input(s): "PROBNP" in the last 8760 hours.  CBG: No results for input(s): "GLUCAP" in the last 168 hours.     Signed:  Lacretia Nicks MD.  [facility] 11/27/2021, 2:16 PM

## 2021-11-27 NOTE — Progress Notes (Signed)
Went over discharge paper with patient and wife. Answered all questions. PIV removed. All belongings at bedside.

## 2021-11-30 ENCOUNTER — Ambulatory Visit (HOSPITAL_COMMUNITY)
Admission: RE | Admit: 2021-11-30 | Discharge: 2021-11-30 | Disposition: A | Discharge status: Home / Self Care | Payer: No Typology Code available for payment source | Source: Ambulatory Visit | Attending: Orthopedic Surgery | Admitting: Orthopedic Surgery

## 2021-11-30 DIAGNOSIS — M25562 Pain in left knee: Secondary | ICD-10-CM | POA: Insufficient documentation

## 2021-11-30 DIAGNOSIS — S72452A Displaced supracondylar fracture without intracondylar extension of lower end of left femur, initial encounter for closed fracture: Secondary | ICD-10-CM | POA: Diagnosis present

## 2022-01-11 ENCOUNTER — Ambulatory Visit (INDEPENDENT_AMBULATORY_CARE_PROVIDER_SITE_OTHER): Payer: No Typology Code available for payment source

## 2022-01-11 ENCOUNTER — Ambulatory Visit (INDEPENDENT_AMBULATORY_CARE_PROVIDER_SITE_OTHER): Payer: No Typology Code available for payment source | Admitting: Orthopaedic Surgery

## 2022-01-11 ENCOUNTER — Telehealth: Payer: Self-pay | Admitting: Radiology

## 2022-01-11 ENCOUNTER — Encounter: Payer: Self-pay | Admitting: Orthopaedic Surgery

## 2022-01-11 DIAGNOSIS — M898X5 Other specified disorders of bone, thigh: Secondary | ICD-10-CM | POA: Diagnosis not present

## 2022-01-11 MED ORDER — OXYCODONE HCL 5 MG PO TABS: 5.0000 mg | ORAL_TABLET | Freq: Three times a day (TID) | ORAL | 0 refills | Status: DC | PRN

## 2022-01-11 NOTE — Telephone Encounter (Signed)
Yes that's fine 

## 2022-01-11 NOTE — Progress Notes (Signed)
Office Visit Note   Patient: Jeffrey [name]           Date of Birth: 03/02/1956           MRN: [id number] Visit Date: 01/11/2022              Requested by: Mateo Flow, MD [place],  [place] [id number] PCP: Mateo Flow, MD   Assessment & Plan: Visit Diagnoses:  1. Pain of left femur     Plan: Patient is now 1 week status post IM nail left femur fracture.  He had a previous injury to the distal femur and had hardware that had to be removed as well.  Does look like he has some pre-existing posttraumatic arthritis that may be aggravated by the recent injury and surgery.  I explained that the main issue with the pain is due to the swelling in his leg.  Stressed the importance of elevation and compression.  Provided them with a prescription for compression socks and Ace bandages.  Oxycodone refilled.  He can continue to weight-bear as tolerated.  We will order home health PT.  Recheck in 4 weeks with two-view x-rays of the left femur.  Follow-Up Instructions: No follow-ups on file.   Orders:  Orders Placed This Encounter  Procedures   XR FEMUR MIN 2 VIEWS LEFT   Meds ordered this encounter  Medications   oxyCODONE (OXY IR/ROXICODONE) 5 MG immediate release tablet    Sig: Take 1-2 tablets (5-10 mg total) by mouth every 8 (eight) hours as needed for severe pain.    Dispense:  30 tablet    Refill:  0      Procedures: No procedures performed   Clinical Data: No additional findings.   Subjective: Chief Complaint  Patient presents with   Left Hip - Injury    DOI 01/04/2022    HPI Jeffrey [name] is a very pleasant 66 year old gentleman from [place] who underwent IM nail of a left femur fracture last week while he was on vacation at the [employer].  Surgery was performed at the local hospital.  He comes in for his first follow-up visit. He reports constant pain that is global in the left lower extremity.  Review of Systems  Constitutional: Negative.    All other systems reviewed and are negative.    Objective: Vital Signs: There were no vitals taken for this visit.  Physical Exam Vitals and nursing note reviewed.  Constitutional:      Appearance: He is well-developed.  HENT:     Head: Normocephalic and atraumatic.  Eyes:     Pupils: Pupils are equal, round, and reactive to light.  Pulmonary:     Effort: Pulmonary effort is normal.  Abdominal:     Palpations: Abdomen is soft.  Musculoskeletal:        General: Normal range of motion.     Cervical back: Neck supple.  Skin:    General: Skin is warm.  Neurological:     Mental Status: He is alert and oriented to person, place, and time.  Psychiatric:        Behavior: Behavior normal.        Thought Content: Thought content normal.        Judgment: Judgment normal.     Ortho Exam Examination of the left lower extremity shows moderate to severe diffuse swelling and pitting edema.  Negative Homans sign.  No calf tenderness.  Surgical incisions are intact without  any signs of infection or drainage.  He has diffuse swelling to the knee as well. Specialty Comments:  No specialty comments available.  Imaging: No results found.   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[place]) 06/19/2020   Occlusion and stenosis of carotid artery without mention of cerebral infarction 11/19/2013   Past Medical History:  Diagnosis Date   Aneurysm of infrarenal abdominal aorta (HCC)    CAD (coronary artery disease)    4 stents   Carotid artery disease (HCC)    Left ICA   Hyperlipidemia    Intracerebral hemorrhage (HCC) 1960   Peripheral neuropathy     Family History  Problem Relation Age of Onset   Vision loss Mother    Diabetes Mother    Crohn's disease Father     Past Surgical History:  Procedure Laterality Date   APPENDECTOMY  1960   I & D EXTREMITY Right  06/19/2020   Procedure: IRRIGATION AND DEBRIDEMENT LONG FINGER;  Surgeon: Leanora Cover, MD;  Location: [facility];  Service: Orthopedics;  Laterality: Right;   LEFT HEART CATH  06/20/2018   ORIF FEMUR FRACTURE  1988   Social History   Occupational History    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

## 2022-01-11 NOTE — Telephone Encounter (Signed)
Called and left message on machine. [facility]. This was also included on the script that was faxed to the [place].

## 2022-01-19 ENCOUNTER — Telehealth: Payer: Self-pay | Admitting: Orthopaedic Surgery

## 2022-01-19 ENCOUNTER — Other Ambulatory Visit: Payer: Self-pay | Admitting: Physician Assistant

## 2022-01-19 MED ORDER — OXYCODONE HCL 5 MG PO TABS: 5.0000 mg | ORAL_TABLET | Freq: Two times a day (BID) | ORAL | 0 refills | Status: DC | PRN

## 2022-01-19 NOTE — Telephone Encounter (Signed)
sent 

## 2022-01-19 NOTE — Telephone Encounter (Signed)
Patient's wife Danford Bad called advised patient need Rx refilled Oxycodone. [facility] uses [facility] [place]   The number to contact Pharmacy is Ph# [phone]   The number to contact patient is [phone]

## 2022-02-08 ENCOUNTER — Encounter: Payer: Self-pay | Admitting: Orthopaedic Surgery

## 2022-02-08 ENCOUNTER — Ambulatory Visit (INDEPENDENT_AMBULATORY_CARE_PROVIDER_SITE_OTHER): Payer: No Typology Code available for payment source

## 2022-02-08 ENCOUNTER — Ambulatory Visit (INDEPENDENT_AMBULATORY_CARE_PROVIDER_SITE_OTHER): Payer: No Typology Code available for payment source | Admitting: Orthopaedic Surgery

## 2022-02-08 DIAGNOSIS — M898X5 Other specified disorders of bone, thigh: Secondary | ICD-10-CM

## 2022-02-08 NOTE — Progress Notes (Signed)
Office Visit Note   Patient: Jeffrey [name]           Date of Birth: [birth year]           MRN: [id number] Visit Date: 02/08/2022              Requested by: Lise Auer, MD [address] [place],  [place] [address] PCP: Lise Auer, MD   Assessment & Plan: Visit Diagnoses:  1. Pain of left femur     Plan: Impression is approximately 5 weeks status post left hip IM nail from a femoral shaft fracture.  Patient is clinically doing well.  He will continue to work with home health physical therapy.  We will have him follow-up in 7 weeks for repeat evaluation and x-rays of the left femur.  We may consider ordering bone stimulator at that time.  Call with concerns or questions.  Follow-Up Instructions: No follow-ups on file.   Orders:  Orders Placed This Encounter  Procedures   XR FEMUR MIN 2 VIEWS LEFT   No orders of the defined types were placed in this encounter.     Procedures: No procedures performed   Clinical Data: No additional findings.   Subjective: Chief Complaint  Patient presents with   Left Leg - Follow-up    Femur fracture    HPI patient is a pleasant 66 year old gentleman who comes in today approximately 5 weeks status post left hip IM nail for midshaft femur fracture that was surgically fixed out of town while on vacation.  Patient has been doing well.  He is taking Tylenol and occasional oxycodone for pain.  He has been getting home health physical therapy and is ambulating primarily with a walker.  He is on chronic Eliquis.  He is on vitamin D as well.     Objective: Vital Signs: There were no vitals taken for this visit.    Ortho Exam left hip exam reveals a fully healed surgical incision without complication.  Does have a small scab to the most proximal aspect.  He is able to fully extend his knee and has good hip flexion.  He does still lack strength to the left lower extremity.  He is neurovascular intact distally.  Specialty Comments:   No specialty comments available.  Imaging: No results found.   PMFS History: Patient Active Problem List   Diagnosis Date Noted   Atrial fibrillation with rapid ventricular response (HCC) 11/26/2021   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   Occlusion and stenosis of carotid artery without mention of cerebral infarction 11/19/2013   Past Medical History:  Diagnosis Date   Aneurysm of infrarenal abdominal aorta (HCC)    CAD (coronary artery disease)    4 stents   Carotid artery disease (HCC)    Left ICA   Hyperlipidemia    Intracerebral hemorrhage (HCC) 1960   Peripheral neuropathy     Family History  Problem Relation Age of Onset   Vision loss Mother    Diabetes Mother    Crohn's disease Father     Past Surgical History:  Procedure Laterality Date   APPENDECTOMY  1960   I & D EXTREMITY Right 06/19/2020   Procedure: IRRIGATION AND DEBRIDEMENT LONG FINGER;  Surgeon: Betha Loa, MD;  Location: [facility];  Service: Orthopedics;  Laterality: Right;   LEFT HEART CATH  06/20/2018   ORIF FEMUR FRACTURE  1988   Social History   Occupational History    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

## 2022-03-04 ENCOUNTER — Ambulatory Visit: Payer: No Typology Code available for payment source | Admitting: Physician Assistant

## 2022-03-16 ENCOUNTER — Ambulatory Visit (INDEPENDENT_AMBULATORY_CARE_PROVIDER_SITE_OTHER): Payer: No Typology Code available for payment source

## 2022-03-16 ENCOUNTER — Ambulatory Visit (INDEPENDENT_AMBULATORY_CARE_PROVIDER_SITE_OTHER): Payer: No Typology Code available for payment source | Admitting: Orthopaedic Surgery

## 2022-03-16 ENCOUNTER — Encounter: Payer: Self-pay | Admitting: Orthopaedic Surgery

## 2022-03-16 DIAGNOSIS — M898X5 Other specified disorders of bone, thigh: Secondary | ICD-10-CM | POA: Diagnosis not present

## 2022-03-16 NOTE — Progress Notes (Signed)
Office Visit Note   Patient: Jeffrey [name]           Date of Birth: 1955/06/07           MRN: [id number] Visit Date: 03/16/2022              Requested by: Mateo Flow, MD [place],  [place] [address] PCP: Mateo Flow, MD   Assessment & Plan: Visit Diagnoses:  1. Pain of left femur     Plan: In regards to the fracture this is healing well.  He can continue to weight-bear as tolerated.  I feel that his knee pain is the result of quadriceps weakness and patella maltracking which I explained should improve as he gets stronger.  Symptomatic treatment recommended.  Follow-up in 6 weeks with repeat two-view x-rays of the left femur.  Follow-Up Instructions: Return in about 6 weeks (around 04/27/2022).   Orders:  Orders Placed This Encounter  Procedures   XR FEMUR MIN 2 VIEWS LEFT   No orders of the defined types were placed in this encounter.     Procedures: No procedures performed   Clinical Data: No additional findings.   Subjective: Chief Complaint  Patient presents with   Left Leg - Follow-up    S/p left hip IM nailing of femur    HPI Mr. Jeffrey [name] returns today 9 weeks status post IM nail for left femur fracture.  He is complaining of left knee pain.  Denies any injuries.  He is still using a walker.  Review of Systems   Objective: Vital Signs: There were no vitals taken for this visit.  Physical Exam  Ortho Exam Examination of the left thigh and knee shows fully healed surgical scars.  There is no evidence of infection.  He has crepitus with range of motion of the knee.  No joint effusion.  No real joint line tenderness. Specialty Comments:  No specialty comments available.  Imaging: XR FEMUR MIN 2 VIEWS LEFT  Result Date: 03/16/2022 X-rays demonstrate continued fracture healing.  Alignment is unchanged.  Stable hardware without any complications.    PMFS History: Patient Active Problem List   Diagnosis Date Noted   Atrial  fibrillation with rapid ventricular response ([place]) 11/26/2021   Infectious arthropathy ([place]) 06/19/2020   Occlusion and stenosis of carotid artery without mention of cerebral infarction 11/19/2013   Past Medical History:  Diagnosis Date   Aneurysm of infrarenal abdominal aorta (HCC)    CAD (coronary artery disease)    4 stents   Carotid artery disease (HCC)    Left ICA   Hyperlipidemia    Intracerebral hemorrhage (HCC) 1960   Peripheral neuropathy     Family History  Problem Relation Age of Onset   Vision loss Mother    Diabetes Mother    Crohn's disease Father     Past Surgical History:  Procedure Laterality Date   APPENDECTOMY  1960   I & D EXTREMITY Right 06/19/2020   Procedure: IRRIGATION AND DEBRIDEMENT LONG FINGER;  Surgeon: Leanora Cover, MD;  Location: [facility];  Service: Orthopedics;  Laterality: Right;   LEFT HEART CATH  06/20/2018   ORIF FEMUR FRACTURE  1988   Social History   Occupational History    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

## 2022-03-23 ENCOUNTER — Telehealth: Payer: Self-pay | Admitting: Orthopaedic Surgery

## 2022-03-23 NOTE — Telephone Encounter (Signed)
03/16/22 ov note faxed to [facility] [phone]

## 2022-03-29 ENCOUNTER — Ambulatory Visit: Payer: No Typology Code available for payment source | Admitting: Orthopaedic Surgery

## 2022-04-27 ENCOUNTER — Encounter: Payer: Self-pay | Admitting: Orthopaedic Surgery

## 2022-04-27 ENCOUNTER — Ambulatory Visit (INDEPENDENT_AMBULATORY_CARE_PROVIDER_SITE_OTHER): Payer: No Typology Code available for payment source

## 2022-04-27 ENCOUNTER — Ambulatory Visit (INDEPENDENT_AMBULATORY_CARE_PROVIDER_SITE_OTHER): Payer: No Typology Code available for payment source | Admitting: Orthopaedic Surgery

## 2022-04-27 DIAGNOSIS — M898X5 Other specified disorders of bone, thigh: Secondary | ICD-10-CM | POA: Diagnosis not present

## 2022-04-27 DIAGNOSIS — S72352K Displaced comminuted fracture of shaft of left femur, subsequent encounter for closed fracture with nonunion: Secondary | ICD-10-CM

## 2022-04-27 MED ORDER — CALCIUM CARBONATE 1500 (600 CA) MG PO TABS: 1500.0000 mg | ORAL_TABLET | Freq: Every day | ORAL | 6 refills | Status: AC

## 2022-04-27 MED ORDER — VITAMIN D (ERGOCALCIFEROL) 1.25 MG (50000 UNIT) PO CAPS: 50000.0000 [IU] | ORAL_CAPSULE | ORAL | 2 refills | Status: DC

## 2022-04-27 NOTE — Progress Notes (Signed)
Office Visit Note   Patient: Jeffrey [name]           Date of Birth: August 18, 1955           MRN: [id number] Visit Date: 04/27/2022              Requested by: Mateo Flow, MD [place],   [id number] PCP: Mateo Flow, MD   Assessment & Plan: Visit Diagnoses:  1. 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  His vitamin D level was low when it was checked at the [place] recently.  I have ordered calcium and vitamin D supplements.  Will order bone stimulator.  Will see him back in 2 months for recheck with two-view x-rays of the left femur.  Follow-Up Instructions: Return in about 2 months (around 06/26/2022).   Orders:  Orders Placed This Encounter  Procedures   XR FEMUR MIN 2 VIEWS LEFT   Meds ordered this encounter  Medications   Vitamin D, Ergocalciferol, (DRISDOL) 1.25 MG (50000 UNIT) CAPS capsule    Sig: Take 1 capsule (50,000 Units total) by mouth every 7 (seven) days.    Dispense:  5 capsule    Refill:  2   calcium carbonate (OSCAL) 1500 (600 Ca) MG TABS tablet    Sig: Take 1 tablet (1,500 mg total) by mouth daily.    Dispense:  30 tablet    Refill:  6      Procedures: No procedures performed   Clinical Data: No additional findings.   Subjective: Chief Complaint  Patient presents with   Left Leg - Follow-up    IM nailing of femur    HPI  Patient follows up today for left femur fracture.  He is about 4 months from the injury at this point.  He is doing home exercises and continues to use a walker.  Review of Systems   Objective: Vital Signs: There were no vitals taken for this visit.  Physical Exam  Ortho Exam  Examination of the left lower extremity is unchanged.  Specialty Comments:  No specialty comments available.  Imaging: XR FEMUR MIN 2 VIEWS LEFT  Result Date: 04/27/2022 X-rays demonstrate slight improvement in  fracture healing.  No hardware complications.  Persistent fracture lucency.    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[place]) 06/19/2020   Occlusion and stenosis of carotid artery without mention of cerebral infarction 11/19/2013   Past Medical History:  Diagnosis Date   Aneurysm of infrarenal abdominal aorta (HCC)    CAD (coronary artery disease)    4 stents   Carotid artery disease (HCC)    Left ICA   Hyperlipidemia    Intracerebral hemorrhage (HCC) 1960   Peripheral neuropathy     Family History  Problem Relation Age of Onset   Vision loss Mother    Diabetes Mother    Crohn's disease Father     Past Surgical History:  Procedure Laterality Date   APPENDECTOMY  1960   I & D EXTREMITY Right 06/19/2020   Procedure: IRRIGATION AND DEBRIDEMENT LONG FINGER;  Surgeon: Leanora Cover, MD;  Location: [facility];  Service: Orthopedics;  Laterality: Right;   LEFT HEART CATH  06/20/2018   ORIF FEMUR FRACTURE  1988   Social History   Occupational  History    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

## 2022-06-26 NOTE — Progress Notes (Unsigned)
Office Visit Note   Patient: Jeffrey [name]           Date of Birth: 01/10/1956           MRN: [id number] Visit Date: 06/28/2022              Requested by: Lise Auer, MD [address] [place],  [place] [address] PCP: Lise Auer, MD   Assessment & Plan: Visit Diagnoses:  1. 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  Clinically he is improving as well.  At this point we will see him back in 3 months with two-view x-rays left femur.  Follow-Up Instructions: Return in about 3 months (around 09/27/2022).   Orders:  Orders Placed This Encounter  Procedures   XR FEMUR MIN 2 VIEWS LEFT   No orders of the defined types were placed in this encounter.     Procedures: No procedures performed   Clinical Data: No additional findings.   Subjective: Chief Complaint  Patient presents with   Left Leg - Follow-up    Femur fracture    HPI  Patient returns today 6 months status post IM nail left femur that was done at outside hospital.  We have been following the fracture with x-rays.  Clinically he is feeling much better.  He has now progressed to a cane.  He is not taking any pain medications.  Review of Systems   Objective: Vital Signs: There were no vitals taken for this visit.  Physical Exam  Ortho Exam  Examination left femur shows no pain with movement of the leg or palpation.  There is no swelling.  Is fully healed surgical scars.  Specialty Comments:  No specialty comments available.  Imaging: XR FEMUR MIN 2 VIEWS LEFT  Result Date: 06/28/2022 2 view x-rays of the left femur shows improvement in fracture consolidation and callus formation.    PMFS History: Patient Active Problem List   Diagnosis Date Noted   Atrial fibrillation with rapid ventricular response 11/26/2021   Infectious arthropathy 06/19/2020   Occlusion and stenosis of  carotid artery without mention of cerebral infarction 11/19/2013   Past Medical History:  Diagnosis Date   Aneurysm of infrarenal abdominal aorta    CAD (coronary artery disease)    4 stents   Carotid artery disease    Left ICA   Hyperlipidemia    Intracerebral hemorrhage 1960   Peripheral neuropathy     Family History  Problem Relation Age of Onset   Vision loss Mother    Diabetes Mother    Crohn's disease Father     Past Surgical History:  Procedure Laterality Date   APPENDECTOMY  1960   I & D EXTREMITY Right 06/19/2020   Procedure: IRRIGATION AND DEBRIDEMENT LONG FINGER;  Surgeon: Betha Loa, MD;  Location: [facility];  Service: Orthopedics;  Laterality: Right;   LEFT HEART CATH  06/20/2018   ORIF FEMUR FRACTURE  1988   Social History   Occupational History    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

## 2022-06-28 ENCOUNTER — Ambulatory Visit (INDEPENDENT_AMBULATORY_CARE_PROVIDER_SITE_OTHER): Payer: No Typology Code available for payment source | Admitting: Orthopaedic Surgery

## 2022-06-28 ENCOUNTER — Other Ambulatory Visit (INDEPENDENT_AMBULATORY_CARE_PROVIDER_SITE_OTHER): Payer: No Typology Code available for payment source

## 2022-06-28 ENCOUNTER — Encounter: Payer: Self-pay | Admitting: Orthopaedic Surgery

## 2022-06-28 DIAGNOSIS — S72352K Displaced comminuted fracture of shaft of left femur, subsequent encounter for closed fracture with nonunion: Secondary | ICD-10-CM | POA: Diagnosis not present

## 2022-09-22 ENCOUNTER — Ambulatory Visit: Payer: No Typology Code available for payment source | Admitting: Orthopaedic Surgery

## 2022-09-22 ENCOUNTER — Other Ambulatory Visit: Payer: No Typology Code available for payment source

## 2022-09-22 DIAGNOSIS — S72352K Displaced comminuted fracture of shaft of left femur, subsequent encounter for closed fracture with nonunion: Secondary | ICD-10-CM

## 2022-09-22 MED ORDER — LIDOCAINE HCL 1 % IJ SOLN
2.0000 mL | INTRAMUSCULAR | Status: AC | PRN
  Administered 2022-09-22: 2 mL

## 2022-09-22 MED ORDER — BUPIVACAINE HCL 0.5 % IJ SOLN
2.0000 mL | INTRAMUSCULAR | Status: AC | PRN
  Administered 2022-09-22: 2 mL via INTRA_ARTICULAR

## 2022-09-22 MED ORDER — METHYLPREDNISOLONE ACETATE 40 MG/ML IJ SUSP
40.0000 mg | INTRAMUSCULAR | Status: AC | PRN
  Administered 2022-09-22: 40 mg via INTRA_ARTICULAR

## 2022-09-22 NOTE — Progress Notes (Signed)
Office Visit Note   Patient: Jeffrey [name]           Date of Birth: February 21, 1956           MRN: [id number] Visit Date: 09/22/2022              Requested by: Lise Auer, MD [address] [place],  [place] [address] PCP: Lise Auer, MD   Assessment & Plan: Visit Diagnoses:  1. 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  He is doing well and not having problems.  His main issue is the knee pain which I believe is an exacerbation of his underlying osteoarthritis.  I performed an aspiration and cortisone injection today.  12 cc of joint fluid was aspirated.  Patient tolerated procedure well.  From my standpoint he can follow-up with [place] as needed.  Follow-Up Instructions: No follow-ups on file.   Orders:  Orders Placed This Encounter  Procedures   Large Joint Inj: L knee   XR FEMUR MIN 2 VIEWS LEFT   No orders of the defined types were placed in this encounter.     Procedures: Large Joint Inj: L knee on 09/22/2022 10:24 AM Details: 22 G needle Medications: 2 mL bupivacaine 0.5 %; 2 mL lidocaine 1 %; 40 mg methylPREDNISolone acetate 40 MG/ML Outcome: tolerated well, no immediate complications Patient was prepped and draped in the usual sterile fashion.       Clinical Data: No additional findings.   Subjective: Chief Complaint  Patient presents with   Left Leg - Pain    HPI [name] is a 67 year old gentleman who is 9 months status post left femur fracture with IM nailing.  He comes in today for follow-up for this as well as new problem of left knee pain.  Denies any injuries.  Reports some swelling to the knee. Review of Systems   Objective: Vital Signs: There were no vitals taken for this visit.  Physical Exam  Ortho Exam Examination of the left knee shows small joint effusion.  No real joint line tenderness.  Slight pain with range of  motion. Specialty Comments:  No specialty comments available.  Imaging: XR FEMUR MIN 2 VIEWS LEFT  Result Date: 09/22/2022 X-rays demonstrate abundant callus formation consistent with fracture healing.  There is near complete fracture consolidation.    PMFS History: Patient Active Problem List   Diagnosis Date Noted   Atrial fibrillation with rapid ventricular response (HCC) 11/26/2021   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   Occlusion and stenosis of carotid artery without mention of cerebral infarction 11/19/2013   Past Medical History:  Diagnosis Date   Aneurysm of infrarenal abdominal aorta (HCC)    CAD (coronary artery disease)    4 stents   Carotid artery disease (HCC)    Left ICA   Hyperlipidemia    Intracerebral hemorrhage (HCC) 1960   Peripheral neuropathy     Family History  Problem Relation Age of Onset   Vision loss Mother    Diabetes Mother    Crohn's disease Father     Past Surgical History:  Procedure Laterality Date   APPENDECTOMY  1960   I & D EXTREMITY Right 06/19/2020   Procedure: IRRIGATION AND DEBRIDEMENT LONG FINGER;  Surgeon: Betha Loa, MD;  Location[facility];  Service: Orthopedics;  Laterality: Right;   LEFT HEART CATH  06/20/2018  ORIF FEMUR FRACTURE  1988   Social History   Occupational History    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

## 2022-11-10 ENCOUNTER — Telehealth: Payer: Self-pay | Admitting: Orthopaedic Surgery

## 2022-11-10 NOTE — Telephone Encounter (Signed)
Last 2 ov notes faxed to [facility], [place], [place] [phone]

## 2023-01-31 ENCOUNTER — Ambulatory Visit (INDEPENDENT_AMBULATORY_CARE_PROVIDER_SITE_OTHER): Payer: No Typology Code available for payment source | Admitting: Orthopaedic Surgery

## 2023-01-31 ENCOUNTER — Other Ambulatory Visit (INDEPENDENT_AMBULATORY_CARE_PROVIDER_SITE_OTHER): Payer: No Typology Code available for payment source

## 2023-01-31 DIAGNOSIS — M1732 Unilateral post-traumatic osteoarthritis, left knee: Secondary | ICD-10-CM | POA: Diagnosis not present

## 2023-01-31 NOTE — Progress Notes (Signed)
Office Visit Note   Patient: Jeffrey [name]           Date of Birth: [birth year]           MRN: [id number] Visit Date: 01/31/2023              Requested by: Lise Auer, MD [address] [place],  [place] [address] PCP: Lise Auer, MD   Assessment & Plan: Visit Diagnoses:  1. Post-traumatic osteoarthritis of left knee     Plan: Patient is a 67 year old gentleman with posttraumatic arthritis of the left knee.  I reviewed her prior MRI from September last year which shows grade 4 changes.  He has a little bit of hyperextension malunion of the condyles.  He has retained cephalomedullary implant.  Based on these findings I have recommended a left total knee replacement.  Due to the presence of the nail he will need patient-specific blocks for the femur.  We will get necessary clearances from his respective doctors prior to scheduling surgery.  [name] will call the patient to confirm surgery date.  Total face to face encounter time was greater than 25 minutes and over half of this time was spent in counseling and/or coordination of care.  Impression is severe left knee degenerative joint disease secondary to Post-traumatic arthritis.  Bone on bone joint space narrowing is seen on MRI with neutral alignment.  At this point, conservative treatments fail to provide any significant relief and the pain is severely affecting ADLs and quality of life.  Based on treatment options, the patient has elected to move forward with a knee replacement.  We have discussed the surgical risks that include but are not limited to infection, DVT, leg length discrepancy, stiffness, numbness, tingling, incomplete relief of pain.  Recovery and prognosis were also reviewed.    Anticoagulants: Eliquis (apixaban) daily Postop anticoagulation: Eliquis Diabetic: No  Nickel allergy: No Prior DVT/PE: No Tobacco use: No Clearances needed for surgery: Allena Katz PCP, [facility]; [facility] Anticipated discharge  dispo: Home   Follow-Up Instructions: No follow-ups on file.   Orders:  Orders Placed This Encounter  Procedures   XR KNEE 3 VIEW LEFT   No orders of the defined types were placed in this encounter.     Procedures: No procedures performed   Clinical Data: No additional findings.   Subjective: Chief Complaint  Patient presents with   Left Knee - Pain    HPI Patient is 67 year old gentleman comes in for evaluation of chronic left knee pain.  Referral from the [facility] for surgical consultation for total knee replacement.  Patient is status post IM nailing of left femur fracture.  He also had a prior supracondylar femur fracture that healed and extension.  He has developed posttraumatic arthritis from this.  We saw him back in July and did a cortisone injection which helped only a little bit.  He continues to have problems with ambulation and has a sensation of hyperextension.  He is having a lot of trouble with ADLs.  He he has a form that he takes care of.  Review of Systems  Constitutional: Negative.   HENT: Negative.    Eyes: Negative.   Respiratory: Negative.    Cardiovascular: Negative.   Gastrointestinal: Negative.   Endocrine: Negative.   Genitourinary: Negative.   Skin: Negative.   Allergic/Immunologic: Negative.   Neurological: Negative.   Hematological: Negative.   Psychiatric/Behavioral: Negative.    All other systems reviewed and are negative.  Objective: Vital Signs: There were no vitals taken for this visit.  Physical Exam Vitals and nursing note reviewed.  Constitutional:      Appearance: He is well-developed.  Pulmonary:     Effort: Pulmonary effort is normal.  Abdominal:     Palpations: Abdomen is soft.  Skin:    General: Skin is warm.  Neurological:     Mental Status: He is alert and oriented to person, place, and time.  Psychiatric:        Behavior: Behavior normal.        Thought Content: Thought content normal.        Judgment: Judgment  normal.     Ortho Exam Exam of the left knee shows small joint effusion.  Range of motion is approximately 0 215 degrees.  Collaterals and cruciates are stable.  Fully healed surgical scars. Specialty Comments:  No specialty comments available.  Imaging: No results found.   PMFS History: Patient Active Problem List   Diagnosis Date Noted   Post-traumatic osteoarthritis of left knee 01/31/2023   Atrial fibrillation with rapid ventricular response (HCC) 11/26/2021   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   Occlusion and stenosis of carotid artery without mention of cerebral infarction 11/19/2013   Past Medical History:  Diagnosis Date   Aneurysm of infrarenal abdominal aorta (HCC)    CAD (coronary artery disease)    4 stents   Carotid artery disease (HCC)    Left ICA   Hyperlipidemia    Intracerebral hemorrhage (HCC) 1960   Peripheral neuropathy     Family History  Problem Relation Age of Onset   Vision loss Mother    Diabetes Mother    Crohn's disease Father     Past Surgical History:  Procedure Laterality Date   APPENDECTOMY  1960   I & D EXTREMITY Right 06/19/2020   Procedure: IRRIGATION AND DEBRIDEMENT LONG FINGER;  Surgeon: Betha Loa, MD;  Location: [facility] OR;  Service: Orthopedics;  Laterality: Right;   LEFT HEART CATH  06/20/2018   ORIF FEMUR FRACTURE  1988   Social History   Occupational History    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

## 2023-03-15 ENCOUNTER — Other Ambulatory Visit (HOSPITAL_COMMUNITY): Payer: No Typology Code available for payment source

## 2023-03-20 ENCOUNTER — Ambulatory Visit (HOSPITAL_COMMUNITY): Admit: 2023-03-20 | Payer: No Typology Code available for payment source | Admitting: Orthopaedic Surgery

## 2023-03-20 SURGERY — TOTAL KNEE ARTHROPLASTY
Anesthesia: Spinal | Site: Knee | Laterality: Left

## 2023-03-21 ENCOUNTER — Other Ambulatory Visit: Payer: Self-pay

## 2023-03-21 DIAGNOSIS — M1732 Unilateral post-traumatic osteoarthritis, left knee: Secondary | ICD-10-CM

## 2023-03-23 ENCOUNTER — Ambulatory Visit
Admission: RE | Admit: 2023-03-23 | Discharge: 2023-03-23 | Disposition: A | Discharge status: Home / Self Care | Payer: No Typology Code available for payment source | Source: Ambulatory Visit | Attending: Orthopaedic Surgery | Admitting: Orthopaedic Surgery

## 2023-03-23 DIAGNOSIS — M1732 Unilateral post-traumatic osteoarthritis, left knee: Secondary | ICD-10-CM

## 2023-04-06 ENCOUNTER — Telehealth: Payer: Self-pay | Admitting: Orthopaedic Surgery

## 2023-04-06 ENCOUNTER — Other Ambulatory Visit: Payer: Self-pay

## 2023-04-06 DIAGNOSIS — M1732 Unilateral post-traumatic osteoarthritis, left knee: Secondary | ICD-10-CM

## 2023-04-06 NOTE — Telephone Encounter (Signed)
Called patient and left message on voicemail asking him to call regarding the scan needed for the patient specific implant.  Provided my name and direct number.

## 2023-04-10 ENCOUNTER — Ambulatory Visit
Admission: RE | Admit: 2023-04-10 | Discharge: 2023-04-10 | Disposition: A | Discharge status: Home / Self Care | Payer: No Typology Code available for payment source | Source: Ambulatory Visit | Attending: Orthopaedic Surgery | Admitting: Orthopaedic Surgery

## 2023-04-10 DIAGNOSIS — M1732 Unilateral post-traumatic osteoarthritis, left knee: Secondary | ICD-10-CM

## 2023-05-03 ENCOUNTER — Other Ambulatory Visit: Payer: Self-pay | Admitting: Physician Assistant

## 2023-05-03 MED ORDER — ONDANSETRON HCL 4 MG PO TABS: 4.0000 mg | ORAL_TABLET | Freq: Three times a day (TID) | ORAL | 0 refills | Status: AC | PRN

## 2023-05-03 MED ORDER — DOCUSATE SODIUM 100 MG PO CAPS: 100.0000 mg | ORAL_CAPSULE | Freq: Every day | ORAL | 2 refills | Status: AC | PRN

## 2023-05-03 MED ORDER — OXYCODONE-ACETAMINOPHEN 5-325 MG PO TABS: 1.0000 | ORAL_TABLET | Freq: Four times a day (QID) | ORAL | 0 refills | Status: DC | PRN

## 2023-05-03 MED ORDER — METHOCARBAMOL 750 MG PO TABS: 750.0000 mg | ORAL_TABLET | Freq: Three times a day (TID) | ORAL | 2 refills | Status: DC | PRN

## 2023-05-04 ENCOUNTER — Other Ambulatory Visit: Payer: Self-pay | Admitting: Physician Assistant

## 2023-05-04 ENCOUNTER — Encounter (HOSPITAL_COMMUNITY): Payer: Self-pay

## 2023-05-04 NOTE — Pre-Procedure Instructions (Signed)
 Surgical Instructions   Your procedure is scheduled on May 12, 2023. Report to [facility] Main Entrance "A" at 5:30 A.M., then check in with the Admitting office. Any questions or running late day of surgery: call [phone]  Questions prior to your surgery date: call [phone], Monday-Friday, 8am-4pm. If you experience any cold or flu symptoms such as cough, fever, chills, shortness of breath, etc. between now and your scheduled surgery, please notify us at the above number.     Remember:  Do not eat after midnight the night before your surgery  You may drink clear liquids until 4:30 AM the morning of your surgery.   Clear liquids allowed are: Water, Non-Citrus Juices (without pulp), Carbonated Beverages, Clear Tea (no milk, honey, etc.), Black Coffee Only (NO MILK, CREAM OR POWDERED CREAMER of any kind), and Gatorade.  Patient Instructions  The night before surgery:  No food after midnight. ONLY clear liquids after midnight  The day of surgery (if you have diabetes): Drink ONE (1) 12 oz G2 given to you in your pre admission testing appointment by 4:30 AM the morning of surgery. Drink in one sitting. Do not sip.  This drink was given to you during your hospital  pre-op appointment visit.  Nothing else to drink after completing the  12 oz bottle of G2.         If you have questions, please contact your surgeon's office.    Take these medicines the morning of surgery with A SIP OF WATER: allopurinol (ZYLOPRIM)  citalopram (CELEXA)  famotidine (PEPCID)  gabapentin (NEURONTIN)  hydrOXYzine (VISTARIL)  metoprolol tartrate (LOPRESSOR)  ranolazine (RANEXA)    May take these medicines IF NEEDED: acetaminophen (TYLENOL)  colchicine  nitroGLYCERIN (NITROSTAT) - if dose taken prior to surgery, please call either of the above phone numbers   Follow your surgeon's instructions on when to stop apixaban (ELIQUIS).  If no instructions were given by your surgeon then you will  need to call the office to get those instructions.     One week prior to surgery, STOP taking any Aspirin (unless otherwise instructed by your surgeon) Aleve, Naproxen, Ibuprofen, Motrin, Advil, Goody's, BC's, all herbal medications, fish oil, and non-prescription vitamins.   WHAT DO I DO ABOUT MY DIABETES MEDICATION?      STOP taking your empagliflozin (JARDIANCE) three days prior to surgery. Your last dose will be March 3rd.   HOW TO MANAGE YOUR DIABETES BEFORE AND AFTER SURGERY  Why is it important to control my blood sugar before and after surgery? Improving blood sugar levels before and after surgery helps healing and can limit problems. A way of improving blood sugar control is eating a healthy diet by:  Eating less sugar and carbohydrates  Increasing activity/exercise  Talking with your doctor about reaching your blood sugar goals High blood sugars (greater than 180 mg/dL) can raise your risk of infections and slow your recovery, so you will need to focus on controlling your diabetes during the weeks before surgery. Make sure that the doctor who takes care of your diabetes knows about your planned surgery including the date and location.  How do I manage my blood sugar before surgery? Check your blood sugar at least 4 times a day, starting 2 days before surgery, to make sure that the level is not too high or low.  Check your blood sugar the morning of your surgery when you wake up and every 2 hours until you get to the Short Stay unit.  If your blood sugar is less than 70 mg/dL, you will need to treat for low blood sugar: Do not take insulin. Treat a low blood sugar (less than 70 mg/dL) with  cup of clear juice (cranberry or apple), 4 glucose tablets, OR glucose gel. Recheck blood sugar in 15 minutes after treatment (to make sure it is greater than 70 mg/dL). If your blood sugar is not greater than 70 mg/dL on recheck, call [phone] for further instructions. Report your  blood sugar to the short stay nurse when you get to Short Stay.  If you are admitted to the hospital after surgery: Your blood sugar will be checked by the staff and you will probably be given insulin after surgery (instead of oral diabetes medicines) to make sure you have good blood sugar levels. The goal for blood sugar control after surgery is 80-180 mg/dL.             Do NOT Smoke (Tobacco/Vaping) for 24 hours prior to your procedure.  If you use a CPAP at night, you may bring your mask/headgear for your overnight stay.   You will be asked to remove any contacts, glasses, piercing's, hearing aid's, dentures/partials prior to surgery. Please bring cases for these items if needed.    Patients discharged the day of surgery will not be allowed to drive home, and someone needs to stay with them for 24 hours.  SURGICAL WAITING ROOM VISITATION Patients may have no more than 2 support people in the waiting area - these visitors may rotate.   Pre-op nurse will coordinate an appropriate time for 1 ADULT support person, who may not rotate, to accompany patient in pre-op.  Children under the age of 47 must have an adult with them who is not the patient and must remain in the main waiting area with an adult.  If the patient needs to stay at the hospital during part of their recovery, the visitor guidelines for inpatient rooms apply.  Please refer to the [facility] website for the visitor guidelines for any additional information.   If you received a COVID test during your pre-op visit  it is requested that you wear a mask when out in public, stay away from anyone that may not be feeling well and notify your surgeon if you develop symptoms. If you have been in contact with anyone that has tested positive in the last 10 days please notify you surgeon.      Pre-operative 5 CHG Bathing Instructions   You can play a key role in reducing the risk of infection after surgery. Your skin needs to be as  free of germs as possible. You can reduce the number of germs on your skin by washing with CHG (chlorhexidine gluconate) soap before surgery. CHG is an antiseptic soap that kills germs and continues to kill germs even after washing.   DO NOT use if you have an allergy to chlorhexidine/CHG or antibacterial soaps. If your skin becomes reddened or irritated, stop using the CHG and notify one of our RNs at [phone].   Please shower with the CHG soap starting 4 days before surgery using the following schedule:     Please keep in mind the following:  DO NOT shave, including legs and underarms, starting the day of your first shower.   You may shave your face at any point before/day of surgery.  Place clean sheets on your bed the day you start using CHG soap. Use a clean washcloth (not used  since being washed) for each shower. DO NOT sleep with pets once you start using the CHG.   CHG Shower Instructions:  Wash your face and private area with normal soap. If you choose to wash your hair, wash first with your normal shampoo.  After you use shampoo/soap, rinse your hair and body thoroughly to remove shampoo/soap residue.  Turn the water OFF and apply about 3 tablespoons (45 ml) of CHG soap to a CLEAN washcloth.  Apply CHG soap ONLY FROM YOUR NECK DOWN TO YOUR TOES (washing for 3-5 minutes)  DO NOT use CHG soap on face, private areas, open wounds, or sores.  Pay special attention to the area where your surgery is being performed.  If you are having back surgery, having someone wash your back for you may be helpful. Wait 2 minutes after CHG soap is applied, then you may rinse off the CHG soap.  Pat dry with a clean towel  Put on clean clothes/pajamas   If you choose to wear lotion, please use ONLY the CHG-compatible lotions that are listed below.  Additional instructions for the day of surgery: DO NOT APPLY any lotions, deodorants, cologne, or perfumes.   Do not bring valuables to the hospital.  [facility] is not responsible for any belongings/valuables. Do not wear nail polish, gel polish, artificial nails, or any other type of covering on natural nails (fingers and toes) Do not wear jewelry or makeup Put on clean/comfortable clothes.  Please brush your teeth.  Ask your nurse before applying any prescription medications to the skin.     CHG Compatible Lotions   Aveeno Moisturizing lotion  Cetaphil Moisturizing Cream  Cetaphil Moisturizing Lotion  Clairol Herbal Essence Moisturizing Lotion, Dry Skin  Clairol Herbal Essence Moisturizing Lotion, Extra Dry Skin  Clairol Herbal Essence Moisturizing Lotion, Normal Skin  Curel Age Defying Therapeutic Moisturizing Lotion with Alpha Hydroxy  Curel Extreme Care Body Lotion  Curel Soothing Hands Moisturizing Hand Lotion  Curel Therapeutic Moisturizing Cream, Fragrance-Free  Curel Therapeutic Moisturizing Lotion, Fragrance-Free  Curel Therapeutic Moisturizing Lotion, Original Formula  Eucerin Daily Replenishing Lotion  Eucerin Dry Skin Therapy Plus Alpha Hydroxy Crme  Eucerin Dry Skin Therapy Plus Alpha Hydroxy Lotion  Eucerin Original Crme  Eucerin Original Lotion  Eucerin Plus Crme Eucerin Plus Lotion  Eucerin TriLipid Replenishing Lotion  Keri Anti-Bacterial Hand Lotion  Keri Deep Conditioning Original Lotion Dry Skin Formula Softly Scented  Keri Deep Conditioning Original Lotion, Fragrance Free Sensitive Skin Formula  Keri Lotion Fast Absorbing Fragrance Free Sensitive Skin Formula  Keri Lotion Fast Absorbing Softly Scented Dry Skin Formula  Keri Original Lotion  Keri Skin Renewal Lotion Keri Silky Smooth Lotion  Keri Silky Smooth Sensitive Skin Lotion  Nivea Body Creamy Conditioning Oil  Nivea Body Extra Enriched Lotion  Nivea Body Original Lotion  Nivea Body Sheer Moisturizing Lotion Nivea Crme  Nivea Skin Firming Lotion  NutraDerm 30 Skin Lotion  NutraDerm Skin Lotion  NutraDerm Therapeutic Skin Cream   NutraDerm Therapeutic Skin Lotion  ProShield Protective Hand Cream  Provon moisturizing lotion  Please read over the following fact sheets that you were given.

## 2023-05-05 ENCOUNTER — Encounter (HOSPITAL_COMMUNITY)
Admission: RE | Admit: 2023-05-05 | Discharge: 2023-05-05 | Disposition: A | Discharge status: Home / Self Care | Payer: No Typology Code available for payment source | Source: Ambulatory Visit | Attending: Orthopaedic Surgery | Admitting: Orthopaedic Surgery

## 2023-05-05 ENCOUNTER — Other Ambulatory Visit: Payer: Self-pay

## 2023-05-05 ENCOUNTER — Encounter (HOSPITAL_COMMUNITY): Payer: Self-pay

## 2023-05-05 VITALS — BP 149/57 | HR 53 | Temp 98.0°F | Resp 17 | Ht 73.0 in | Wt 227.8 lb

## 2023-05-05 DIAGNOSIS — Z79899 Other long term (current) drug therapy: Secondary | ICD-10-CM | POA: Diagnosis not present

## 2023-05-05 DIAGNOSIS — Z01818 Encounter for other preprocedural examination: Secondary | ICD-10-CM | POA: Diagnosis present

## 2023-05-05 DIAGNOSIS — E785 Hyperlipidemia, unspecified: Secondary | ICD-10-CM | POA: Diagnosis not present

## 2023-05-05 DIAGNOSIS — I4891 Unspecified atrial fibrillation: Secondary | ICD-10-CM | POA: Diagnosis not present

## 2023-05-05 DIAGNOSIS — R001 Bradycardia, unspecified: Secondary | ICD-10-CM | POA: Insufficient documentation

## 2023-05-05 DIAGNOSIS — I2510 Atherosclerotic heart disease of native coronary artery without angina pectoris: Secondary | ICD-10-CM | POA: Insufficient documentation

## 2023-05-05 DIAGNOSIS — Z87891 Personal history of nicotine dependence: Secondary | ICD-10-CM | POA: Insufficient documentation

## 2023-05-05 DIAGNOSIS — Z7901 Long term (current) use of anticoagulants: Secondary | ICD-10-CM | POA: Diagnosis not present

## 2023-05-05 DIAGNOSIS — M1732 Unilateral post-traumatic osteoarthritis, left knee: Secondary | ICD-10-CM | POA: Diagnosis not present

## 2023-05-05 DIAGNOSIS — E1142 Type 2 diabetes mellitus with diabetic polyneuropathy: Secondary | ICD-10-CM | POA: Diagnosis not present

## 2023-05-05 DIAGNOSIS — E1151 Type 2 diabetes mellitus with diabetic peripheral angiopathy without gangrene: Secondary | ICD-10-CM | POA: Insufficient documentation

## 2023-05-05 DIAGNOSIS — Z955 Presence of coronary angioplasty implant and graft: Secondary | ICD-10-CM | POA: Insufficient documentation

## 2023-05-05 DIAGNOSIS — I10 Essential (primary) hypertension: Secondary | ICD-10-CM | POA: Diagnosis not present

## 2023-05-05 DIAGNOSIS — E119 Type 2 diabetes mellitus without complications: Secondary | ICD-10-CM

## 2023-05-05 DIAGNOSIS — K219 Gastro-esophageal reflux disease without esophagitis: Secondary | ICD-10-CM | POA: Insufficient documentation

## 2023-05-05 HISTORY — DX: Unilateral post-traumatic osteoarthritis, left knee: M17.32

## 2023-05-05 HISTORY — DX: Type 2 diabetes mellitus without complications: E11.9

## 2023-05-05 HISTORY — DX: Cardiac arrhythmia, unspecified: I49.9

## 2023-05-05 LAB — CBC
HCT: 39.2 % (ref 39.0–52.0)
Hemoglobin: 13.7 g/dL (ref 13.0–17.0)
MCH: 32.4 pg (ref 26.0–34.0)
MCHC: 34.9 g/dL (ref 30.0–36.0)
MCV: 92.7 fL (ref 80.0–100.0)
Platelets: 181 10*3/uL (ref 150–400)
RBC: 4.23 MIL/uL (ref 4.22–5.81)
RDW: 13.6 % (ref 11.5–15.5)
WBC: 7.9 10*3/uL (ref 4.0–10.5)
nRBC: 0 % (ref 0.0–0.2)

## 2023-05-05 LAB — BASIC METABOLIC PANEL
Anion gap: 9 (ref 5–15)
BUN: 13 mg/dL (ref 8–23)
CO2: 26 mmol/L (ref 22–32)
Calcium: 9.1 mg/dL (ref 8.9–10.3)
Chloride: 103 mmol/L (ref 98–111)
Creatinine, Ser: 0.98 mg/dL (ref 0.61–1.24)
GFR, Estimated: >60 mL/min (ref >60)
Glucose, Bld: 168 mg/dL — ABNORMAL HIGH (ref 70–99)
Potassium: 3.7 mmol/L (ref 3.5–5.1)
Sodium: 138 mmol/L (ref 135–145)

## 2023-05-05 LAB — GLUCOSE, CAPILLARY: Glucose-Capillary: 168 mg/dL — ABNORMAL HIGH (ref 70–99)

## 2023-05-05 LAB — SURGICAL PCR SCREEN
MRSA, PCR: NEGATIVE
Staphylococcus aureus: NEGATIVE

## 2023-05-05 NOTE — Progress Notes (Signed)
 PCP - Dr. Morley Kos- [facility] Cardiologist - Dorethea Clan PA EP- Rick Duff PA  PPM/ICD - denies   Chest x-ray - 11/26/21 EKG - 05/05/23 Stress Test - 06/18/18-[facility] ECHO - 11/27/21 Cardiac Cath - 06/20/18- [facility]  Sleep Study - denies   DM- pt does not check CBG at home and does not know typical fasting levels  Last dose of GLP1 agonist-  n/a   Blood Thinner Instructions: Hold Eliquis 3 days. Last dose 3/3 Aspirin Instructions: n/a  ERAS Protcol - yes clears until 0430 PRE-SURGERY G2- given at PAT  COVID TEST- n/a   Anesthesia review: yes, cardiac hx  Patient denies shortness of breath, fever, cough and chest pain at PAT appointment   All instructions explained to the patient, with a verbal understanding of the material. Patient agrees to go over the instructions while at home for a better understanding. The opportunity to ask questions was provided.

## 2023-05-08 NOTE — Anesthesia Preprocedure Evaluation (Addendum)
 Anesthesia Evaluation  Patient identified by MRN, date of birth, ID band Patient awake    Reviewed: Allergy & Precautions, H&P , NPO status , Patient's Chart, lab work & pertinent test results  Airway Mallampati: III  TM Distance: >3 FB Neck ROM: Full    Dental no notable dental hx. (+) Dental Advisory Given, Edentulous Upper, Missing,    Pulmonary former smoker   Pulmonary exam normal breath sounds clear to auscultation       Cardiovascular Pt. on home beta blockers + CAD and + Cardiac Stents  + dysrhythmias  Rhythm:Regular Rate:Normal     Neuro/Psych  Neuromuscular disease negative neurological ROS  negative psych ROS   GI/Hepatic negative GI ROS, Neg liver ROS,,,  Endo/Other  diabetes    Renal/GU negative Renal ROS  negative genitourinary   Musculoskeletal   Abdominal   Peds  Hematology negative hematology ROS (+)   Anesthesia Other Findings   Reproductive/Obstetrics negative OB ROS                             Anesthesia Physical Anesthesia Plan  ASA: 3  Anesthesia Plan: MAC, Regional and Spinal   Post-op Pain Management: Regional block* and Tylenol PO (pre-op)*   Induction: Intravenous  PONV Risk Score and Plan: 3 and Ondansetron, Treatment may vary due to age or medical condition, Propofol infusion and Midazolam  Airway Management Planned: Natural Airway and Simple Face Mask  Additional Equipment: None  Intra-op Plan:   Post-operative Plan: Extubation in OR  Informed Consent: I have reviewed the patients History and Physical, chart, labs and discussed the procedure including the risks, benefits and alternatives for the proposed anesthesia with the patient or authorized representative who has indicated his/her understanding and acceptance.     Dental advisory given  Plan Discussed with: CRNA and Anesthesiologist  Anesthesia Plan Comments: (PAT note by Antionette Poles,  PA-C: 68 year old male follows with cardiology at the [facility] for history of A-fib s/p cardioversion, CAD s/p DES to distal RCA and LCx in 06/2018, known left carotid occlusion, HLD, HTN, lower extremity edema.  Echo 11/2021 showed EF 60 to 65%, normal wall motion, normal RV function, no significant valvular abnormalities.  Clearance per telephone encounter by Janora Norlander, PA-C on 02/23/2023 states, "spoke with Mr Fulco by phone 02/23/2023. Mr Sexson reports he is feeling well  and denies any cardiac complaints or concerns. No CP or SOB. No near syncope or syncope. Mr Knappenberger is considered optimized from a cardiovascular standpoint at this time. Metoprolol should not be held in the perioperative period.  Eliquis (apixaban) may be held 2-3 days prior to surgery if needed.  Eliquis (apixaban) should be restarted as soon as possible after surgery, once deemed stable/safe to do so by the surgeon or surgical provider."  Other pertinent history includes GERD (on famotidine), non-insulin-dependent DM2 (A1c 6.7 on 03/16/2023, peripheral polyneuropathy, depression, PAD, former smoker (quit ~2020).  Patient reports last dose Eliquis 05/08/2023.  Preop labs reviewed, unremarkable.  EKG 04/15/2023: Sinus bradycardia.  Rate 51.  Carotid duplex 12/07/22 (Care Everywhere): Impression: 1. No evidence of hemodynamically significant right ICA stenosis  by velocity or ratio criteria.   2. Chronic left ICA occlusion.   TTE 11/27/2021: 1. Left ventricular ejection fraction, by estimation, is 60 to 65%. The  left ventricle has normal function. The left ventricle has no regional  wall motion abnormalities. Left ventricular diastolic parameters are  indeterminate.  2. Right ventricular systolic  function is normal. The right ventricular  size is mildly enlarged.  3. Left atrial size was mildly dilated.  4. Right atrial size was mildly dilated.  5. The mitral valve is abnormal. No evidence of mitral valve  regurgitation.  No evidence of mitral stenosis.  6. The aortic valve is normal in structure. Aortic valve regurgitation is  not visualized. No aortic stenosis is present.  7. The inferior vena cava is normal in size with <50% respiratory  variability, suggesting right atrial pressure of 8 mmHg.   )        Anesthesia Quick Evaluation

## 2023-05-08 NOTE — Progress Notes (Signed)
 Anesthesia Chart Review:  67 year old male follows with cardiology at the [facility] for history of A-fib s/p cardioversion, CAD s/p DES to distal RCA and LCx in 06/2018, known left carotid occlusion, HLD, HTN, lower extremity edema.  Echo 11/2021 showed EF 60 to 65%, normal wall motion, normal RV function, no significant valvular abnormalities.  Clearance per telephone encounter by Janora Norlander, PA-C on 02/23/2023 states, "spoke with Mr Carton by phone 02/23/2023. Mr Kaeding reports he is feeling well  and denies any cardiac complaints or concerns. No CP or SOB. No near syncope or syncope. Mr Cornette is considered optimized from a cardiovascular standpoint at this time. Metoprolol should not be held in the perioperative period.  Eliquis (apixaban) may be held 2-3 days prior to surgery if needed.  Eliquis (apixaban) should be restarted as soon as possible after surgery, once deemed stable/safe to do so by the surgeon or surgical provider."  Other pertinent history includes GERD (on famotidine), non-insulin-dependent DM2 (A1c 6.7 on 03/16/2023, peripheral polyneuropathy, depression, PAD, former smoker (quit ~2020).  Patient reports last dose Eliquis 05/08/2023.  Preop labs reviewed, unremarkable.  EKG 04/15/2023: Sinus bradycardia.  Rate 51.  Carotid duplex 12/07/22 (Care Everywhere): Impression: 1. No evidence of hemodynamically significant right ICA stenosis  by velocity or ratio criteria.   2. Chronic left ICA occlusion.   TTE 11/27/2021: 1. Left ventricular ejection fraction, by estimation, is 60 to 65%. The  left ventricle has normal function. The left ventricle has no regional  wall motion abnormalities. Left ventricular diastolic parameters are  indeterminate.   2. Right ventricular systolic function is normal. The right ventricular  size is mildly enlarged.   3. Left atrial size was mildly dilated.   4. Right atrial size was mildly dilated.   5. The mitral valve is abnormal. No evidence of mitral  valve  regurgitation. No evidence of mitral stenosis.   6. The aortic valve is normal in structure. Aortic valve regurgitation is  not visualized. No aortic stenosis is present.   7. The inferior vena cava is normal in size with <50% respiratory  variability, suggesting right atrial pressure of 8 mmHg.      Zannie Cove [facility] Short Stay Center/Anesthesiology Phone [phone] 05/08/2023 3:58 PM

## 2023-05-11 MED ORDER — TRANEXAMIC ACID 1000 MG/10ML IV SOLN
2000.0000 mg | INTRAVENOUS | Status: DC
  Filled 2023-05-11: qty 20

## 2023-05-12 ENCOUNTER — Observation Stay (HOSPITAL_COMMUNITY)
Admission: RE | Admit: 2023-05-12 | Discharge: 2023-05-13 | Disposition: A | Discharge status: Home / Self Care | Payer: No Typology Code available for payment source | Attending: Orthopaedic Surgery | Admitting: Orthopaedic Surgery

## 2023-05-12 ENCOUNTER — Observation Stay (HOSPITAL_COMMUNITY)

## 2023-05-12 ENCOUNTER — Ambulatory Visit (HOSPITAL_COMMUNITY): Payer: Self-pay | Admitting: Physician Assistant

## 2023-05-12 ENCOUNTER — Other Ambulatory Visit: Payer: Self-pay

## 2023-05-12 ENCOUNTER — Encounter (HOSPITAL_COMMUNITY)
Admission: RE | Disposition: A | Discharge status: Home / Self Care | Payer: Self-pay | Source: Home / Self Care | Attending: Orthopaedic Surgery

## 2023-05-12 ENCOUNTER — Encounter (HOSPITAL_COMMUNITY): Payer: Self-pay | Admitting: Orthopaedic Surgery

## 2023-05-12 ENCOUNTER — Ambulatory Visit (HOSPITAL_COMMUNITY): Payer: Self-pay | Admitting: Anesthesiology

## 2023-05-12 DIAGNOSIS — Z87891 Personal history of nicotine dependence: Secondary | ICD-10-CM

## 2023-05-12 DIAGNOSIS — Z7901 Long term (current) use of anticoagulants: Secondary | ICD-10-CM | POA: Diagnosis not present

## 2023-05-12 DIAGNOSIS — Z79899 Other long term (current) drug therapy: Secondary | ICD-10-CM | POA: Insufficient documentation

## 2023-05-12 DIAGNOSIS — I2510 Atherosclerotic heart disease of native coronary artery without angina pectoris: Secondary | ICD-10-CM

## 2023-05-12 DIAGNOSIS — Z96652 Presence of left artificial knee joint: Secondary | ICD-10-CM

## 2023-05-12 DIAGNOSIS — E119 Type 2 diabetes mellitus without complications: Secondary | ICD-10-CM | POA: Insufficient documentation

## 2023-05-12 DIAGNOSIS — M1712 Unilateral primary osteoarthritis, left knee: Principal | ICD-10-CM | POA: Insufficient documentation

## 2023-05-12 DIAGNOSIS — M1732 Unilateral post-traumatic osteoarthritis, left knee: Principal | ICD-10-CM | POA: Diagnosis present

## 2023-05-12 DIAGNOSIS — I4891 Unspecified atrial fibrillation: Secondary | ICD-10-CM | POA: Diagnosis not present

## 2023-05-12 HISTORY — PX: TOTAL KNEE ARTHROPLASTY: SHX125

## 2023-05-12 LAB — GLUCOSE, CAPILLARY
Glucose-Capillary: 145 mg/dL — ABNORMAL HIGH (ref 70–99)
Glucose-Capillary: 182 mg/dL — ABNORMAL HIGH (ref 70–99)
Glucose-Capillary: 200 mg/dL — ABNORMAL HIGH (ref 70–99)
Glucose-Capillary: 358 mg/dL — ABNORMAL HIGH (ref 70–99)
Glucose-Capillary: 305 mg/dL — ABNORMAL HIGH (ref 70–99)

## 2023-05-12 LAB — HEMOGLOBIN A1C
Hgb A1c MFr Bld: 6.1 % — ABNORMAL HIGH (ref 4.8–5.6)
Mean Plasma Glucose: 128.37 mg/dL

## 2023-05-12 SURGERY — ARTHROPLASTY, KNEE, TOTAL
Anesthesia: Monitor Anesthesia Care | Site: Knee | Laterality: Left

## 2023-05-12 MED ORDER — INSULIN ASPART 100 UNIT/ML IJ SOLN
0.0000 [IU] | Freq: Every day | INTRAMUSCULAR | Status: DC
  Administered 2023-05-12: 4 [IU] via SUBCUTANEOUS

## 2023-05-12 MED ORDER — LIDOCAINE 2% (20 MG/ML) 5 ML SYRINGE
INTRAMUSCULAR | Status: AC
  Filled 2023-05-12: qty 5

## 2023-05-12 MED ORDER — DOCUSATE SODIUM 100 MG PO CAPS
100.0000 mg | ORAL_CAPSULE | Freq: Two times a day (BID) | ORAL | Status: DC
  Administered 2023-05-12 – 2023-05-13 (×3): 100 mg via ORAL
  Filled 2023-05-12 (×3): qty 1

## 2023-05-12 MED ORDER — ONDANSETRON HCL 4 MG/2ML IJ SOLN
INTRAMUSCULAR | Status: AC
  Filled 2023-05-12: qty 2

## 2023-05-12 MED ORDER — HYDROMORPHONE HCL 1 MG/ML IJ SOLN
0.5000 mg | INTRAMUSCULAR | Status: DC | PRN
  Filled 2023-05-12: qty 1

## 2023-05-12 MED ORDER — INSULIN ASPART 100 UNIT/ML IJ SOLN
0.0000 [IU] | Freq: Three times a day (TID) | INTRAMUSCULAR | Status: DC
  Administered 2023-05-12: 15 [IU] via SUBCUTANEOUS
  Administered 2023-05-13: 5 [IU] via SUBCUTANEOUS

## 2023-05-12 MED ORDER — GABAPENTIN 300 MG PO CAPS
300.0000 mg | ORAL_CAPSULE | Freq: Every day | ORAL | Status: DC
  Administered 2023-05-13: 300 mg via ORAL
  Filled 2023-05-12: qty 1

## 2023-05-12 MED ORDER — INSULIN ASPART 100 UNIT/ML IJ SOLN
0.0000 [IU] | INTRAMUSCULAR | Status: DC | PRN
  Administered 2023-05-12: 2 [IU] via SUBCUTANEOUS
  Filled 2023-05-12: qty 1

## 2023-05-12 MED ORDER — SUGAMMADEX SODIUM 200 MG/2ML IV SOLN
INTRAVENOUS | Status: AC
  Filled 2023-05-12: qty 2

## 2023-05-12 MED ORDER — CELECOXIB 200 MG PO CAPS: 200.0000 mg | ORAL_CAPSULE | Freq: Once | ORAL | Status: AC

## 2023-05-12 MED ORDER — OXYCODONE HCL 5 MG PO TABS
5.0000 mg | ORAL_TABLET | ORAL | Status: DC | PRN
  Filled 2023-05-12: qty 2

## 2023-05-12 MED ORDER — METOCLOPRAMIDE HCL 5 MG/ML IJ SOLN: 5.0000 mg | Freq: Three times a day (TID) | INTRAMUSCULAR | Status: DC | PRN

## 2023-05-12 MED ORDER — FENTANYL CITRATE (PF) 100 MCG/2ML IJ SOLN
25.0000 ug | INTRAMUSCULAR | Status: DC | PRN
Start: 2023-05-12 — End: 2023-05-12

## 2023-05-12 MED ORDER — OXYCODONE HCL ER 10 MG PO T12A
10.0000 mg | EXTENDED_RELEASE_TABLET | Freq: Two times a day (BID) | ORAL | Status: DC
  Administered 2023-05-12 – 2023-05-13 (×3): 10 mg via ORAL
  Filled 2023-05-12 (×3): qty 1

## 2023-05-12 MED ORDER — MIDAZOLAM HCL 2 MG/2ML IJ SOLN
INTRAMUSCULAR | Status: DC | PRN
  Administered 2023-05-12: 2 mg via INTRAVENOUS

## 2023-05-12 MED ORDER — APIXABAN 2.5 MG PO TABS
2.5000 mg | ORAL_TABLET | Freq: Two times a day (BID) | ORAL | Status: DC
  Administered 2023-05-13: 2.5 mg via ORAL
  Filled 2023-05-12: qty 1

## 2023-05-12 MED ORDER — GABAPENTIN 300 MG PO CAPS
600.0000 mg | ORAL_CAPSULE | Freq: Every day | ORAL | Status: DC
  Administered 2023-05-12: 600 mg via ORAL
  Filled 2023-05-12: qty 2

## 2023-05-12 MED ORDER — POVIDONE-IODINE 10 % EX SWAB: 2.0000 | Freq: Once | CUTANEOUS | Status: DC

## 2023-05-12 MED ORDER — ONDANSETRON HCL 4 MG/2ML IJ SOLN: 4.0000 mg | Freq: Once | INTRAMUSCULAR | Status: DC | PRN

## 2023-05-12 MED ORDER — PHENOL 1.4 % MT LIQD: 1.0000 | OROMUCOSAL | Status: DC | PRN

## 2023-05-12 MED ORDER — CEFAZOLIN SODIUM-DEXTROSE 2-4 GM/100ML-% IV SOLN
2.0000 g | Freq: Four times a day (QID) | INTRAVENOUS | Status: AC
  Administered 2023-05-12 (×2): 2 g via INTRAVENOUS
  Filled 2023-05-12 (×2): qty 100

## 2023-05-12 MED ORDER — PRONTOSAN WOUND IRRIGATION OPTIME
TOPICAL | Status: DC | PRN
  Administered 2023-05-12: 350 mL via TOPICAL

## 2023-05-12 MED ORDER — VANCOMYCIN HCL 1000 MG IV SOLR
INTRAVENOUS | Status: AC
  Filled 2023-05-12: qty 20

## 2023-05-12 MED ORDER — ACETAMINOPHEN 160 MG/5ML PO SOLN: 325.0000 mg | ORAL | Status: DC | PRN

## 2023-05-12 MED ORDER — TRANEXAMIC ACID-NACL 1000-0.7 MG/100ML-% IV SOLN
1000.0000 mg | Freq: Once | INTRAVENOUS | Status: AC
  Administered 2023-05-12: 1000 mg via INTRAVENOUS
  Filled 2023-05-12: qty 100

## 2023-05-12 MED ORDER — CELECOXIB 200 MG PO CAPS
ORAL_CAPSULE | ORAL | Status: AC
  Administered 2023-05-12: 200 mg via ORAL
  Filled 2023-05-12: qty 1

## 2023-05-12 MED ORDER — OXYCODONE HCL 5 MG PO TABS
10.0000 mg | ORAL_TABLET | ORAL | Status: DC | PRN
Start: 2023-05-12 — End: 2023-05-13
  Administered 2023-05-13: 10 mg via ORAL
  Administered 2023-05-13: 15 mg via ORAL
  Filled 2023-05-12: qty 2
  Filled 2023-05-12: qty 3

## 2023-05-12 MED ORDER — BUPIVACAINE-MELOXICAM ER 400-12 MG/14ML IJ SOLN
INTRAMUSCULAR | Status: DC | PRN
  Administered 2023-05-12: 400 mg

## 2023-05-12 MED ORDER — GABAPENTIN 300 MG PO CAPS: 300.0000 mg | ORAL_CAPSULE | ORAL | Status: DC

## 2023-05-12 MED ORDER — EPHEDRINE SULFATE-NACL 50-0.9 MG/10ML-% IV SOSY
PREFILLED_SYRINGE | INTRAVENOUS | Status: DC | PRN
  Administered 2023-05-12: 10 mg via INTRAVENOUS
  Administered 2023-05-12 (×2): 5 mg via INTRAVENOUS

## 2023-05-12 MED ORDER — OXYCODONE HCL 5 MG/5ML PO SOLN: 5.0000 mg | Freq: Once | ORAL | Status: DC | PRN

## 2023-05-12 MED ORDER — VANCOMYCIN HCL 1000 MG IV SOLR
INTRAVENOUS | Status: DC | PRN
  Administered 2023-05-12: 1000 mg via TOPICAL

## 2023-05-12 MED ORDER — CHLORHEXIDINE GLUCONATE 0.12 % MT SOLN: 15.0000 mL | Freq: Once | OROMUCOSAL | Status: AC

## 2023-05-12 MED ORDER — MENTHOL 3 MG MT LOZG: 1.0000 | LOZENGE | OROMUCOSAL | Status: DC | PRN

## 2023-05-12 MED ORDER — TRANEXAMIC ACID-NACL 1000-0.7 MG/100ML-% IV SOLN
1000.0000 mg | INTRAVENOUS | Status: AC
  Administered 2023-05-12: 1000 mg via INTRAVENOUS

## 2023-05-12 MED ORDER — CHLORHEXIDINE GLUCONATE 0.12 % MT SOLN
OROMUCOSAL | Status: AC
Start: 2023-05-12 — End: 2023-05-12
  Administered 2023-05-12: 15 mL via OROMUCOSAL
  Filled 2023-05-12: qty 15

## 2023-05-12 MED ORDER — ACETAMINOPHEN 500 MG PO TABS
1000.0000 mg | ORAL_TABLET | Freq: Four times a day (QID) | ORAL | Status: AC
Start: 2023-05-12 — End: 2023-05-13
  Administered 2023-05-12 – 2023-05-13 (×4): 1000 mg via ORAL
  Filled 2023-05-12 (×4): qty 2

## 2023-05-12 MED ORDER — EMPAGLIFLOZIN 25 MG PO TABS
25.0000 mg | ORAL_TABLET | Freq: Every morning | ORAL | Status: DC
  Administered 2023-05-13: 25 mg via ORAL
  Filled 2023-05-12: qty 1

## 2023-05-12 MED ORDER — ORAL CARE MOUTH RINSE: 15.0000 mL | Freq: Once | OROMUCOSAL | Status: AC

## 2023-05-12 MED ORDER — LACTATED RINGERS IV SOLN: INTRAVENOUS | Status: DC | PRN

## 2023-05-12 MED ORDER — BUPIVACAINE-MELOXICAM ER 400-12 MG/14ML IJ SOLN
INTRAMUSCULAR | Status: AC
  Filled 2023-05-12: qty 1

## 2023-05-12 MED ORDER — CEFAZOLIN SODIUM-DEXTROSE 2-4 GM/100ML-% IV SOLN
2.0000 g | INTRAVENOUS | Status: AC
  Administered 2023-05-12: 2 g via INTRAVENOUS

## 2023-05-12 MED ORDER — METOPROLOL TARTRATE 25 MG PO TABS
25.0000 mg | ORAL_TABLET | Freq: Two times a day (BID) | ORAL | Status: DC
  Administered 2023-05-12 – 2023-05-13 (×2): 25 mg via ORAL
  Filled 2023-05-12 (×2): qty 1

## 2023-05-12 MED ORDER — METHOCARBAMOL 500 MG PO TABS
500.0000 mg | ORAL_TABLET | Freq: Four times a day (QID) | ORAL | Status: DC | PRN
  Administered 2023-05-12: 500 mg via ORAL
  Filled 2023-05-12: qty 1

## 2023-05-12 MED ORDER — TRANEXAMIC ACID-NACL 1000-0.7 MG/100ML-% IV SOLN
INTRAVENOUS | Status: AC
  Filled 2023-05-12: qty 100

## 2023-05-12 MED ORDER — PROPOFOL 10 MG/ML IV BOLUS
INTRAVENOUS | Status: DC | PRN
  Administered 2023-05-12: 20 mg via INTRAVENOUS

## 2023-05-12 MED ORDER — 0.9 % SODIUM CHLORIDE (POUR BTL) OPTIME
TOPICAL | Status: DC | PRN
  Administered 2023-05-12: 1000 mL

## 2023-05-12 MED ORDER — METOCLOPRAMIDE HCL 5 MG PO TABS: 5.0000 mg | ORAL_TABLET | Freq: Three times a day (TID) | ORAL | Status: DC | PRN

## 2023-05-12 MED ORDER — ACETAMINOPHEN 500 MG PO TABS
ORAL_TABLET | ORAL | Status: AC
  Administered 2023-05-12: 1000 mg via ORAL
  Filled 2023-05-12: qty 2

## 2023-05-12 MED ORDER — ONDANSETRON HCL 4 MG PO TABS: 4.0000 mg | ORAL_TABLET | Freq: Four times a day (QID) | ORAL | Status: DC | PRN

## 2023-05-12 MED ORDER — DEXAMETHASONE SODIUM PHOSPHATE 10 MG/ML IJ SOLN
10.0000 mg | Freq: Once | INTRAMUSCULAR | Status: AC
  Administered 2023-05-13: 10 mg via INTRAVENOUS
  Filled 2023-05-12: qty 1

## 2023-05-12 MED ORDER — ONDANSETRON HCL 4 MG/2ML IJ SOLN
INTRAMUSCULAR | Status: DC | PRN
  Administered 2023-05-12: 4 mg via INTRAVENOUS

## 2023-05-12 MED ORDER — ACETAMINOPHEN 325 MG PO TABS: 325.0000 mg | ORAL_TABLET | ORAL | Status: DC | PRN

## 2023-05-12 MED ORDER — CEFAZOLIN SODIUM-DEXTROSE 2-4 GM/100ML-% IV SOLN
INTRAVENOUS | Status: AC
Start: 2023-05-12 — End: 2023-05-12
  Filled 2023-05-12: qty 100

## 2023-05-12 MED ORDER — MIDAZOLAM HCL 2 MG/2ML IJ SOLN
INTRAMUSCULAR | Status: AC
  Filled 2023-05-12: qty 2

## 2023-05-12 MED ORDER — ACETAMINOPHEN 325 MG PO TABS
325.0000 mg | ORAL_TABLET | Freq: Four times a day (QID) | ORAL | Status: DC | PRN
  Administered 2023-05-13: 650 mg via ORAL
  Filled 2023-05-12: qty 2

## 2023-05-12 MED ORDER — METHOCARBAMOL 1000 MG/10ML IJ SOLN: 500.0000 mg | Freq: Four times a day (QID) | INTRAMUSCULAR | Status: DC | PRN

## 2023-05-12 MED ORDER — PHENYLEPHRINE 80 MCG/ML (10ML) SYRINGE FOR IV PUSH (FOR BLOOD PRESSURE SUPPORT)
PREFILLED_SYRINGE | INTRAVENOUS | Status: DC | PRN
  Administered 2023-05-12: 80 ug via INTRAVENOUS
  Administered 2023-05-12 (×3): 160 ug via INTRAVENOUS
  Administered 2023-05-12 (×3): 80 ug via INTRAVENOUS
  Administered 2023-05-12 (×2): 160 ug via INTRAVENOUS
  Administered 2023-05-12: 80 ug via INTRAVENOUS

## 2023-05-12 MED ORDER — PROPOFOL 10 MG/ML IV BOLUS
INTRAVENOUS | Status: AC
  Filled 2023-05-12: qty 20

## 2023-05-12 MED ORDER — FENTANYL CITRATE (PF) 250 MCG/5ML IJ SOLN
INTRAMUSCULAR | Status: AC
  Filled 2023-05-12: qty 5

## 2023-05-12 MED ORDER — ACETAMINOPHEN 500 MG PO TABS: 1000.0000 mg | ORAL_TABLET | Freq: Once | ORAL | Status: AC

## 2023-05-12 MED ORDER — FERROUS SULFATE 325 (65 FE) MG PO TABS
325.0000 mg | ORAL_TABLET | Freq: Three times a day (TID) | ORAL | Status: DC
  Administered 2023-05-12 – 2023-05-13 (×3): 325 mg via ORAL
  Filled 2023-05-12 (×3): qty 1

## 2023-05-12 MED ORDER — DEXAMETHASONE SODIUM PHOSPHATE 10 MG/ML IJ SOLN
INTRAMUSCULAR | Status: DC | PRN
  Administered 2023-05-12: 10 mg via INTRAVENOUS

## 2023-05-12 MED ORDER — PHENYLEPHRINE 80 MCG/ML (10ML) SYRINGE FOR IV PUSH (FOR BLOOD PRESSURE SUPPORT)
PREFILLED_SYRINGE | INTRAVENOUS | Status: AC
  Filled 2023-05-12: qty 10

## 2023-05-12 MED ORDER — MEPERIDINE HCL 25 MG/ML IJ SOLN: 6.2500 mg | INTRAMUSCULAR | Status: DC | PRN

## 2023-05-12 MED ORDER — TRANEXAMIC ACID 1000 MG/10ML IV SOLN
INTRAVENOUS | Status: DC | PRN
  Administered 2023-05-12: 2000 mg via TOPICAL

## 2023-05-12 MED ORDER — ONDANSETRON HCL 4 MG/2ML IJ SOLN: 4.0000 mg | Freq: Four times a day (QID) | INTRAMUSCULAR | Status: DC | PRN

## 2023-05-12 MED ORDER — BUPIVACAINE IN DEXTROSE 0.75-8.25 % IT SOLN
INTRATHECAL | Status: DC | PRN
  Administered 2023-05-12: 2 mL via INTRATHECAL

## 2023-05-12 MED ORDER — PROPOFOL 500 MG/50ML IV EMUL
INTRAVENOUS | Status: DC | PRN
  Administered 2023-05-12: 100 ug/kg/min via INTRAVENOUS

## 2023-05-12 MED ORDER — SODIUM CHLORIDE 0.9 % IR SOLN
Status: DC | PRN
  Administered 2023-05-12: 3000 mL

## 2023-05-12 MED ORDER — OXYCODONE HCL 5 MG PO TABS: 5.0000 mg | ORAL_TABLET | Freq: Once | ORAL | Status: DC | PRN

## 2023-05-12 MED ORDER — KETOROLAC TROMETHAMINE 15 MG/ML IJ SOLN
7.5000 mg | Freq: Four times a day (QID) | INTRAMUSCULAR | Status: AC
  Administered 2023-05-12 – 2023-05-13 (×4): 7.5 mg via INTRAVENOUS
  Filled 2023-05-12 (×5): qty 1

## 2023-05-12 MED ORDER — DEXAMETHASONE SODIUM PHOSPHATE 10 MG/ML IJ SOLN
INTRAMUSCULAR | Status: AC
  Filled 2023-05-12: qty 1

## 2023-05-12 MED ORDER — GLYCOPYRROLATE 0.2 MG/ML IJ SOLN
INTRAMUSCULAR | Status: DC | PRN
  Administered 2023-05-12: .2 mg via INTRAVENOUS

## 2023-05-12 MED ORDER — ROCURONIUM BROMIDE 10 MG/ML (PF) SYRINGE
PREFILLED_SYRINGE | INTRAVENOUS | Status: AC
  Filled 2023-05-12: qty 10

## 2023-05-12 MED ORDER — FENTANYL CITRATE (PF) 250 MCG/5ML IJ SOLN
INTRAMUSCULAR | Status: DC | PRN
Start: 2023-05-12 — End: 2023-05-12
  Administered 2023-05-12: 50 ug via INTRAVENOUS
  Administered 2023-05-12: 100 ug via INTRAVENOUS

## 2023-05-12 SURGICAL SUPPLY — 78 items
ALCOHOL 70% 16 OZ (MISCELLANEOUS) ×1 IMPLANT
BAG COUNTER SPONGE SURGICOUNT (BAG) IMPLANT
BAG DECANTER FOR FLEXI CONT (MISCELLANEOUS) ×1 IMPLANT
BANDAGE ESMARK 6X9 LF (GAUZE/BANDAGES/DRESSINGS) IMPLANT
BIT DRILL QUICK REL 1/8 2PK SL (BIT) IMPLANT
BLADE SAG 18X100X1.27 (BLADE) ×1 IMPLANT
BLADE SAW SGTL 73X25 THK (BLADE) ×1 IMPLANT
BNDG ESMARK 6X9 LF (GAUZE/BANDAGES/DRESSINGS) ×1 IMPLANT
BOWL SMART MIX CTS (DISPOSABLE) ×1 IMPLANT
CEMENT BONE REFOBACIN R1X40 US (Cement) IMPLANT
CLSR STERI-STRIP ANTIMIC 1/2X4 (GAUZE/BANDAGES/DRESSINGS) ×2 IMPLANT
COMP TIB KNEE PS G 0D LT (Joint) ×1 IMPLANT
COMPONENT TIB KNEE PS G 0D LT (Joint) IMPLANT
COOLER ICEMAN CLASSIC (MISCELLANEOUS) ×1 IMPLANT
COVER SURGICAL LIGHT HANDLE (MISCELLANEOUS) ×1 IMPLANT
CUFF TOURN SGL QUICK 42 (TOURNIQUET CUFF) IMPLANT
CUFF TRNQT CYL 34X4.125X (TOURNIQUET CUFF) ×1 IMPLANT
DERMABOND ADVANCED .7 DNX12 (GAUZE/BANDAGES/DRESSINGS) ×1 IMPLANT
DRAPE EXTREMITY T 121X128X90 (DISPOSABLE) ×1 IMPLANT
DRAPE HALF SHEET 40X57 (DRAPES) ×1 IMPLANT
DRAPE INCISE IOBAN 66X45 STRL (DRAPES) ×1 IMPLANT
DRAPE POUCH INSTRU U-SHP 10X18 (DRAPES) ×1 IMPLANT
DRAPE SURG ORHT 6 SPLT 77X108 (DRAPES) IMPLANT
DRAPE U-SHAPE 47X51 STRL (DRAPES) ×2 IMPLANT
DRSG AQUACEL AG ADV 3.5X10 (GAUZE/BANDAGES/DRESSINGS) ×1 IMPLANT
DURAPREP 26ML APPLICATOR (WOUND CARE) ×3 IMPLANT
ELECT CAUTERY BLADE 6.4 (BLADE) ×1 IMPLANT
ELECT PENCIL ROCKER SW 15FT (MISCELLANEOUS) ×1 IMPLANT
ELECT REM PT RETURN 9FT ADLT (ELECTROSURGICAL) ×1 IMPLANT
ELECTRODE REM PT RTRN 9FT ADLT (ELECTROSURGICAL) ×1 IMPLANT
GLOVE BIOGEL PI IND STRL 7.0 (GLOVE) ×2 IMPLANT
GLOVE BIOGEL PI IND STRL 7.5 (GLOVE) ×5 IMPLANT
GLOVE ECLIPSE 7.0 STRL STRAW (GLOVE) ×3 IMPLANT
GLOVE INDICATOR 7.0 STRL GRN (GLOVE) ×1 IMPLANT
GLOVE INDICATOR 7.5 STRL GRN (GLOVE) ×1 IMPLANT
GLOVE SURG SYN 7.5 E (GLOVE) ×2 IMPLANT
GLOVE SURG SYN 7.5 PF PI (GLOVE) ×2 IMPLANT
GLOVE SURG UNDER LTX SZ7.5 (GLOVE) ×2 IMPLANT
GLOVE SURG UNDER POLY LF SZ7 (GLOVE) ×2 IMPLANT
GOWN STRL REUS W/ TWL LRG LVL3 (GOWN DISPOSABLE) ×1 IMPLANT
GOWN STRL SURGICAL XL XLNG (GOWN DISPOSABLE) ×1 IMPLANT
GOWN TOGA ZIPPER T7+ PEEL AWAY (MISCELLANEOUS) ×2 IMPLANT
HOOD PEEL AWAY T7 (MISCELLANEOUS) ×1 IMPLANT
KIT BASIN OR (CUSTOM PROCEDURE TRAY) ×1 IMPLANT
KIT TURNOVER KIT B (KITS) ×1 IMPLANT
LINER TIB ASF PS GH/8-11 14 LT (Liner) IMPLANT
MANIFOLD NEPTUNE II (INSTRUMENTS) ×1 IMPLANT
MARKER SKIN DUAL TIP RULER LAB (MISCELLANEOUS) ×2 IMPLANT
NDL SPNL 18GX3.5 QUINCKE PK (NEEDLE) ×1 IMPLANT
NEEDLE SPNL 18GX3.5 QUINCKE PK (NEEDLE) ×1 IMPLANT
NS IRRIG 1000ML POUR BTL (IV SOLUTION) ×1 IMPLANT
PACK TOTAL JOINT (CUSTOM PROCEDURE TRAY) ×1 IMPLANT
PAD ARMBOARD 7.5X6 YLW CONV (MISCELLANEOUS) ×2 IMPLANT
PAD COLD SHLDR WRAP-ON (PAD) ×1 IMPLANT
PIN DRILL HDLS TROCAR 75 4PK (PIN) IMPLANT
PSN FEM CR CMT CCR STD SZ10 L (Joint) ×1 IMPLANT
SCREW FEMALE HEX FIX 25X2.5 (ORTHOPEDIC DISPOSABLE SUPPLIES) IMPLANT
SET HNDPC FAN SPRY TIP SCT (DISPOSABLE) ×1 IMPLANT
SET PIN GUIDE PS PAT SPEC (INSTRUMENTS) IMPLANT
SOLUTION PRONTOSAN WOUND 350ML (IRRIGATION / IRRIGATOR) ×1 IMPLANT
STAPLER VISISTAT 35W (STAPLE) IMPLANT
STEM POLY PAT PLY 35M KNEE (Knees) IMPLANT
SUCTION TUBE FRAZIER 10FR DISP (SUCTIONS) ×1 IMPLANT
SURFACE ARTC PRSNA CCR SZ10 L (Joint) IMPLANT
SUT ETHILON 2 0 FS 18 (SUTURE) IMPLANT
SUT ETHILON 2 0 PSLX (SUTURE) IMPLANT
SUT MNCRL AB 3-0 PS2 27 (SUTURE) IMPLANT
SUT STRATAFIX 1PDS 45CM VIOLET (SUTURE) IMPLANT
SUT VIC AB 0 CT1 27XBRD ANBCTR (SUTURE) ×2 IMPLANT
SUT VIC AB 1 CTX 27 (SUTURE) ×3 IMPLANT
SUT VIC AB 2-0 CT1 TAPERPNT 27 (SUTURE) ×4 IMPLANT
SYR 50ML LL SCALE MARK (SYRINGE) ×2 IMPLANT
TOWEL GREEN STERILE (TOWEL DISPOSABLE) ×1 IMPLANT
TOWEL GREEN STERILE FF (TOWEL DISPOSABLE) ×1 IMPLANT
TRAY CATH INTERMITTENT SS 16FR (CATHETERS) IMPLANT
TUBE SUCT ARGYLE STRL (TUBING) ×1 IMPLANT
UNDERPAD 30X36 HEAVY ABSORB (UNDERPADS AND DIAPERS) ×1 IMPLANT
YANKAUER SUCT BULB TIP NO VENT (SUCTIONS) ×2 IMPLANT

## 2023-05-12 NOTE — Evaluation (Signed)
 Physical Therapy Evaluation Patient Details Name: Jeffrey [name] MRN: [id number] DOB: 1956/01/22 Today's Date: 05/12/2023  History of Present Illness  68 y.o. male presents to [facility] on 05/12/2023 for elective L TKA. PMH includes infectious arthropathy, afib with RVR.  Clinical Impression  Pt is a 68 y.o. male presenting on 05/12/23 with above conditions and deficits below, see PT Problem List. PTA pt used a RW for mobility, and lived in a one story home with his wife and 3 STE. Currently pt is supervision for bed mobility, min A for transfers, and CGA for ambulation with RW. Pt displays deficits in LE strength & AROM, functional mobility, gait, activity tolerance, pain, and balance and would benefit from continued acute PT until d/c. Given pt's PLOF, family support, and anticipated progress he would benefit from further acute instruction regarding transfers, gait, stairs, and functional mobility. Pt knee HEP handout and education provided and questions answered. Will continue to follow acutely.          If plan is discharge home, recommend the following: A little help with walking and/or transfers;A little help with bathing/dressing/bathroom;Assistance with cooking/housework;Assist for transportation;Help with stairs or ramp for entrance   Can travel by private vehicle        Equipment Recommendations None recommended by PT  Recommendations for Other Services       Functional Status Assessment Patient has had a recent decline in their functional status and demonstrates the ability to make significant improvements in function in a reasonable and predictable amount of time.     Precautions / Restrictions Precautions Precautions: Fall Recall of Precautions/Restrictions: Intact Restrictions Weight Bearing Restrictions Per Provider Order: No      Mobility  Bed Mobility Overal bed mobility: Needs Assistance Bed Mobility: Supine to Sit, Sit to Supine     Supine to sit: Supervision,  HOB elevated, Used rails Sit to supine: Supervision, HOB elevated, Used rails   General bed mobility comments: Pt requires increased time for all bed mobility. HOB elevated    Transfers Overall transfer level: Needs assistance Equipment used: Rolling walker (2 wheels) Transfers: Sit to/from Stand Sit to Stand: Min assist           General transfer comment: Min A to power up the first 25% from low bed height. Performs with increased trunk flexion and extra time    Ambulation/Gait Ambulation/Gait assistance: Contact guard assist Gait Distance (Feet): 50 Feet Assistive device: Rolling walker (2 wheels) Gait Pattern/deviations: Step-to pattern, Decreased stride length, Wide base of support Gait velocity: reduced Gait velocity interpretation: <1.8 ft/sec, indicate of risk for recurrent falls   General Gait Details: Pt walks with wide BOS and increased L knee extension throughout gait cycle.  Stairs            Wheelchair Mobility     Tilt Bed    Modified Rankin (Stroke Patients Only)       Balance Overall balance assessment: Needs assistance Sitting-balance support: No upper extremity supported, Feet supported Sitting balance-Leahy Scale: Good Sitting balance - Comments: Pt sits EOB without LOB and slight posterior lean s/t increased L knee extension Postural control: Posterior lean (slight) Standing balance support: During functional activity, Bilateral upper extremity supported, Reliant on assistive device for balance Standing balance-Leahy Scale: Poor Standing balance comment: Pt stands with bil UEs on RW, decreased L knee flexion, and wide BOS. No LOB  Pertinent Vitals/Pain Pain Assessment Pain Assessment: Faces Faces Pain Scale: Hurts a little bit Pain Location: L knee Pain Descriptors / Indicators: Grimacing Pain Intervention(s): Monitored during session    Home Living Family/patient expects to be discharged to::  Private residence Living Arrangements: Spouse/significant other Available Help at Discharge: Family Type of Home: House Home Access: Stairs to enter   Secretary/administrator of Steps: 3   Home Layout: One level Home Equipment: Agricultural consultant (2 wheels);Cane - single point;Shower seat - built in;Grab bars - toilet;Grab bars - tub/shower;Lift chair Additional Comments: [place].  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    Prior Function Prior Level of Function : Independent/Modified Independent;Driving             Mobility Comments: ambulated with RW ADLs Comments: independent     Extremity/Trunk Assessment   Upper Extremity Assessment Upper Extremity Assessment: Defer to OT evaluation    Lower Extremity Assessment Lower Extremity Assessment: Generalized weakness;RLE deficits/detail;LLE deficits/detail RLE Sensation: history of peripheral neuropathy LLE Sensation: history of peripheral neuropathy    Cervical / Trunk Assessment Cervical / Trunk Assessment: Normal  Communication   Communication Communication: No apparent difficulties    Cognition Arousal: Alert Behavior During Therapy: WFL for tasks assessed/performed   PT - Cognitive impairments: No apparent impairments                         Following commands: Intact       Cueing Cueing Techniques: Verbal cues     General Comments General comments (skin integrity, edema, etc.): supine BP: 156/80    Exercises     Assessment/Plan    PT Assessment Patient needs continued PT services  PT Problem List Decreased strength;Decreased range of motion;Decreased activity tolerance;Decreased balance;Decreased mobility;Decreased coordination;Cardiopulmonary status limiting activity;Impaired sensation;Pain       PT Treatment Interventions DME instruction;Gait training;Stair training;Functional mobility  training;Therapeutic activities;Therapeutic exercise;Balance training;Neuromuscular re-education;Patient/family education    PT Goals (Current goals can be found in the Care Plan section)  Acute Rehab PT Goals Patient Stated Goal: return to baseline PT Goal Formulation: With patient Time For Goal Achievement: 05/26/23 Potential to Achieve Goals: Good    Frequency       Co-evaluation               AM-PAC PT "6 Clicks" Mobility  Outcome Measure Help needed turning from your back to your side while in a flat bed without using bedrails?: A Little Help needed moving from lying on your back to sitting on the side of a flat bed without using bedrails?: A Little Help needed moving to and from a bed to a chair (including a wheelchair)?: A Little Help needed standing up from a chair using your arms (e.g., wheelchair or bedside chair)?: A Little Help needed to walk in hospital room?: A Little Help needed climbing 3-5 steps with a railing? : A Lot 6 Click Score: 17    End of Session Equipment Utilized During Treatment: Gait belt Activity Tolerance: Patient tolerated treatment well Patient left: in bed;with call bell/phone within reach;with bed alarm set Nurse Communication: Mobility status PT Visit Diagnosis: Muscle weakness (generalized) (M62.81);Difficulty in walking, not elsewhere classified (R26.2);Pain;Other abnormalities of gait and mobility (R26.89) Pain - Right/Left: Left Pain - part of body: Knee    Time: 9604-5409 PT Time Calculation (min) (ACUTE ONLY):  37 min   Charges:   PT Evaluation $PT Eval Low Complexity: 1 Low   PT General Charges $$ ACUTE PT VISIT: 1 Visit         [address], SPT   [place] 05/12/2023, 3:49 PM

## 2023-05-12 NOTE — H&P (Signed)
 PREOPERATIVE H&P  Chief Complaint: LEFT KNEE POST TRAUMATIC ARTHRITIS  HPI: Jeffrey [name] is a 68 y.o. male who presents for surgical treatment of LEFT KNEE POST TRAUMATIC ARTHRITIS.  He denies any changes in medical history.  Past Surgical History:  Procedure Laterality Date  . APPENDECTOMY  1960  . I & D EXTREMITY Right 06/19/2020   Procedure: IRRIGATION AND DEBRIDEMENT LONG FINGER;  Surgeon: Betha Loa, MD;  Location: [facility] OR;  Service: Orthopedics;  Laterality: Right;  . LEFT HEART CATH  06/20/2018  . ORIF FEMUR FRACTURE  1988  . VARICOSE VEIN SURGERY Right    right leg   Social History   Socioeconomic History  . Marital status: Married    Spouse name: Danford Bad  . Number of children: 2  . Years of education: Not on file  . 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 Smoking status: Former    Current packs/day: 0.00    Types: Cigarettes    Quit date: 11/19/2012    Years since quitting: 10.4  . Smokeless tobacco: Never  Vaping Use  . Vaping status: Never Used  Substance and Sexual Activity  . Alcohol use: No    Alcohol/week: 0.0 standard drinks of alcohol  . Drug use: No  . Sexual activity: Not on file  Other Topics Concern  . Not on file  Social History Narrative   Lives with wife   Social Drivers of Health   Financial Resource Strain: Not on file  Food Insecurity: No Food Insecurity (11/27/2021)   Hunger Vital Sign   . Worried About Programme researcher, broadcasting/film/video in the Last Year: Never true   . Ran Out of Food in the Last Year: Never true  Transportation Needs: No Transportation Needs (11/27/2021)   PRAPARE - Transportation   . Lack of Transportation (Medical): No   . Lack of Transportation (Non-Medical): No  Physical Activity: Not on file  Stress: Not on file  Social Connections: Not on file   Family History  Problem Relation Age of Onset  . Vision loss Mother   . Diabetes Mother   . Crohn's  disease Father    Allergies  Allergen Reactions  . Shellfish-Derived Products Other (See Comments)    GI upset- "this tears up my stomach"   Prior to Admission medications   Medication Sig Start Date End Date Taking? Authorizing Provider  acetaminophen (TYLENOL) 325 MG tablet Take 650 mg by mouth every 6 (six) hours as needed for moderate pain or headache.   Yes [provider]  allopurinol (ZYLOPRIM) 100 MG tablet Take 100 mg by mouth daily. 01/22/21  Yes [provider]  apixaban (ELIQUIS) 5 MG TABS tablet Take 5 mg by mouth 2 (two) times daily.   Yes [provider]  ascorbic acid (VITAMIN C) 500 MG tablet Take 500 mg by mouth daily.   Yes [provider]  atorvastatin (LIPITOR) 80 MG tablet Take 80 mg by mouth at bedtime. 02/24/17  Yes [provider]  citalopram (CELEXA) 20 MG tablet Take 10 mg by mouth in the morning. 06/17/20  Yes [provider]  empagliflozin (JARDIANCE) 25 MG TABS tablet Take 12.5 mg by mouth in the morning.   Yes [provider]  famotidine (PEPCID) 20 MG tablet Take 20 mg by mouth 2 (two) times daily.   Yes [provider]  furosemide (LASIX) 20 MG tablet Take 20  mg by mouth in the morning. 11/26/18  Yes [provider]  gabapentin (NEURONTIN) 300 MG capsule Take 300-600 mg by mouth See admin instructions. Take 1 capsule (300 mg) by mouth once daily in the morning & take 2 capsules (600 mg) by mouth at bedtime. 09/17/20  Yes [provider]  hydrOXYzine (VISTARIL) 25 MG capsule Take by mouth. 03/16/23  Yes [provider]  metoprolol tartrate (LOPRESSOR) 50 MG tablet Take 25 mg by mouth 2 (two) times daily. 11/16/14  Yes [provider]  Multiple Vitamins-Minerals (PRESERVISION AREDS 2) CAPS Chew 1 capsule by mouth in the morning and at bedtime.   Yes [provider]  nitroGLYCERIN (NITROSTAT) 0.4 MG SL tablet Place 0.4 mg under the tongue every 5 (five)  minutes x 3 doses as needed for chest pain.   Yes [provider]  ranolazine (RANEXA) 500 MG 12 hr tablet Take 500 mg by mouth 2 (two) times daily. 07/26/16  Yes [provider]  zinc sulfate, 50mg  elemental zinc, 220 (50 Zn) MG capsule Take 220 mg by mouth daily.   Yes [provider]  calcium carbonate (OSCAL) 1500 (600 Ca) MG TABS tablet Take 1 tablet (1,500 mg total) by mouth daily. 04/27/22 05/27/22  Tarry Kos, MD  colchicine 0.6 MG tablet Take 0.6 mg by mouth daily as needed (gout flares).    [provider]  docusate sodium (COLACE) 100 MG capsule Take 1 capsule (100 mg total) by mouth daily as needed. 05/03/23 05/02/24  Cristie Hem, PA-C  methocarbamol (ROBAXIN-750) 750 MG tablet Take 1 tablet (750 mg total) by mouth 3 (three) times daily as needed for muscle spasms. 05/03/23   Cristie Hem, PA-C  ondansetron (ZOFRAN) 4 MG tablet Take 1 tablet (4 mg total) by mouth every 8 (eight) hours as needed for nausea or vomiting. 05/03/23   Cristie Hem, PA-C  oxyCODONE-acetaminophen (PERCOCET) 5-325 MG tablet Take 1-2 tablets by mouth every 6 (six) hours as needed. To be taken after surgery 05/03/23   Cristie Hem, PA-C     Positive ROS: All other systems have been reviewed and were otherwise negative with the exception of those mentioned in the HPI and as above.  Physical Exam: General: Alert, no acute distress Cardiovascular: No pedal edema Respiratory: No cyanosis, no use of accessory musculature GI: abdomen soft Skin: No lesions in the area of chief complaint Neurologic: Sensation intact distally Psychiatric: Patient is competent for consent with normal mood and affect Lymphatic: no lymphedema  MUSCULOSKELETAL: exam stable  Assessment: LEFT KNEE POST TRAUMATIC ARTHRITIS  Plan: Plan for Procedure(s): LEFT TOTAL KNEE ARTHROPLASTY  The risks benefits and alternatives were discussed with the patient including but not limited to the  risks of nonoperative treatment, versus surgical intervention including infection, bleeding, nerve injury,  blood clots, cardiopulmonary complications, morbidity, mortality, among others, and they were willing to proceed.   Glee Arvin, MD 05/12/2023 6:12 AM

## 2023-05-12 NOTE — Op Note (Signed)
 Total Knee Arthroplasty Procedure Note  Preoperative diagnosis: Left knee osteoarthritis  Postoperative diagnosis:same  Operative findings: Complete loss of articular cartilage from patella surface and lateral femoral condyle  Operative procedure: Left total knee arthroplasty. CPT [phone]  Surgeon: N. Glee Arvin, MD  Assist: Oneal Grout, PA-C; necessary for the timely completion of procedure and due to complexity of procedure.  Anesthesia: Spinal, regional, local  Tourniquet time: see anesthesia record  Implants used: Zimmer persona Femur: CR 10 Tibia: G keeled Patella: 35 mm Polyethylene: 14 mm medial congruent  Indication: Jeffrey [name] is a 68 y.o. year old male with a history of posttraumatic arthritis with retained femoral nail. Having failed conservative management, the patient elected to proceed with a total knee arthroplasty.  We have reviewed the risk and benefits of the surgery and they elected to proceed after voicing understanding.  Procedure:  After informed consent was obtained and understanding of the risk were voiced including but not limited to bleeding, infection, damage to surrounding structures including nerves and vessels, blood clots, leg length inequality and the failure to achieve desired results, the operative extremity was marked with verbal confirmation of the patient in the holding area.   The patient was then brought to the operating room and transported to the operating room table in the supine position.  A tourniquet was applied to the operative extremity around the upper thigh. The operative limb was then prepped and draped in the usual sterile fashion and preoperative antibiotics were administered.  A time out was performed prior to the start of surgery confirming the correct extremity, preoperative antibiotic administration, as well as team members, implants and instruments available for the case. Correct surgical site was also confirmed  with preoperative radiographs. The limb was then elevated for exsanguination and the tourniquet was inflated. A midline incision was made and a standard medial parapatellar approach was performed.  The infrapatellar fat pad was removed.  Suprapatellar synovium was removed to reveal the anterior distal femoral cortex.  A medial peel was performed to release the capsule and the deep MCL off of the medial tibial plateau back to the semimembranosus.  The patella was then everted which showed complete loss of articular cartilage.  This was resected down to 18 mm from 28 mm thick and was prepared and sized to a 35 mm.  A cover was placed on the patella for protection from retractors.  The knee was then brought into full flexion and we then turned our attention to the femur.  The ACL was sacrificed.  The custom femoral block was applied to the distal femur and pinned for distal femoral cut and rotation.  The distal cut was made. Osteophytes were then removed.  Next, the proximal tibial cutting guide was placed with appropriate slope, varus/valgus alignment and depth of resection.  The drop rod was attached to confirm that it was aimed at the second metatarsal.  The proximal tibial cut was made. Gap blocks were then used to assess the extension gap and alignment, and appropriate soft tissue releases were performed. Attention was turned back to the femur.  The 10 size 4-in-1 cutting block was placed and checked with an angel wing and cuts were made. Posterior femoral osteophytes and uncapped bone were then removed with the curved osteotome.  The menisci were removed.  Trial components were placed, and stability was checked in full extension, mid-flexion, and deep flexion.  PCL was retained. Proper tibial rotation was determined and marked.  The patella  tracked well with a lateral release.  The femoral lugs were then drilled. Trial components were then removed and tibial preparation performed.  The trial tibia was pointed  to the medial third of the tibial tubercle.  The tibia was sized for a size G component.   The bony surfaces were irrigated with a pulse lavage and then dried. Bone cement was vacuum mixed on the back table, and the final components sized above were cemented into place.  Antibiotic irrigation was placed in the knee joint and soft tissues while the cement cured.  After cement had finished curing, excess cement was removed. The stability of the construct was re-evaluated throughout a range of motion and found to be acceptable. The trial liner was removed, the knee was copiously irrigated, and the knee was re-evaluated for any excess bone debris. The real polyethylene liner, 14 mm thick, was inserted and checked to ensure the locking mechanism had engaged appropriately. The tourniquet was deflated and hemostasis was achieved. The wound was irrigated with normal saline.  One gram of vancomycin powder was placed in the surgical bed.  Topical mixture of 0.25% bupivacaine and meloxicam was placed in the joint for postoperative pain.  Capsular closure was performed with a #1 stratafix in 45 degrees of flexion, subcutaneous fat closed with a 0 vicryl suture, then subcutaneous tissue closed with interrupted 2.0 vicryl suture. The skin was then closed with a 2.0 nylon and dermabond. A sterile dressing was applied.  The patient was awakened in the operating room and taken to recovery in stable condition. All sponge, needle, and instrument counts were correct at the end of the case.  Tessa Lerner was necessary for opening, closing, retracting, limb positioning and overall facilitation and completion of the surgery.  Position: supine  Complications: none.  Time Out: performed   Drains/Packing: none  Estimated blood loss: minimal  Returned to Recovery Room: in good condition.   Antibiotics: yes   Mechanical VTE (DVT) Prophylaxis: sequential compression devices, TED thigh-high  Chemical VTE (DVT) Prophylaxis:  eliquis  Fluid Replacement  Crystalloid: see anesthesia record Blood: none  FFP: none   Specimens Removed: 1 to pathology   Sponge and Instrument Count Correct? yes   PACU: portable radiograph - knee AP and Lateral   Plan/RTC: Return in 2 weeks for wound check.   Weight Bearing/Load Lower Extremity: full   Implant Name Type Inv. Item Serial No. Manufacturer Lot No. LRB No. Used Action  CEMENT BONE REFOBACIN [id number] [place] - [id number] Cement CEMENT BONE REFOBACIN [id number] [place]  ZIMMER RECON(ORTH,TRAU,BIO,SG) [id number] Left 2 Implanted  COMP TIB KNEE PS G 0D LT - [id number] Joint COMP TIB KNEE PS G 0D LT  ZIMMER RECON(ORTH,TRAU,BIO,SG) [id number] Left 1 Implanted  LINER TIB ASF PS GH/8-11 14 LT - [id number] Liner LINER TIB ASF PS GH/8-11 14 LT  ZIMMER RECON(ORTH,TRAU,BIO,SG) [id number] Left 1 Implanted  PSN FEM CR CMT CCR STD SZ10 L - [id number] Joint PSN FEM CR CMT CCR STD SZ10 L  ZIMMER RECON(ORTH,TRAU,BIO,SG) [id number] Left 1 Implanted  STEM POLY PAT PLY 70M KNEE - [id number] Knees STEM POLY PAT PLY 70M KNEE  ZIMMER RECON(ORTH,TRAU,BIO,SG) [id number] Left 1 Implanted    N. Glee Arvin, MD [employer] 9:14 AM

## 2023-05-12 NOTE — Anesthesia Procedure Notes (Signed)
 Spinal  Patient location during procedure: OR Start time: 05/12/2023 7:35 AM End time: 05/12/2023 7:40 AM Reason for block: surgical anesthesia Staffing Anesthesiologist: Collene Schlichter, MD Performed by: Bethena Midget, MD Authorized by: Bethena Midget, MD   Preanesthetic Checklist Completed: patient identified, IV checked, site marked, risks and benefits discussed, surgical consent, monitors and equipment checked, pre-op evaluation and timeout performed Spinal Block Patient position: sitting Prep: DuraPrep Patient monitoring: heart rate, cardiac monitor, continuous pulse ox and blood pressure Approach: midline Location: L4-5 Injection technique: single-shot Needle Needle type: Sprotte  Needle gauge: 24 G Needle length: 9 cm Assessment Sensory level: T4 Events: CSF return

## 2023-05-12 NOTE — Transfer of Care (Signed)
 Immediate Anesthesia Transfer of Care Note  Patient: Jeffrey [name]  Procedure(s) Performed: LEFT TOTAL KNEE ARTHROPLASTY (Left: Knee)  Patient Location: PACU  Anesthesia Type:MAC and Spinal  Level of Consciousness: awake, alert , and oriented  Airway & Oxygen Therapy: Patient Spontanous Breathing  Post-op Assessment: Report given to RN and Post -op Vital signs reviewed and stable  Post vital signs: Reviewed and stable  Last Vitals:  Vitals Value Taken Time  BP 108/67 05/12/23 0953  Temp    Pulse 55 05/12/23 0958  Resp 15 05/12/23 0958  SpO2 99 % 05/12/23 0958  Vitals shown include unfiled device data.  Last Pain:  Vitals:   05/12/23 0616  TempSrc: (P) Oral  PainSc:          Complications: There were no known notable events for this encounter.

## 2023-05-12 NOTE — Anesthesia Procedure Notes (Addendum)
 Anesthesia Regional Block: Adductor canal block   Pre-Anesthetic Checklist: , timeout performed,  Correct Patient, Correct Site, Correct Laterality,  Correct Procedure, Correct Position, site marked,  Risks and benefits discussed,  Surgical consent,  Pre-op evaluation,  At surgeon's request and post-op pain management  Laterality: Left  Prep: chloraprep       Needles:  Injection technique: Single-shot  Needle Type: Echogenic Stimulator Needle     Needle Length: 5cm  Needle Gauge: 22     Additional Needles:   Procedures:, nerve stimulator,,, ultrasound used (permanent image in chart),,    Narrative:  Start time: 05/12/2023 7:15 AM End time: 05/12/2023 7:20 AM Injection made incrementally with aspirations every 5 mL.  Performed by: Personally  Anesthesiologist: Bethena Midget, MD  Additional Notes: Functioning IV was confirmed and monitors were applied.  A 50mm 22ga Arrow echogenic stimulator needle was used. Sterile prep and drape,hand hygiene and sterile gloves were used. Ultrasound guidance: relevant anatomy identified, needle position confirmed, local anesthetic spread visualized around nerve(s)., vascular puncture avoided.  Image printed for medical record. Negative aspiration and negative test dose prior to incremental administration of local anesthetic. The patient tolerated the procedure well.

## 2023-05-12 NOTE — Discharge Instructions (Signed)

## 2023-05-12 NOTE — Anesthesia Postprocedure Evaluation (Signed)
 Anesthesia Post Note  Patient: Jeffrey [name]  Procedure(s) Performed: LEFT TOTAL KNEE ARTHROPLASTY (Left: Knee)     Patient location during evaluation: PACU Anesthesia Type: Regional and Spinal Level of consciousness: oriented and awake and alert Pain management: pain level controlled Vital Signs Assessment: post-procedure vital signs reviewed and stable Respiratory status: spontaneous breathing, respiratory function stable and patient connected to nasal cannula oxygen Cardiovascular status: blood pressure returned to baseline and stable Postop Assessment: no headache, no backache and no apparent nausea or vomiting Anesthetic complications: no   There were no known notable events for this encounter.  Last Vitals:  Vitals:   05/12/23 1015 05/12/23 1030  BP: (!) 118/59 133/63  Pulse: (!) 54 (!) 57  Resp: 10 19  Temp:    SpO2: 98% 99%    Last Pain:  Vitals:   05/12/23 1015  TempSrc:   PainSc: 0-No pain                 Fawne Hughley

## 2023-05-13 DIAGNOSIS — M1712 Unilateral primary osteoarthritis, left knee: Secondary | ICD-10-CM | POA: Diagnosis not present

## 2023-05-13 LAB — GLUCOSE, CAPILLARY: Glucose-Capillary: 222 mg/dL — ABNORMAL HIGH (ref 70–99)

## 2023-05-13 NOTE — Plan of Care (Signed)

## 2023-05-13 NOTE — Care Management Obs Status (Signed)
 MEDICARE OBSERVATION STATUS NOTIFICATION   Patient Details  Name: Jeffrey [name] MRN: [id number] Date of Birth: [birth year]   Medicare Observation Status Notification Given:  Yes    Isaias Cowman, RN 05/13/2023, 9:44 AM

## 2023-05-13 NOTE — Progress Notes (Signed)
 OT Cancellation Note  Patient Details Name: Jeffrey [name] MRN: [id number] DOB: 1955/09/07   Cancelled Treatment:    Reason Eval/Treat Not Completed: OT screened, no needs identified, will sign off Patient with no acute OT needs, and has assist from his wife at home who is an Charity fundraiser, OT will sign off at this time. Please re-consult if further acute needs arise.   Jeffrey [name], Jeffrey [name] Acute [facility] Services [phone]   Cherlyn Cushing 05/13/2023, 9:28 AM

## 2023-05-13 NOTE — Discharge Summary (Signed)
 Patient ID: Jeffrey [name] MRN: [id number] DOB/AGE: 1955-12-27 68 y.o.  Admit date: 05/12/2023 Discharge date: 05/13/2023  Admission Diagnoses:  Post-traumatic osteoarthritis of left knee  Discharge Diagnoses:  Principal Problem:   Post-traumatic osteoarthritis of left knee Active Problems:   Status post total left knee replacement   Past Medical History:  Diagnosis Date   Aneurysm of infrarenal abdominal aorta (HCC)    CAD (coronary artery disease)    4 stents   Carotid artery disease (HCC)    Left ICA   Diabetes mellitus without complication (HCC)    Dysrhythmia    A. Fib   Hyperlipidemia    Intracerebral hemorrhage (HCC) 1960   Peripheral neuropathy    Post-traumatic osteoarthritis of left knee     Surgeries: Procedure(s): LEFT TOTAL KNEE ARTHROPLASTY on 05/12/2023   Consultants (if any):   Discharged Condition: Improved  Hospital Course: KARLON SCHLAFER is an 69 y.o. male who was admitted 05/12/2023 with a diagnosis of Post-traumatic osteoarthritis of left knee and went to the operating room on 05/12/2023 and underwent the above named procedures.    He was given perioperative antibiotics:  Anti-infectives (From admission, onward)    Start     Dose/Rate Route Frequency Ordered Stop   05/12/23 1345  ceFAZolin (ANCEF) IVPB 2g/100 mL premix        2 g 200 mL/hr over 30 Minutes Intravenous Every 6 hours 05/12/23 1251 05/12/23 2027   05/12/23 0836  vancomycin (VANCOCIN) powder  Status:  Discontinued          As needed 05/12/23 0837 05/12/23 0950   05/12/23 0600  ceFAZolin (ANCEF) IVPB 2g/100 mL premix        2 g 200 mL/hr over 30 Minutes Intravenous On call to O.R. 05/12/23 0543 05/12/23 0743   05/12/23 0551  ceFAZolin (ANCEF) 2-4 GM/100ML-% IVPB       Note to Pharmacy: Dorann Ou N: cabinet override      05/12/23 0551 05/12/23 0743     .  He was given sequential compression devices, early ambulation, and appropriate chemoprophylaxis for DVT  prophylaxis.  He benefited maximally from the hospital stay and there were no complications.    Recent vital signs:  Vitals:   05/12/23 2053 05/13/23 0556  BP: (!) 129/59 137/74  Pulse: 68 68  Resp: 18 18  Temp: 97.9 F (36.6 C) (!) 97.5 F (36.4 C)  SpO2: 95% 97%    Recent laboratory studies:  Lab Results  Component Value Date   HGB 13.7 05/05/2023   HGB 11.4 (L) 11/27/2021   HGB 12.4 (L) 11/26/2021   Lab Results  Component Value Date   WBC 7.9 05/05/2023   PLT 181 05/05/2023   No results found for: "INR" Lab Results  Component Value Date   NA 138 05/05/2023   K 3.7 05/05/2023   CL 103 05/05/2023   CO2 26 05/05/2023   BUN 13 05/05/2023   CREATININE 0.98 05/05/2023   GLUCOSE 168 (H) 05/05/2023    Discharge Medications:   Allergies as of 05/13/2023       Reactions   Shellfish-derived Products Other (See Comments)   GI upset- "this tears up my stomach"        Medication List     STOP taking these medications    acetaminophen 325 MG tablet Commonly known as: TYLENOL       TAKE these medications    allopurinol 100 MG tablet Commonly known as: ZYLOPRIM Take 100 mg  by mouth daily.   ascorbic acid 500 MG tablet Commonly known as: VITAMIN C Take 500 mg by mouth daily.   atorvastatin 80 MG tablet Commonly known as: LIPITOR Take 80 mg by mouth at bedtime.   calcium carbonate 1500 (600 Ca) MG Tabs tablet Commonly known as: OSCAL Take 1 tablet (1,500 mg total) by mouth daily.   citalopram 20 MG tablet Commonly known as: CELEXA Take 10 mg by mouth in the morning.   colchicine 0.6 MG tablet Take 0.6 mg by mouth daily as needed (gout flares).   docusate sodium 100 MG capsule Commonly known as: Colace Take 1 capsule (100 mg total) by mouth daily as needed.   Eliquis 5 MG Tabs tablet Generic drug: apixaban Take 5 mg by mouth 2 (two) times daily.   famotidine 20 MG tablet Commonly known as: PEPCID Take 20 mg by mouth 2 (two) times daily.    furosemide 20 MG tablet Commonly known as: LASIX Take 20 mg by mouth in the morning.   gabapentin 300 MG capsule Commonly known as: NEURONTIN Take 300-600 mg by mouth See admin instructions. Take 1 capsule (300 mg) by mouth once daily in the morning & take 2 capsules (600 mg) by mouth at bedtime.   hydrOXYzine 25 MG capsule Commonly known as: VISTARIL Take by mouth.   Jardiance 25 MG Tabs tablet Generic drug: empagliflozin Take 12.5 mg by mouth in the morning.   methocarbamol 750 MG tablet Commonly known as: Robaxin-750 Take 1 tablet (750 mg total) by mouth 3 (three) times daily as needed for muscle spasms.   metoprolol tartrate 50 MG tablet Commonly known as: LOPRESSOR Take 25 mg by mouth 2 (two) times daily.   nitroGLYCERIN 0.4 MG SL tablet Commonly known as: NITROSTAT Place 0.4 mg under the tongue every 5 (five) minutes x 3 doses as needed for chest pain.   ondansetron 4 MG tablet Commonly known as: Zofran Take 1 tablet (4 mg total) by mouth every 8 (eight) hours as needed for nausea or vomiting.   oxyCODONE-acetaminophen 5-325 MG tablet Commonly known as: Percocet Take 1-2 tablets by mouth every 6 (six) hours as needed. To be taken after surgery   PreserVision AREDS 2 Caps Chew 1 capsule by mouth in the morning and at bedtime.   ranolazine 500 MG 12 hr tablet Commonly known as: RANEXA Take 500 mg by mouth 2 (two) times daily.   zinc sulfate (50mg  elemental zinc) 220 (50 Zn) MG capsule Take 220 mg by mouth daily.               Durable Medical Equipment  (From admission, onward)           Start     Ordered   05/12/23 1252  DME Walker rolling  Once       Question Answer Comment  Walker: With 5 Inch Wheels   Patient needs a walker to treat with the following condition Status post left partial knee replacement      05/12/23 1251   05/12/23 1252  DME 3 n 1  Once        05/12/23 1251   05/12/23 1252  DME Bedside commode  Once       Question:   Patient needs a bedside commode to treat with the following condition  Answer:  Status post left partial knee replacement   05/12/23 1251            Diagnostic Studies: DG Knee Left Port Result Date:  05/12/2023 CLINICAL DATA:  Postop in PACU. EXAM: PORTABLE LEFT KNEE - 1-2 VIEW COMPARISON:  CT 04/10/2023 FINDINGS: Left knee arthroplasty in expected alignment. No periprosthetic lucency or fracture. There has been patellar resurfacing. Recent postsurgical change includes air and edema in the soft tissues and joint space. Previous distal femoral intramedullary nail. IMPRESSION: Left knee arthroplasty without immediate postoperative complication. Electronically Signed   By: Narda Rutherford M.D.   On: 05/12/2023 11:21    Disposition: Discharge disposition: 01-Home or Self Care       Discharge Instructions     Call MD / Call 911   Complete by: As directed    If you experience chest pain or shortness of breath, CALL 911 and be transported to the hospital emergency room.  If you develope a fever above 101.5 F, pus (white drainage) or increased drainage or redness at the wound, or calf pain, call your surgeon's office.   Constipation Prevention   Complete by: As directed    Drink plenty of fluids.  Prune juice may be helpful.  You may use a stool softener, such as Colace (over the counter) 100 mg twice a day.  Use MiraLax (over the counter) for constipation as needed.   Driving restrictions   Complete by: As directed    No driving while taking narcotic pain meds.   Increase activity slowly as tolerated   Complete by: As directed    Post-operative opioid taper instructions:   Complete by: As directed    POST-OPERATIVE OPIOID TAPER INSTRUCTIONS: It is important to wean off of your opioid medication as soon as possible. If you do not need pain medication after your surgery it is ok to stop day one. Opioids include: Codeine, Hydrocodone(Norco, Vicodin), Oxycodone(Percocet, oxycontin) and  hydromorphone amongst others.  Long term and even short term use of opiods can cause: Increased pain response Dependence Constipation Depression Respiratory depression And more.  Withdrawal symptoms can include Flu like symptoms Nausea, vomiting And more Techniques to manage these symptoms Hydrate well Eat regular healthy meals Stay active Use relaxation techniques(deep breathing, meditating, yoga) Do Not substitute Alcohol to help with tapering If you have been on opioids for less than two weeks and do not have pain than it is ok to stop all together.  Plan to wean off of opioids This plan should start within one week post op of your joint replacement. Maintain the same interval or time between taking each dose and first decrease the dose.  Cut the total daily intake of opioids by one tablet each day Next start to increase the time between doses. The last dose that should be eliminated is the evening dose.           Follow-up Information     Cristie Hem, PA-C. Schedule an appointment as soon as possible for a visit in 2 week(s).   Specialty: Orthopedic Surgery Contact information: [address] [place] [address] [phone]                  Signed: Glee Arvin 05/13/2023, 9:14 AM

## 2023-05-13 NOTE — Progress Notes (Signed)
 Physical Therapy Treatment Patient Details Name: Jeffrey [name] MRN: [id number] DOB: June 14, 1955 Today's Date: 05/13/2023   History of Present Illness 68 y.o. male presents to [facility] on 05/12/2023 for elective L TKA. PMH includes infectious arthropathy, afib with RVR.    PT Comments  Pt resting in bed on arrival and agreeable to session with continued progress towards acute goals. Pt requiring grossly CGA fading to supervision for bed mobility, transfers and gait with RW for support. Pt able to demonstrate safe stair ascent/descent with CGA for safety and light cues for LE sequencing on descent. Pt was educated on continued walker use to maximize functional independence, safety, and decrease risk for falls, as well as HEP and compliance, ice, safe car entry/exit and importance of continued mobility with pt verbalizing understanding. Anticipate safe discharge, with assist level outlined below, once medically cleared, will continue to follow acutely.     If plan is discharge home, recommend the following: A little help with walking and/or transfers;A little help with bathing/dressing/bathroom;Assistance with cooking/housework;Assist for transportation;Help with stairs or ramp for entrance   Can travel by private vehicle        Equipment Recommendations  None recommended by PT    Recommendations for Other Services       Precautions / Restrictions Precautions Precautions: Fall Recall of Precautions/Restrictions: Intact Restrictions Weight Bearing Restrictions Per Provider Order: Yes LLE Weight Bearing Per Provider Order: Weight bearing as tolerated     Mobility  Bed Mobility Overal bed mobility: Needs Assistance Bed Mobility: Supine to Sit     Supine to sit: Supervision, HOB elevated     General bed mobility comments: supervision for safety with + bedrail use    Transfers Overall transfer level: Needs assistance Equipment used: Rolling walker (2 wheels) Transfers: Sit  to/from Stand Sit to Stand: Contact guard assist, Supervision           General transfer comment: CGA for safety progressing to supervision from low commode    Ambulation/Gait Ambulation/Gait assistance: Supervision, Contact guard assist Gait Distance (Feet): 225 Feet Assistive device: Rolling walker (2 wheels) Gait Pattern/deviations: Decreased stride length, Wide base of support, Step-through pattern Gait velocity: reduced     General Gait Details: wide BOS with decreased stride length on L, progress from step-to to step through pattern and from CGA to supervision with increased distance   Stairs Stairs: Yes Stairs assistance: Contact guard assist Stair Management: One rail Right, Step to pattern, Forwards, With walker Number of Stairs: 3 General stair comments: up/down with RUE on rail an dRW sideways on L to simulate home set-up and pt familiar with this technique, no LOB, light cues for sequencing on descent   Wheelchair Mobility     Tilt Bed    Modified Rankin (Stroke Patients Only)       Balance Overall balance assessment: Needs assistance Sitting-balance support: No upper extremity supported, Feet supported Sitting balance-Leahy Scale: Good     Standing balance support: During functional activity, Bilateral upper extremity supported, Reliant on assistive device for balance Standing balance-Leahy Scale: Poor Standing balance comment: realint on UE support during dynamic activities                            Communication Communication Communication: No apparent difficulties  Cognition Arousal: Alert Behavior During Therapy: WFL for tasks assessed/performed   PT - Cognitive impairments: No apparent impairments  Following commands: Intact      Cueing Cueing Techniques: Verbal cues  Exercises Other Exercises Other Exercises: reviewed all exercises from HEP handout with pt able to demo back and verbalize  understanding of exercise and frequency    General Comments General comments (skin integrity, edema, etc.): VSS on RA      Pertinent Vitals/Pain Pain Assessment Pain Assessment: Faces Faces Pain Scale: Hurts a little bit Pain Location: L knee Pain Descriptors / Indicators: Grimacing Pain Intervention(s): Monitored during session, Limited activity within patient's tolerance    Home Living                          Prior Function            PT Goals (current goals can now be found in the care plan section) Acute Rehab PT Goals Patient Stated Goal: to go home PT Goal Formulation: With patient Time For Goal Achievement: 05/16/23 Progress towards PT goals: Progressing toward goals    Frequency    7X/week      PT Plan      Co-evaluation              AM-PAC PT "6 Clicks" Mobility   Outcome Measure  Help needed turning from your back to your side while in a flat bed without using bedrails?: A Little Help needed moving from lying on your back to sitting on the side of a flat bed without using bedrails?: A Little Help needed moving to and from a bed to a chair (including a wheelchair)?: A Little Help needed standing up from a chair using your arms (e.g., wheelchair or bedside chair)?: A Little Help needed to walk in hospital room?: A Little Help needed climbing 3-5 steps with a railing? : A Little 6 Click Score: 18    End of Session Equipment Utilized During Treatment: Gait belt Activity Tolerance: Patient tolerated treatment well Patient left: in bed;with call bell/phone within reach;Other (comment) (seated up EOB) Nurse Communication: Mobility status;Other (comment) (pt without need for PM session) PT Visit Diagnosis: Muscle weakness (generalized) (M62.81);Difficulty in walking, not elsewhere classified (R26.2);Pain;Other abnormalities of gait and mobility (R26.89) Pain - Right/Left: Left Pain - part of body: Knee     Time: 4098-1191 PT Time  Calculation (min) (ACUTE ONLY): 30 min  Charges:    $Gait Training: 23-37 mins PT General Charges $$ ACUTE PT VISIT: 1 Visit                     Ziyad Dyar R. PTA Acute Rehabilitation Services Office: [phone]   Catalina Antigua 05/13/2023, 9:38 AM

## 2023-05-13 NOTE — Progress Notes (Addendum)
   Subjective:  Patient reports pain as moderate.  No events. Did well with first PT session.  Objective:   VITALS:   Vitals:   05/12/23 1230 05/12/23 1255 05/12/23 2053 05/13/23 0556  BP: 137/63 (!) 153/77 (!) 129/59 137/74  Pulse: 61 (!) 54 68 68  Resp: 15 16 18 18   Temp: 97.6 F (36.4 C)  97.9 F (36.6 C) (!) 97.5 F (36.4 C)  TempSrc:   Oral Oral  SpO2: 97% 97% 95% 97%  Weight:      Height:        Sensation intact distally Intact pulses distally Dorsiflexion/Plantar flexion intact Incision: dressing C/D/I and no drainage Compartment soft   Lab Results  Component Value Date   WBC 7.9 05/05/2023   HGB 13.7 05/05/2023   HCT 39.2 05/05/2023   MCV 92.7 05/05/2023   PLT 181 05/05/2023     Assessment/Plan:  1 Day Post-Op   - Expected postop acute blood loss anemia - Up with PT/OT - DVT ppx - SCDs, ambulation, Eliquis - WBAT operative extremity - Pain control - Discharge home when he clears PT today, needs to clear stair training - home with ice man machine  Jeffrey [name] 05/13/2023, 8:54 AM

## 2023-05-13 NOTE — Progress Notes (Signed)
 Patient verbalized understanding of dc instructions. All belongings given to patient.

## 2023-05-15 ENCOUNTER — Encounter (HOSPITAL_COMMUNITY): Payer: Self-pay | Admitting: Orthopaedic Surgery

## 2023-05-15 ENCOUNTER — Telehealth: Payer: Self-pay | Admitting: Orthopaedic Surgery

## 2023-05-15 ENCOUNTER — Other Ambulatory Visit: Payer: Self-pay | Admitting: Physician Assistant

## 2023-05-15 MED ORDER — OXYCODONE-ACETAMINOPHEN 7.5-325 MG PO TABS: 1.0000 | ORAL_TABLET | ORAL | 0 refills | Status: DC | PRN

## 2023-05-15 NOTE — Telephone Encounter (Signed)
 Patient's wife called. He is in so much pain, also he can not tolerate the CPM machine. Would like someone to call her. [phone]

## 2023-05-15 NOTE — Telephone Encounter (Signed)
 FYI  I called and advised. Per patient's wife, he has only been able to get into the CPM once today stating 12/10 pain. HHPT is coming tomorrow and is going to try and adjust CPM to see if they are able to make it more comfortable for patient. She will pick up Oxycodone 7.5/325. She is aware he should not take both at the same time.

## 2023-05-15 NOTE — Telephone Encounter (Signed)
 Can you let them know they can take the percocet every 4 hrs prn pain.  I have also sent in a new rx which is for percocet 7.5 instead of the 5.  Just make sure he is not taking both.  He can also take the muscle relaxer every 6 hours if needed.  Ice and elevate.  He can back off pt for a few days if needed, but continue cpm

## 2023-05-19 ENCOUNTER — Other Ambulatory Visit: Payer: Self-pay | Admitting: Physician Assistant

## 2023-05-19 ENCOUNTER — Telehealth: Payer: Self-pay | Admitting: Orthopaedic Surgery

## 2023-05-19 MED ORDER — OXYCODONE-ACETAMINOPHEN 7.5-325 MG PO TABS
1.0000 | ORAL_TABLET | Freq: Four times a day (QID) | ORAL | 0 refills | Status: DC | PRN
Start: 2023-05-19 — End: 2023-06-16

## 2023-05-19 NOTE — Telephone Encounter (Signed)
 Notified patient.

## 2023-05-19 NOTE — Telephone Encounter (Signed)
 Rx refill Oxycodone 7.5 mg   [facility] in [place] on [address]

## 2023-05-19 NOTE — Telephone Encounter (Signed)
 I have sent in, but to take every 6-8 hours prn instead of every 4 hours

## 2023-05-24 NOTE — Progress Notes (Unsigned)
   Post-Op Visit Note   Patient: Jeffrey [name]           Date of Birth: 19-Jun-1955           MRN: [id number] Visit Date: 05/26/2023 PCP: Lise Auer, MD   Assessment & Plan:  Chief Complaint: No chief complaint on file. Visit Diagnoses: No diagnosis found.  Plan: ***  Follow-Up Instructions: No follow-ups on file.   Orders:  No orders of the defined types were placed in this encounter. No orders of the defined types were placed in this encounter.  Imaging: No results found.  PMFS History: Patient Active Problem List   Diagnosis Date Noted  . Status post total left knee replacement 05/12/2023  . Post-traumatic osteoarthritis of left knee 01/31/2023  . Atrial fibrillation with rapid ventricular response (HCC) 11/26/2021  . 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 Occlusion and stenosis of carotid artery without mention of cerebral infarction 11/19/2013   Past Medical History:  Diagnosis Date  . Aneurysm of infrarenal abdominal aorta (HCC)   . CAD (coronary artery disease)    4 stents  . Carotid artery disease (HCC)    Left ICA  . Diabetes mellitus without complication (HCC)   . Dysrhythmia    A. Fib  . Hyperlipidemia   . Intracerebral hemorrhage (HCC) 1960  . Peripheral neuropathy   . Post-traumatic osteoarthritis of left knee     Family History  Problem Relation Age of Onset  . Vision loss Mother   . Diabetes Mother   . Crohn's disease Father     Past Surgical History:  Procedure Laterality Date  . APPENDECTOMY  1960  . I & D EXTREMITY Right 06/19/2020   Procedure: IRRIGATION AND DEBRIDEMENT LONG FINGER;  Surgeon: Betha Loa, MD;  Location: [facility] OR;  Service: Orthopedics;  Laterality: Right;  . LEFT HEART CATH  06/20/2018  . ORIF FEMUR FRACTURE  1988  . 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 Smoking status: Former    Current packs/day: 0.00    Types: Cigarettes    Quit date: 11/19/2012    Years since quitting: 10.5  . Smokeless tobacco: Never  Vaping Use  . Vaping status: Never Used  Substance and Sexual Activity  . Alcohol use: No    Alcohol/week: 0.0 standard drinks of alcohol  . Drug use: No  . Sexual activity: Not on file

## 2023-05-26 ENCOUNTER — Ambulatory Visit: Payer: No Typology Code available for payment source | Admitting: Physician Assistant

## 2023-05-26 DIAGNOSIS — Z96652 Presence of left artificial knee joint: Secondary | ICD-10-CM

## 2023-05-26 MED ORDER — OXYCODONE-ACETAMINOPHEN 5-325 MG PO TABS: 1.0000 | ORAL_TABLET | Freq: Three times a day (TID) | ORAL | 0 refills | Status: DC | PRN

## 2023-05-26 MED ORDER — METHOCARBAMOL 750 MG PO TABS: 750.0000 mg | ORAL_TABLET | Freq: Three times a day (TID) | ORAL | 2 refills | Status: AC | PRN

## 2023-06-01 ENCOUNTER — Telehealth: Payer: Self-pay | Admitting: Orthopaedic Surgery

## 2023-06-01 NOTE — Telephone Encounter (Signed)
 Pt's wife Danford Bad called requesting a call about her husband physical therapy and do they need a pre auth from [place] for therapy session. Please call pt about this matter at [phone]. Pt has surgery 3/7

## 2023-06-02 ENCOUNTER — Other Ambulatory Visit: Payer: Self-pay

## 2023-06-02 DIAGNOSIS — Z96652 Presence of left artificial knee joint: Secondary | ICD-10-CM

## 2023-06-02 NOTE — Telephone Encounter (Signed)
 Called and spoke with patient's wife. Explained that 15 visits should be approved from the [place]. She decided that she wants to come here for PT instead of [facility]. Urgent order to start PT has been entered. She will wait for a call for scheduling.

## 2023-06-06 ENCOUNTER — Ambulatory Visit (INDEPENDENT_AMBULATORY_CARE_PROVIDER_SITE_OTHER): Admitting: Physical Therapy

## 2023-06-06 ENCOUNTER — Encounter: Payer: Self-pay | Admitting: Physical Therapy

## 2023-06-06 DIAGNOSIS — M6281 Muscle weakness (generalized): Secondary | ICD-10-CM | POA: Diagnosis not present

## 2023-06-06 DIAGNOSIS — M25662 Stiffness of left knee, not elsewhere classified: Secondary | ICD-10-CM | POA: Diagnosis not present

## 2023-06-06 DIAGNOSIS — M25562 Pain in left knee: Secondary | ICD-10-CM | POA: Diagnosis not present

## 2023-06-06 DIAGNOSIS — R2689 Other abnormalities of gait and mobility: Secondary | ICD-10-CM

## 2023-06-06 DIAGNOSIS — R600 Localized edema: Secondary | ICD-10-CM

## 2023-06-06 DIAGNOSIS — G8929 Other chronic pain: Secondary | ICD-10-CM

## 2023-06-06 DIAGNOSIS — R2681 Unsteadiness on feet: Secondary | ICD-10-CM

## 2023-06-06 NOTE — Therapy (Signed)
 OUTPATIENT PHYSICAL THERAPY LOWER EXTREMITY EVALUATION   Patient Name: ANFERNEE PESCHKE MRN: [id number] DOB:Apr 02, 1955, 68 y.o., male Today's Date: 06/06/2023  END OF SESSION:  PT End of Session - 06/06/23 1500     Visit Number 1    Number of Visits 15    Date for PT Re-Evaluation 08/03/23    [place]ization Type [facility]    Authorization Time Period 15 visits  2/14-8/13/25    Authorization - Visit Number 1    Authorization - Number of Visits 15    Progress Note Due on Visit 10    PT Start Time 1345    PT Stop Time 1430    PT Time Calculation (min) 45 min    Activity Tolerance Patient tolerated treatment well;Patient limited by pain    Behavior During Therapy WFL for tasks assessed/performed             Past Medical History:  Diagnosis Date   Aneurysm of infrarenal abdominal aorta (HCC)    CAD (coronary artery disease)    4 stents   Carotid artery disease (HCC)    Left ICA   Diabetes mellitus without complication (HCC)    Dysrhythmia    A. Fib   Hyperlipidemia    Intracerebral hemorrhage (HCC) 1960   Peripheral neuropathy    Post-traumatic osteoarthritis of left knee    Past Surgical History:  Procedure Laterality Date   APPENDECTOMY  1960   I & D EXTREMITY Right 06/19/2020   Procedure: IRRIGATION AND DEBRIDEMENT LONG FINGER;  Surgeon: Betha Loa, MD;  Location: [facility] OR;  Service: Orthopedics;  Laterality: Right;   LEFT HEART CATH  06/20/2018   ORIF FEMUR FRACTURE  1988   TOTAL KNEE ARTHROPLASTY Left 05/12/2023   Procedure: LEFT TOTAL KNEE ARTHROPLASTY;  Surgeon: Tarry Kos, MD;  Location: [facility] OR;  Service: Orthopedics;  Laterality: Left;   VARICOSE VEIN SURGERY Right    right leg   Patient Active Problem List   Diagnosis Date Noted   Status post total left knee replacement 05/12/2023   Post-traumatic osteoarthritis of left knee 01/31/2023   Atrial fibrillation with rapid ventricular response (HCC) 11/26/2021   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   Occlusion  and stenosis of carotid artery without mention of cerebral infarction 11/19/2013    PCP: Lise Auer, MD  REFERRING PROVIDER: Tarry Kos, MD  REFERRING DIAG: [phone] (ICD-10-CM) - H/O total knee replacement, left   THERAPY DIAG:  Chronic pain of left knee  Muscle weakness (generalized)  Stiffness of left knee, not elsewhere classified  Other abnormalities of gait and mobility  Unsteadiness on feet  Localized edema  Rationale for Evaluation and Treatment: Rehabilitation  ONSET DATE: 05/12/2023  Left TKA  SUBJECTIVE:   SUBJECTIVE STATEMENT: This 68 patient underwent a left Total Knee Arthroplasty on 05/12/2023 due to post traumatic arthritis.  He had HHPT until 05/24/2023. CPM 2x/day for 1 hour 0*-90*.   [facility]  [place] [facility] [place] visits.   PERTINENT HISTORY: Left TKA 05/12/23, OA Left femur fx (shaft just proximal to distal plate) ORIF [id number] & removal 01/05/22, A-Fib, CAD 4 stents, carotid artery disease, DM, HTN, HLD, Depression, aneurysm of infrarenal abdominal aorta, peripheral neuropathy  PAIN:  NPRS scale: today 4/10 (took pain pill 30 min ago) and over the last week lowest 2/10 & highest 9/10 Pain location: left knee more on medial / lateral joint line.  Pain description: dull ache, sore Aggravating factors: bend it, pants or something rubbing over it Relieving  factors:  rubbing, pain meds, ice, elevation  PRECAUTIONS: None  WEIGHT BEARING RESTRICTIONS: No  FALLS:  Has patient fallen in last 6 months? No  LIVING ENVIRONMENT: Lives with: lives with their spouse and dog 55#   [place]ives [place] farm.  Tractor climb.  Lives in: House Stairs: Yes:  External: 5 steps; 2 rails can reach both. Has following equipment at home: Single point cane, Walker - 2 wheeled, Wheelchair (manual), [employer], and Grab bars  OCCUPATION: retired.  Works on farm. Climb tractor.  Handyman work.  PLOF: Independent  PATIENT GOALS:   work on his farm.  Walk around. Travel with RV (bumper pull)  needs to set up.    Next MD visit: 06/27/2023  OBJECTIVE:  DIAGNOSTIC FINDINGS: 05/12/23 X-ray following TKA no complications.  Patient-Specific Activity Scoring Scheme  "0" represents "unable to perform." "10" represents "able to perform at prior level. 0 1 2 3 4 5 6 7 8 9  10 (Date and Score)   Activity Eval     1. Climb on tractor  0    2. Walk on paved surfaces 2     3. Walk on uneven like farm  2   4. Climb stairs  2   5. Carry items & standing ADLs 2   Score 1.6    Total score = sum of the activity scores/number of activities Minimum detectable change (90%CI) for average score = 2 points Minimum detectable change (90%CI) for single activity score = 3 points  COGNITION: Overall cognitive status: WFL    SENSATION: WFL  EDEMA:  Circumferential:   LLE: above knee 47.8cm,  around knee 45.5 cm, below knee 39.6 cm RLE: above knee 43.1 cm,  around knee 41.6 cm, below knee 35.4 cm  POSTURE: rounded shoulders, forward head, flexed trunk , and weight shift right  PALPATION: Tenderness around left knee joint line, quadriceps tendon and patella tendon.   Scar has decreased mobility especially over patella and tibia.   LOWER EXTREMITY ROM:   ROM Right eval Left eval  Hip flexion    Hip extension    Hip abduction    Hip adduction    Hip internal rotation    Hip external rotation    Knee flexion  Seated A: 94* Supine A: 95* P: 99*  Knee extension  Seated  LAQ -30* Supine  Quad set -9* P: -7*  Ankle dorsiflexion    Ankle plantarflexion    Ankle inversion    Ankle eversion     (Blank rows = not tested)  LOWER EXTREMITY MMT:  MMT Right eval Left eval  Hip flexion    Hip extension    Hip abduction    Hip adduction    Hip internal rotation    Hip external rotation    Knee flexion  3-/5  Knee extension  3-/5  Ankle dorsiflexion    Ankle plantarflexion    Ankle inversion    Ankle eversion     (Blank rows = not tested)  FUNCTIONAL TESTS:  18 inch  chair transfer: heavy use of BUEs on armrests with LLE decreased use.  GAIT: Distance walked: 100' Assistive device utilized: Walker - 2 wheeled Level of assistance: SBA / cues for deviations.  No balance issue with RW support.  Comments: excessive BUE weight bearing on RW,  left knee flexed in stance, minimal knee flexion and terminal stance and swing.    TODAY'S TREATMENT  DATE: 06/06/2023 Therex:    HEP instruction/performance c cues for techniques, handout provided.  Trial set performed of each for comprehension and symptom assessment.  See below for exercise list  PATIENT EDUCATION:  Education details: HEP, POC Person educated: Patient Education method: Explanation, Demonstration, Verbal cues, and Handouts Education comprehension: verbalized understanding, returned demonstration, and verbal cues required  HOME EXERCISE PROGRAM: Access Code: [email or web address] URL: [email or web address] Date: 06/06/2023 Prepared by: Vladimir Faster  Exercises - Ankle Alphabet in Elevation  - 2-4 x daily - 7 x weekly - 1 sets - 1 reps - Quad Setting and Stretching  - 2-4 x daily - 7 x weekly - 5-10 sets - 10 reps - prop 5-10 minutes & quad set5 seconds hold - Supine Heel Slide with Strap  - 2-3 x daily - 7 x weekly - 2-3 sets - 10 reps - 5 seconds hold - Supine Straight Leg Raises  - 2-3 x daily - 7 x weekly - 2-3 sets - 10 reps - 5 seconds hold - Seated Knee Flexion Extension AROM   - 2-4 x daily - 7 x weekly - 2-3 sets - 10 reps - 5 seconds hold - Seated straight leg lifts  - 2-3 x daily - 7 x weekly - 2-3 sets - 10 reps - 5 seconds hold  ASSESSMENT: CLINICAL IMPRESSION: Patient is a 68 y.o. who comes to clinic with complaints of left knee pain s/p TKA with mobility, strength and movement coordination deficits that impair their ability to perform usual daily and recreational functional activities without increase  difficulty/symptoms at this time.  Patient to benefit from skilled PT services to address impairments and limitations to improve to previous level of function without restriction secondary to condition.   OBJECTIVE IMPAIRMENTS: Abnormal gait, decreased activity tolerance, decreased balance, decreased endurance, decreased knowledge of condition, decreased knowledge of use of DME, decreased mobility, difficulty walking, decreased ROM, decreased strength, increased edema, postural dysfunction, and pain.   ACTIVITY LIMITATIONS: carrying, lifting, bending, standing, squatting, sleeping, stairs, transfers, locomotion level, and climbing on farm equipment  PARTICIPATION LIMITATIONS: meal prep, cleaning, laundry, driving, community activity, occupation, yard work, and travel  PERSONAL FACTORS: Fitness, Past/current experiences, Time since onset of injury/illness/exacerbation, and 3+ comorbidities: see PMH  are also affecting patient's functional outcome.   REHAB POTENTIAL: Good  CLINICAL DECISION MAKING: Stable/uncomplicated  EVALUATION COMPLEXITY: Low   GOALS: Goals reviewed with patient? Yes  SHORT TERM GOALS: (target date for Short term goals 07/06/2023)   1.  Patient will demonstrate independent use of home exercise program to maintain progress from in clinic treatments. Baseline: See objective data Goal status: Initial  2. PROM 0* ext to 110* flexion Baseline: See objective data Goal status: Initial  3. Interim PSFS average >4/10 Baseline: See objective data Goal status: Initial  LONG TERM GOALS: (target dates for all long term goals 08/02/2023   )   1. Patient will demonstrate/report pain at worst less than or equal to 2/10 to facilitate minimal limitation in daily activity secondary to pain symptoms. Baseline: See objective data Goal status: Initial   2. Patient will demonstrate independent use of home exercise program to facilitate ability to maintain/progress functional gains  from skilled physical therapy services. Baseline: See objective data Goal status: Initial  3.  Patient reports Patient-Specific Activity Score improved the average by 5 to indicate improvement in functional activities.  Baseline: SEE OBJECTIVE DATA Goal status: INITIAL   4.  Patient will demonstrate LLE MMT 5/5 throughout to faciltiate usual  transfers, stairs, squatting at [facility] for daily life.  Baseline: See objective data Goal status: Initial   5.  Patient left knee AROM 0* ext to 110* flexion.  Baseline: See objective data Goal status: Initial   6. Left knee PROM 0* to 120* Baseline: See objective data Goal status: Initial   7.  patient ambulates >500', neg ramps, curbs and stairs 1 rail independently.  Baseline: See objective data Goal status: Initial  PLAN:  PT FREQUENCY:  2x/week  PT DURATION: 8 weeks  PLANNED INTERVENTIONS: 97164- PT Re-evaluation, 97110-Therapeutic exercises, 97530- Therapeutic activity, 97112- Neuromuscular re-education, 97535- Self Care, 16109- Manual therapy, [phone]- Gait training, [phone]- Aquatic Therapy, [phone]- Electrical stimulation (unattended), [phone]- Electrical stimulation (manual), 97016- Vasopneumatic device, Patient/Family education, Balance training, Stair training, Taping, Dry Needling, Joint mobilization, Scar mobilization, Vestibular training, DME instructions, Cryotherapy, Moist heat, and physical performance testing.   PLAN FOR NEXT SESSION: Review & Update HEP. Therapeutic exercise and manual therapy with range focus.  Vaso of edema.     Vladimir Faster, PT, DPT 06/06/2023, 5:08 PM

## 2023-06-08 ENCOUNTER — Ambulatory Visit (INDEPENDENT_AMBULATORY_CARE_PROVIDER_SITE_OTHER): Admitting: Physical Therapy

## 2023-06-08 ENCOUNTER — Encounter: Payer: Self-pay | Admitting: Physical Therapy

## 2023-06-08 DIAGNOSIS — M25562 Pain in left knee: Secondary | ICD-10-CM

## 2023-06-08 DIAGNOSIS — M25662 Stiffness of left knee, not elsewhere classified: Secondary | ICD-10-CM | POA: Diagnosis not present

## 2023-06-08 DIAGNOSIS — R600 Localized edema: Secondary | ICD-10-CM

## 2023-06-08 DIAGNOSIS — R2689 Other abnormalities of gait and mobility: Secondary | ICD-10-CM | POA: Diagnosis not present

## 2023-06-08 DIAGNOSIS — G8929 Other chronic pain: Secondary | ICD-10-CM

## 2023-06-08 DIAGNOSIS — M6281 Muscle weakness (generalized): Secondary | ICD-10-CM

## 2023-06-08 DIAGNOSIS — R2681 Unsteadiness on feet: Secondary | ICD-10-CM

## 2023-06-08 NOTE — Therapy (Signed)
 OUTPATIENT PHYSICAL THERAPY LOWER EXTREMITY TREATMENT   Patient Name: Jeffrey [name] MRN: [id number] DOB:19-Dec-1955, 68 y.o., male Today's Date: 06/08/2023  END OF SESSION:  PT End of Session - 06/08/23 1149     Visit Number 2    Number of Visits 15    Date for PT Re-Evaluation 08/03/23    Authorization Type [facility]    Authorization Time Period 15 visits  2/14-8/13/25    Authorization - Visit Number 2    Authorization - Number of Visits 15    Progress Note Due on Visit 10    PT Start Time 1145    PT Stop Time 1235    PT Time Calculation (min) 50 min    Activity Tolerance Patient tolerated treatment well;Patient limited by pain    Behavior During Therapy WFL for tasks assessed/performed              Past Medical History:  Diagnosis Date   Aneurysm of infrarenal abdominal aorta (HCC)    CAD (coronary artery disease)    4 stents   Carotid artery disease (HCC)    Left ICA   Diabetes mellitus without complication (HCC)    Dysrhythmia    A. Fib   Hyperlipidemia    Intracerebral hemorrhage (HCC) 1960   Peripheral neuropathy    Post-traumatic osteoarthritis of left knee    Past Surgical History:  Procedure Laterality Date   APPENDECTOMY  1960   I & D EXTREMITY Right 06/19/2020   Procedure: IRRIGATION AND DEBRIDEMENT LONG FINGER;  Surgeon: Betha Loa, MD;  Location: [facility] OR;  Service: Orthopedics;  Laterality: Right;   LEFT HEART CATH  06/20/2018   ORIF FEMUR FRACTURE  1988   TOTAL KNEE ARTHROPLASTY Left 05/12/2023   Procedure: LEFT TOTAL KNEE ARTHROPLASTY;  Surgeon: Tarry Kos, MD;  Location: [facility] OR;  Service: Orthopedics;  Laterality: Left;   VARICOSE VEIN SURGERY Right    right leg   Patient Active Problem List   Diagnosis Date Noted   Status post total left knee replacement 05/12/2023   Post-traumatic osteoarthritis of left knee 01/31/2023   Atrial fibrillation with rapid ventricular response (HCC) 11/26/2021   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   Occlusion  and stenosis of carotid artery without mention of cerebral infarction 11/19/2013    PCP: Lise Auer, MD  REFERRING PROVIDER: Tarry Kos, MD  REFERRING DIAG: [id number] (ICD-10-CM) - H/O total knee replacement, left   THERAPY DIAG:  Chronic pain of left knee  Muscle weakness (generalized)  Stiffness of left knee, not elsewhere classified  Other abnormalities of gait and mobility  Unsteadiness on feet  Localized edema  Rationale for Evaluation and Treatment: Rehabilitation  ONSET DATE: 05/12/2023  Left TKA  SUBJECTIVE:   SUBJECTIVE STATEMENT: He has been doing his exercises 3x/day.  He is only sleeping 2-3 hours.  Starts on back then rolls to left side.  Normally sleeps on sides changing during night.   PERTINENT HISTORY: Left TKA 05/12/23, OA Left femur fx (shaft just proximal to distal plate) ORIF 5366 & removal 01/05/22, A-Fib, CAD 4 stents, carotid artery disease, DM, HTN, HLD, Depression, aneurysm of infrarenal abdominal aorta, peripheral neuropathy  PAIN:  NPRS scale: today 7/10 (took pain pill 2 hr 30 min ago) and over the last week lowest 2/10 & highest 9/10 Pain location: left knee more on medial / lateral joint line.  Pain description: dull ache, sore Aggravating factors: bend it, pants or something rubbing over it Relieving factors:  rubbing,  pain meds, ice, elevation  PRECAUTIONS: None  WEIGHT BEARING RESTRICTIONS: No  FALLS:  Has patient fallen in last 6 months? No  LIVING ENVIRONMENT: Lives with: lives with their spouse and dog 55#   lives [place] farm.  Tractor climb.  Lives in: House Stairs: Yes:  External: 5 steps; 2 rails can reach both. Has following equipment at home: Single point cane, Walker - 2 wheeled, Wheelchair (manual), [employer], and Grab bars  OCCUPATION: retired.  Works on farm. Climb tractor.  Handyman work.  PLOF: Independent  PATIENT GOALS:   work on his farm.  Walk around. Travel with RV (bumper pull) needs to set up.    Next MD  visit: 06/27/2023  OBJECTIVE:  DIAGNOSTIC FINDINGS: 05/12/23 X-ray following TKA no complications.  Patient-Specific Activity Scoring Scheme  "0" represents "unable to perform." "10" represents "able to perform at prior level. 0 1 2 3 4 5 6 7 8 9  10 (Date and Score)   Activity Eval     1. Climb on tractor  0    2. Walk on paved surfaces 2     3. Walk on uneven like farm  2   4. Climb stairs  2   5. Carry items & standing ADLs 2   Score 1.6    Total score = sum of the activity scores/number of activities Minimum detectable change (90%CI) for average score = 2 points Minimum detectable change (90%CI) for single activity score = 3 points  COGNITION: Overall cognitive status: WFL    SENSATION: WFL  EDEMA:  Circumferential:   LLE: above knee 47.8cm,  around knee 45.5 cm, below knee 39.6 cm RLE: above knee 43.1 cm,  around knee 41.6 cm, below knee 35.4 cm  POSTURE: rounded shoulders, forward head, flexed trunk , and weight shift right  PALPATION: Tenderness around left knee joint line, quadriceps tendon and patella tendon.   Scar has decreased mobility especially over patella and tibia.   LOWER EXTREMITY ROM:   ROM Left eval  Hip flexion   Hip extension   Hip abduction   Hip adduction   Hip internal rotation   Hip external rotation   Knee flexion Seated A: 94* Supine A: 95* P: 99*  Knee extension Seated  LAQ -30* Supine  Quad set -9* P: -7*  Ankle dorsiflexion   Ankle plantarflexion   Ankle inversion   Ankle eversion    (Blank rows = not tested)  LOWER EXTREMITY MMT:  MMT Left eval  Hip flexion   Hip extension   Hip abduction   Hip adduction   Hip internal rotation   Hip external rotation   Knee flexion 3-/5  Knee extension 3-/5  Ankle dorsiflexion   Ankle plantarflexion   Ankle inversion   Ankle eversion    (Blank rows = not tested)  FUNCTIONAL TESTS:  18 inch chair transfer: heavy use of BUEs on armrests with LLE decreased  use.  GAIT: Distance walked: 100' Assistive device utilized: Walker - 2 wheeled Level of assistance: SBA / cues for deviations.  No balance issue with RW support.  Comments: excessive BUE weight bearing on RW,  left knee flexed in stance, minimal knee flexion and terminal stance and swing.    TODAY'S TREATMENT  DATE: 06/08/2023: Therapeutic Exercise: Precor recumbent bike seat 9: rocking for flexion stretch for 2.5 minutes; then backwards full revolutions for 4.5 min; forward full revolutions 1 min.  Gastroc stretch on incline board 30 sec 2 reps Seated LLE LAQ 10 reps 2 sets Supine heel slide with strap & 12" ball 10 reps.  PT cued on HEP exercise.  Pt & wife verbalized an understanding.   Self-Care: Pt demo & verbal cues on weight shift onto LLE for 3 sec 5 reps prior to walking.  Pt return demo & verbalized understanding. PT demo & verbal cues on positioning supine & sidelying "tenting" sheets off feet and between knees.  Pt & wife verbalized understanding.  PT demo & verbal cues on scar mobs.  Pt verbalized understanding.   PT used model to explain the TKA surgery.   Manual Therapy: PROM with overpressure traction with tibial int rot.   Supine grade IV mob for ext.    Vaso Left knee with elevation & ext stretch 34* medium compression for 10 min.     TREATMENT                                                                          DATE: 06/06/2023 Therex:    HEP instruction/performance c cues for techniques, handout provided.  Trial set performed of each for comprehension and symptom assessment.  See below for exercise list  PATIENT EDUCATION:  Education details: HEP, POC Person educated: Patient Education method: Explanation, Demonstration, Verbal cues, and Handouts Education comprehension: verbalized understanding, returned demonstration, and verbal cues required  HOME EXERCISE PROGRAM: Access Code:  [email or web address] URL: [email or web address] Date: 06/06/2023 Prepared by: Vladimir Faster  Exercises - Ankle Alphabet in Elevation  - 2-4 x daily - 7 x weekly - 1 sets - 1 reps - Quad Setting and Stretching  - 2-4 x daily - 7 x weekly - 5-10 sets - 10 reps - prop 5-10 minutes & quad set5 seconds hold - Supine Heel Slide with Strap  - 2-3 x daily - 7 x weekly - 2-3 sets - 10 reps - 5 seconds hold - Supine Straight Leg Raises  - 2-3 x daily - 7 x weekly - 2-3 sets - 10 reps - 5 seconds hold - Seated Knee Flexion Extension AROM   - 2-4 x daily - 7 x weekly - 2-3 sets - 10 reps - 5 seconds hold - Seated straight leg lifts  - 2-3 x daily - 7 x weekly - 2-3 sets - 10 reps - 5 seconds hold  ASSESSMENT: CLINICAL IMPRESSION: Patient appears to understand self-care instructions which should improve his comfort and mobility.  He was able to progress range with manual therapy and therapeutic exercises.    OBJECTIVE IMPAIRMENTS: Abnormal gait, decreased activity tolerance, decreased balance, decreased endurance, decreased knowledge of condition, decreased knowledge of use of DME, decreased mobility, difficulty walking, decreased ROM, decreased strength, increased edema, postural dysfunction, and pain.   ACTIVITY LIMITATIONS: carrying, lifting, bending, standing, squatting, sleeping, stairs, transfers, locomotion level, and climbing on farm equipment  PARTICIPATION LIMITATIONS: meal prep, cleaning, laundry, driving, community activity, occupation, yard work, and travel  PERSONAL FACTORS: Fitness, Past/current experiences, Time since onset of injury/illness/exacerbation, and 3+ comorbidities: see  PMH  are also affecting patient's functional outcome.   REHAB POTENTIAL: Good  CLINICAL DECISION MAKING: Stable/uncomplicated  EVALUATION COMPLEXITY: Low   GOALS: Goals reviewed with patient? Yes  SHORT TERM GOALS: (target date for Short term goals 07/06/2023)   1.  Patient will demonstrate  independent use of home exercise program to maintain progress from in clinic treatments. Baseline: See objective data Goal status: Ongoing  06/08/2023  2. PROM 0* ext to 110* flexion Baseline: See objective data Goal status: Ongoing  06/08/2023  3. Interim PSFS average >4/10 Baseline: See objective data Goal status: Ongoing  06/08/2023  LONG TERM GOALS: (target dates for all long term goals 08/02/2023   )   1. Patient will demonstrate/report pain at worst less than or equal to 2/10 to facilitate minimal limitation in daily activity secondary to pain symptoms. Baseline: See objective data Goal status: Ongoing  06/08/2023   2. Patient will demonstrate independent use of home exercise program to facilitate ability to maintain/progress functional gains from skilled physical therapy services. Baseline: See objective data Goal status: Ongoing  06/08/2023  3.  Patient reports Patient-Specific Activity Score improved the average by 5 to indicate improvement in functional activities.  Baseline: SEE OBJECTIVE DATA Goal status: Ongoing  06/08/2023   4.  Patient will demonstrate LLE MMT 5/5 throughout to faciltiate usual transfers, stairs, squatting at [facility] for daily life.  Baseline: See objective data Goal status: Ongoing  06/08/2023   5.  Patient left knee AROM 0* ext to 110* flexion.  Baseline: See objective data Goal status: Ongoing  06/08/2023   6. Left knee PROM 0* to 120* Baseline: See objective data Goal status: Ongoing  06/08/2023   7.  patient ambulates >500', neg ramps, curbs and stairs 1 rail independently.  Baseline: See objective data Goal status: Ongoing  06/08/2023  PLAN:  PT FREQUENCY:  2x/week  PT DURATION: 8 weeks  PLANNED INTERVENTIONS: 97164- PT Re-evaluation, 97110-Therapeutic exercises, 97530- Therapeutic activity, 97112- Neuromuscular re-education, 97535- Self Care, 98119- Manual therapy, [phone]- Gait training, [phone]- Aquatic Therapy, [phone]- Electrical stimulation (unattended),  [phone]- Electrical stimulation (manual), 97016- Vasopneumatic device, Patient/Family education, Balance training, Stair training, Taping, Dry Needling, Joint mobilization, Scar mobilization, Vestibular training, DME instructions, Cryotherapy, Moist heat, and physical performance testing.   PLAN FOR NEXT SESSION: Therapeutic exercise and manual therapy with range focus.  Add leg press.  Vaso of edema.     Vladimir Faster, PT, DPT 06/08/2023, 12:59 PM

## 2023-06-12 ENCOUNTER — Encounter: Payer: Self-pay | Admitting: Rehabilitative and Restorative Service Providers"

## 2023-06-12 ENCOUNTER — Ambulatory Visit (INDEPENDENT_AMBULATORY_CARE_PROVIDER_SITE_OTHER): Admitting: Rehabilitative and Restorative Service Providers"

## 2023-06-12 DIAGNOSIS — M6281 Muscle weakness (generalized): Secondary | ICD-10-CM

## 2023-06-12 DIAGNOSIS — R2681 Unsteadiness on feet: Secondary | ICD-10-CM

## 2023-06-12 DIAGNOSIS — R2689 Other abnormalities of gait and mobility: Secondary | ICD-10-CM | POA: Diagnosis not present

## 2023-06-12 DIAGNOSIS — M25662 Stiffness of left knee, not elsewhere classified: Secondary | ICD-10-CM | POA: Diagnosis not present

## 2023-06-12 DIAGNOSIS — M25562 Pain in left knee: Secondary | ICD-10-CM | POA: Diagnosis not present

## 2023-06-12 DIAGNOSIS — R600 Localized edema: Secondary | ICD-10-CM

## 2023-06-12 DIAGNOSIS — G8929 Other chronic pain: Secondary | ICD-10-CM

## 2023-06-12 NOTE — Therapy (Signed)
 OUTPATIENT PHYSICAL THERAPY  TREATMENT   Patient Name: NICKOLAOS BRALLIER MRN: [id number] DOB:[birth year], 68 y.o., male Today's Date: 06/12/2023  END OF SESSION:  PT End of Session - 06/12/23 1009     Visit Number 3    Number of Visits 15    Date for PT Re-Evaluation 08/03/23    Authorization Type [facility]    Authorization Time Period 15 visits  2/14-8/13/25    Authorization - Visit Number 3    Authorization - Number of Visits 15    Progress Note Due on Visit 10    PT Start Time 1006    PT Stop Time 1105    PT Time Calculation (min) 59 min    Activity Tolerance Patient tolerated treatment well    Behavior During Therapy WFL for tasks assessed/performed               Past Medical History:  Diagnosis Date   Aneurysm of infrarenal abdominal aorta (HCC)    CAD (coronary artery disease)    4 stents   Carotid artery disease (HCC)    Left ICA   Diabetes mellitus without complication (HCC)    Dysrhythmia    A. Fib   Hyperlipidemia    Intracerebral hemorrhage (HCC) 1960   Peripheral neuropathy    Post-traumatic osteoarthritis of left knee    Past Surgical History:  Procedure Laterality Date   APPENDECTOMY  1960   I & D EXTREMITY Right 06/19/2020   Procedure: IRRIGATION AND DEBRIDEMENT LONG FINGER;  Surgeon: Betha Loa, MD;  Location: [facility] OR;  Service: Orthopedics;  Laterality: Right;   LEFT HEART CATH  06/20/2018   ORIF FEMUR FRACTURE  1988   TOTAL KNEE ARTHROPLASTY Left 05/12/2023   Procedure: LEFT TOTAL KNEE ARTHROPLASTY;  Surgeon: Tarry Kos, MD;  Location: [facility] OR;  Service: Orthopedics;  Laterality: Left;   VARICOSE VEIN SURGERY Right    right leg   Patient Active Problem List   Diagnosis Date Noted   Status post total left knee replacement 05/12/2023   Post-traumatic osteoarthritis of left knee 01/31/2023   Atrial fibrillation with rapid ventricular response (HCC) 11/26/2021   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   Occlusion and stenosis of carotid artery  without mention of cerebral infarction 11/19/2013    PCP: Lise Auer, MD  REFERRING PROVIDER: Tarry Kos, MD  REFERRING DIAG: [phone] (ICD-10-CM) - H/O total knee replacement, left   THERAPY DIAG:  Chronic pain of left knee  Muscle weakness (generalized)  Stiffness of left knee, not elsewhere classified  Other abnormalities of gait and mobility  Unsteadiness on feet  Localized edema  Rationale for Evaluation and Treatment: Rehabilitation  ONSET DATE: 05/12/2023  Left TKA  SUBJECTIVE:   SUBJECTIVE STATEMENT: Pt indicated feeling trouble sleeping and feeling tired.  Reported 6/10 upon arrival.  Reported feeling knee kick backwards sometimes in walking.  Reported using cane in kitchen sometimes.   PERTINENT HISTORY: Left TKA 05/12/23, OA Left femur fx (shaft just proximal to distal plate) ORIF 2956 & removal 01/05/22, A-Fib, CAD 4 stents, carotid artery disease, DM, HTN, HLD, Depression, aneurysm of infrarenal abdominal aorta, peripheral neuropathy  PAIN:  NPRS scale: about 6/10 upon arrival.  Pain location: left knee more on medial / lateral joint line.  Pain description: dull ache, sore Aggravating factors: bend it, pants or something rubbing over it Relieving factors:  rubbing, pain meds, ice, elevation  PRECAUTIONS: None  WEIGHT BEARING RESTRICTIONS: No  FALLS:  Has patient fallen in last  6 months? No  LIVING ENVIRONMENT: Lives with: lives with their spouse and dog 55#   [place]ives [place] farm.  Tractor climb.  Lives in: House Stairs: Yes:  External: 5 steps; 2 rails can reach both. Has following equipment at home: Single point cane, Walker - 2 wheeled, Wheelchair (manual), [employer], and Grab bars  OCCUPATION: retired.  Works on farm. Climb tractor.  Handyman work.  PLOF: Independent  PATIENT GOALS:   work on his farm.  Walk around. Travel with RV (bumper pull) needs to set up.    Next MD visit: 06/27/2023  OBJECTIVE:  DIAGNOSTIC FINDINGS: 05/12/23 X-ray  following TKA no complications.  Patient-Specific Activity Scoring Scheme  "0" represents "unable to perform." "10" represents "able to perform at prior level. 0 1 2 3 4 5 6 7 8 9  10 (Date and Score)   Activity Eval  06/06/2023    1. Climb on tractor  0    2. Walk on paved surfaces 2     3. Walk on uneven like farm  2   4. Climb stairs  2   5. Carry items & standing ADLs 2   Score 1.6    Total score = sum of the activity scores/number of activities Minimum detectable change (90%CI) for average score = 2 points Minimum detectable change (90%CI) for single activity score = 3 points  COGNITION: 06/06/2023 Overall cognitive status: WFL    SENSATION: 06/06/2023 WFL  EDEMA:  Circumferential:   LLE: above knee 47.8cm,  around knee 45.5 cm, below knee 39.6 cm RLE: above knee 43.1 cm,  around knee 41.6 cm, below knee 35.4 cm  POSTURE: 06/06/2023   rounded shoulders, forward head, flexed trunk , and weight shift right  PALPATION: 06/06/2023 Tenderness around left knee joint line, quadriceps tendon and patella tendon.   Scar has decreased mobility especially over patella and tibia.   LOWER EXTREMITY ROM:   ROM Left Eval 06/06/2023 Left 06/12/2023  Hip flexion    Hip extension    Hip abduction    Hip adduction    Hip internal rotation    Hip external rotation    Knee flexion Seated A: 94* Supine A: 95* P: 99* Supine AROM heel slide  109  Knee extension Seated  LAQ -30* Supine  Quad set -9* P: -7* -3 in supine quad set  Ankle dorsiflexion    Ankle plantarflexion    Ankle inversion    Ankle eversion     (Blank rows = not tested)  LOWER EXTREMITY MMT:  MMT Left Eval 06/06/2023  Hip flexion   Hip extension   Hip abduction   Hip adduction   Hip internal rotation   Hip external rotation   Knee flexion 3-/5  Knee extension 3-/5  Ankle dorsiflexion   Ankle plantarflexion   Ankle inversion   Ankle eversion    (Blank rows = not tested)  FUNCTIONAL  TESTS:  06/06/2023 18 inch chair transfer: heavy use of BUEs on armrests with LLE decreased use.  GAIT: 06/06/2023 Distance walked: 100' Assistive device utilized: Environmental consultant - 2 wheeled Level of assistance: SBA / cues for deviations.  No balance issue with RW support.  Comments: excessive BUE weight bearing on RW,  left knee flexed in stance, minimal knee flexion and terminal stance and swing.                    TODAY'S TREATMENT  DATE:  06/12/2023 Therex: Nustep lvl 5 10 mins UE/LE for ROM Incline gastroc stretch 30 sec x 3 bilateral  Seated Lt leg LAQ 2 lbs with end range pause each direction 2 x 15 Supine AROM heel slide 5 sec hold x 10 Lt leg   Supine heel prop education and performance at start of vaso with focus on extension gains.   Performed to tolerance 3 mins . Additional time spent in general review of existing HEP.   Neuro Re-ed (to improve neural muscular recruitment, balance/control) Tandem stance in // bars c occasional HHA 1 min x 2 bilateral  Standing on foam anterior/posterior weight shifting x 20 with occasional HHA on // bars, SBA Supine quad set 5 sec hold Lt leg (performed in combo with heel slide, alternating)  TherActivity (to improve stairs, squatting, transfers) Leg press double leg 75 lbs x 15 slow movement focus, Lt leg x 15 31 lbs  Sit to stand to sit 20 inch table  height x 5 s UE assist     Vaso: 10 mins Lt knee 34 deg medium compression  - First 5 mins with heel prop to tolerance and education in HEP.   TODAY'S TREATMENT                                                                          DATE:06/08/2023: Therapeutic Exercise: Precor recumbent bike seat 9: rocking for flexion stretch for 2.5 minutes; then backwards full revolutions for 4.5 min; forward full revolutions 1 min.  Gastroc stretch on incline board 30 sec 2 reps Seated LLE LAQ 10 reps 2 sets Supine heel slide with  strap & 12" ball 10 reps.  PT cued on HEP exercise.  Pt & wife verbalized an understanding.   Self-Care: Pt demo & verbal cues on weight shift onto LLE for 3 sec 5 reps prior to walking.  Pt return demo & verbalized understanding. PT demo & verbal cues on positioning supine & sidelying "tenting" sheets off feet and between knees.  Pt & wife verbalized understanding.  PT demo & verbal cues on scar mobs.  Pt verbalized understanding.   PT used model to explain the TKA surgery.   Manual Therapy: PROM with overpressure traction with tibial int rot.   Supine grade IV mob for ext.    Vaso Left knee with elevation & ext stretch 34* medium compression for 10 min.     TREATMENT                                                                          DATE:06/06/2023 Therex:    HEP instruction/performance c cues for techniques, handout provided.  Trial set performed of each for comprehension and symptom assessment.  See below for exercise list  PATIENT EDUCATION:  Education details: HEP, POC Person educated: Patient Education method: Explanation, Demonstration, Verbal cues, and Handouts Education comprehension: verbalized understanding, returned demonstration, and verbal cues required  HOME EXERCISE PROGRAM: Access  Code: [email or web address] URL: [email or web address] Date: 06/06/2023 Prepared by: Vladimir Faster  Exercises - Ankle Alphabet in Elevation  - 2-4 x daily - 7 x weekly - 1 sets - 1 reps - Quad Setting and Stretching  - 2-4 x daily - 7 x weekly - 5-10 sets - 10 reps - prop 5-10 minutes & quad set5 seconds hold - Supine Heel Slide with Strap  - 2-3 x daily - 7 x weekly - 2-3 sets - 10 reps - 5 seconds hold - Supine Straight Leg Raises  - 2-3 x daily - 7 x weekly - 2-3 sets - 10 reps - 5 seconds hold - Seated Knee Flexion Extension AROM   - 2-4 x daily - 7 x weekly - 2-3 sets - 10 reps - 5 seconds hold - Seated straight leg lifts  - 2-3 x daily - 7 x weekly - 2-3 sets - 10 reps - 5  seconds hold  ASSESSMENT: CLINICAL IMPRESSION: General mobility of Lt leg continued to show improvement.  Strengthening and balance improvement to continue to help functional movement ability.  Continued skilled PT services indicated at this time.   OBJECTIVE IMPAIRMENTS: Abnormal gait, decreased activity tolerance, decreased balance, decreased endurance, decreased knowledge of condition, decreased knowledge of use of DME, decreased mobility, difficulty walking, decreased ROM, decreased strength, increased edema, postural dysfunction, and pain.   ACTIVITY LIMITATIONS: carrying, lifting, bending, standing, squatting, sleeping, stairs, transfers, locomotion level, and climbing on farm equipment  PARTICIPATION LIMITATIONS: meal prep, cleaning, laundry, driving, community activity, occupation, yard work, and travel  PERSONAL FACTORS: Fitness, Past/current experiences, Time since onset of injury/illness/exacerbation, and 3+ comorbidities: see PMH  are also affecting patient's functional outcome.   REHAB POTENTIAL: Good  CLINICAL DECISION MAKING: Stable/uncomplicated  EVALUATION COMPLEXITY: Low   GOALS: Goals reviewed with patient? Yes  SHORT TERM GOALS: (target date for Short term goals 07/06/2023)   1.  Patient will demonstrate independent use of home exercise program to maintain progress from in clinic treatments. Baseline: See objective data Goal status: Ongoing  06/08/2023  2. PROM 0* ext to 110* flexion Baseline: See objective data Goal status: Ongoing  06/08/2023  3. Interim PSFS average >4/10 Baseline: See objective data Goal status: Ongoing  06/08/2023  LONG TERM GOALS: (target dates for all long term goals 08/02/2023   )   1. Patient will demonstrate/report pain at worst less than or equal to 2/10 to facilitate minimal limitation in daily activity secondary to pain symptoms. Baseline: See objective data Goal status: Ongoing  06/08/2023   2. Patient will demonstrate independent  use of home exercise program to facilitate ability to maintain/progress functional gains from skilled physical therapy services. Baseline: See objective data Goal status: Ongoing  06/08/2023  3.  Patient reports Patient-Specific Activity Score improved the average by 5 to indicate improvement in functional activities.  Baseline: SEE OBJECTIVE DATA Goal status: Ongoing  06/08/2023   4.  Patient will demonstrate LLE MMT 5/5 throughout to faciltiate usual transfers, stairs, squatting at [facility] for daily life.  Baseline: See objective data Goal status: Ongoing  06/08/2023   5.  Patient left knee AROM 0* ext to 110* flexion.  Baseline: See objective data Goal status: Ongoing  06/08/2023   6. Left knee PROM 0* to 120* Baseline: See objective data Goal status: Ongoing  06/08/2023   7.  patient ambulates >500', neg ramps, curbs and stairs 1 rail independently.  Baseline: See objective data Goal status: Ongoing  06/08/2023  PLAN:  PT FREQUENCY:  2x/week  PT DURATION: 8 weeks  PLANNED INTERVENTIONS: 97164- PT Re-evaluation, 97110-Therapeutic exercises, 97530- Therapeutic activity, 97112- Neuromuscular re-education, 97535- Self Care, 30865- Manual therapy, [phone]- Gait training, [phone]- Aquatic Therapy, [phone]- Electrical stimulation (unattended), [phone]- Electrical stimulation (manual), 97016- Vasopneumatic device, Patient/Family education, Balance training, Stair training, Taping, Dry Needling, Joint mobilization, Scar mobilization, Vestibular training, DME instructions, Cryotherapy, Moist heat, and physical performance testing.   PLAN FOR NEXT SESSION: Strength, balance gains.  [facility] 15 visit authorized.    Chyrel Masson, PT, DPT, OCS, ATC 06/12/23  10:54 AM

## 2023-06-16 ENCOUNTER — Telehealth: Payer: Self-pay | Admitting: Physician Assistant

## 2023-06-16 ENCOUNTER — Ambulatory Visit: Admitting: Rehabilitative and Restorative Service Providers"

## 2023-06-16 ENCOUNTER — Other Ambulatory Visit: Payer: Self-pay | Admitting: Surgical

## 2023-06-16 ENCOUNTER — Encounter: Payer: Self-pay | Admitting: Rehabilitative and Restorative Service Providers"

## 2023-06-16 DIAGNOSIS — M6281 Muscle weakness (generalized): Secondary | ICD-10-CM | POA: Diagnosis not present

## 2023-06-16 DIAGNOSIS — R2689 Other abnormalities of gait and mobility: Secondary | ICD-10-CM | POA: Diagnosis not present

## 2023-06-16 DIAGNOSIS — M25562 Pain in left knee: Secondary | ICD-10-CM

## 2023-06-16 DIAGNOSIS — G8929 Other chronic pain: Secondary | ICD-10-CM

## 2023-06-16 DIAGNOSIS — R600 Localized edema: Secondary | ICD-10-CM

## 2023-06-16 DIAGNOSIS — R2681 Unsteadiness on feet: Secondary | ICD-10-CM

## 2023-06-16 DIAGNOSIS — M25662 Stiffness of left knee, not elsewhere classified: Secondary | ICD-10-CM

## 2023-06-16 MED ORDER — OXYCODONE-ACETAMINOPHEN 5-325 MG PO TABS
1.0000 | ORAL_TABLET | Freq: Two times a day (BID) | ORAL | 0 refills | Status: DC | PRN
Start: 2023-06-16 — End: 2023-10-06

## 2023-06-16 NOTE — Telephone Encounter (Signed)
 Called and notified patient.

## 2023-06-16 NOTE — Telephone Encounter (Signed)
 Sent in refill

## 2023-06-16 NOTE — Therapy (Signed)
 OUTPATIENT PHYSICAL THERAPY  TREATMENT   Patient Name: Jeffrey [name] MRN: [id number] DOB:01/09/1956, 68 y.o., male Today's Date: 06/16/2023  END OF SESSION:  PT End of Session - 06/16/23 0800     Visit Number 4    Number of Visits 15    Date for PT Re-Evaluation 08/03/23    Authorization Type [facility]    Authorization Time Period 15 visits  2/14-8/13/25    Authorization - Number of Visits 15    Progress Note Due on Visit 10    PT Start Time 0759    PT Stop Time 0854    PT Time Calculation (min) 55 min    Activity Tolerance Patient tolerated treatment well;No increased pain;Patient limited by fatigue    Behavior During Therapy [facility] for tasks assessed/performed             Past Medical History:  Diagnosis Date   Aneurysm of infrarenal abdominal aorta (HCC)    CAD (coronary artery disease)    4 stents   Carotid artery disease (HCC)    Left ICA   Diabetes mellitus without complication (HCC)    Dysrhythmia    A. Fib   Hyperlipidemia    Intracerebral hemorrhage (HCC) 1960   Peripheral neuropathy    Post-traumatic osteoarthritis of left knee    Past Surgical History:  Procedure Laterality Date   APPENDECTOMY  1960   I & D EXTREMITY Right 06/19/2020   Procedure: IRRIGATION AND DEBRIDEMENT LONG FINGER;  Surgeon: Betha Loa, MD;  Location: [facility] OR;  Service: Orthopedics;  Laterality: Right;   LEFT HEART CATH  06/20/2018   ORIF FEMUR FRACTURE  1988   TOTAL KNEE ARTHROPLASTY Left 05/12/2023   Procedure: LEFT TOTAL KNEE ARTHROPLASTY;  Surgeon: Tarry Kos, MD;  Location: [facility] OR;  Service: Orthopedics;  Laterality: Left;   VARICOSE VEIN SURGERY Right    right leg   Patient Active Problem List   Diagnosis Date Noted   Status post total left knee replacement 05/12/2023   Post-traumatic osteoarthritis of left knee 01/31/2023   Atrial fibrillation with rapid ventricular response (HCC) 11/26/2021   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   Occlusion and stenosis of carotid artery  without mention of cerebral infarction 11/19/2013    PCP: Lise Auer, MD  REFERRING PROVIDER: Tarry Kos, MD  REFERRING DIAG: [phone] (ICD-10-CM) - H/O total knee replacement, left   THERAPY DIAG:  Chronic pain of left knee  Muscle weakness (generalized)  Stiffness of left knee, not elsewhere classified  Other abnormalities of gait and mobility  Unsteadiness on feet  Localized edema  Rationale for Evaluation and Treatment: Rehabilitation  ONSET DATE: 05/12/2023  Left TKA  SUBJECTIVE:   SUBJECTIVE STATEMENT: Jeffrey [name] notes sleep is typically 3-4 hours uninterrupted.  Oxycodone (1 Q 8 hours).  HEP compliance is good.  PERTINENT HISTORY: Left TKA 05/12/23, OA Left femur fx (shaft just proximal to distal plate) ORIF 9147 & removal 01/05/22, A-Fib, CAD 4 stents, carotid artery disease, DM, HTN, HLD, Depression, aneurysm of infrarenal abdominal aorta, peripheral neuropathy  PAIN:  NPRS scale: Left knee 1-9/10 this week Pain location: left knee more on medial / lateral joint line.  Pain description: dull ache, sore Aggravating factors: bend it, prolonged postures, rainy weather Relieving factors:  rubbing, pain meds, ice, elevation  PRECAUTIONS: None  WEIGHT BEARING RESTRICTIONS: No  FALLS:  Has patient fallen in last 6 months? No  LIVING ENVIRONMENT: Lives with: lives with their spouse and dog 55#   l[place]  [place] farm.  Tractor climb.  Lives in: House Stairs: Yes:  External: 5 steps; 2 rails can reach both. Has following equipment at home: Single point cane, Walker - 2 wheeled, Wheelchair (manual), [employer], and Grab bars  OCCUPATION: retired.  Works on farm. Climb tractor.  Handyman work.  PLOF: Independent  PATIENT GOALS:   work on his farm.  Walk around. Travel with RV (bumper pull) needs to set up.    Next MD visit: 06/27/2023  OBJECTIVE:  DIAGNOSTIC FINDINGS: 05/12/23 X-ray following TKA no complications.  Patient-Specific Activity Scoring Scheme  "0"  represents "unable to perform." "10" represents "able to perform at prior level. 0 1 2 3 4 5 6 7 8 9  10 (Date and Score)   Activity Eval  06/06/2023    1. Climb on tractor  0    2. Walk on paved surfaces 2     3. Walk on uneven like farm  2   4. Climb stairs  2   5. Carry items & standing ADLs 2   Score 1.6    Total score = sum of the activity scores/number of activities Minimum detectable change (90%CI) for average score = 2 points Minimum detectable change (90%CI) for single activity score = 3 points  COGNITION: 06/06/2023 Overall cognitive status: WFL    SENSATION: 06/06/2023 WFL  EDEMA:  Circumferential:   LLE: above knee 47.8cm,  around knee 45.5 cm, below knee 39.6 cm RLE: above knee 43.1 cm,  around knee 41.6 cm, below knee 35.4 cm  POSTURE: 06/06/2023   rounded shoulders, forward head, flexed trunk , and weight shift right  PALPATION: 06/06/2023 Tenderness around left knee joint line, quadriceps tendon and patella tendon.   Scar has decreased mobility especially over patella and tibia.   LOWER EXTREMITY ROM:   ROM Left Eval 06/06/2023 Left 06/12/2023  Hip flexion    Hip extension    Hip abduction    Hip adduction    Hip internal rotation    Hip external rotation    Knee flexion Seated A: 94* Supine A: 95* P: 99* Supine AROM heel slide  109  Knee extension Seated  LAQ -30* Supine  Quad set -9* P: -7* -3 in supine quad set  Ankle dorsiflexion    Ankle plantarflexion    Ankle inversion    Ankle eversion     (Blank rows = not tested)  LOWER EXTREMITY MMT:  MMT Left Eval 06/06/2023  Hip flexion   Hip extension   Hip abduction   Hip adduction   Hip internal rotation   Hip external rotation   Knee flexion 3-/5  Knee extension 3-/5  Ankle dorsiflexion   Ankle plantarflexion   Ankle inversion   Ankle eversion    (Blank rows = not tested)  FUNCTIONAL TESTS:  06/06/2023 18 inch chair transfer: heavy use of BUEs on armrests with LLE  decreased use.  GAIT: 06/06/2023 Distance walked: 100' Assistive device utilized: Environmental consultant - 2 wheeled Level of assistance: SBA / cues for deviations.  No balance issue with RW support.  Comments: excessive BUE weight bearing on RW,  left knee flexed in stance, minimal knee flexion and terminal stance and swing.                    TODAY'S TREATMENT  DATE: 06/16/2023 Recumbent bike Seat 11 for 5 minutes  Seated straight leg raises 3 sets of 5 for 3 seconds,   Functional Activities: Double Leg Press 75# full extension to stretch into flexion 12 x slow eccentrics Single Leg Press full extension to stretch into flexion 25# 12 x slow eccentrics  Neuromuscular re-education: Tandem balance: Wide stance (2 inch wide) 5 x 20 seconds each leg in front Dynamic heel to toe walk in parallel bars 4 laps  Vaso Left knee Medium 34* 10 minutes   06/12/2023 Therex: Nustep lvl 5 10 mins UE/LE for ROM Incline gastroc stretch 30 sec x 3 bilateral  Seated Lt leg LAQ 2 lbs with end range pause each direction 2 x 15 Supine AROM heel slide 5 sec hold x 10 Lt leg   Supine heel prop education and performance at start of vaso with focus on extension gains.   Performed to tolerance 3 mins . Additional time spent in general review of existing HEP.   Neuro Re-ed (to improve neural muscular recruitment, balance/control) Tandem stance in // bars c occasional HHA 1 min x 2 bilateral  Standing on foam anterior/posterior weight shifting x 20 with occasional HHA on // bars, SBA Supine quad set 5 sec hold Lt leg (performed in combo with heel slide, alternating)  TherActivity (to improve stairs, squatting, transfers) Leg press double leg 75 lbs x 15 slow movement focus, Lt leg x 15 31 lbs  Sit to stand to sit 20 inch table  height x 5 s UE assist   Vaso: 10 mins Lt knee 34 deg medium compression  - First 5 mins with heel prop to tolerance and  education in HEP.    06/08/2023: Therapeutic Exercise: Precor recumbent bike seat 9: rocking for flexion stretch for 2.5 minutes; then backwards full revolutions for 4.5 min; forward full revolutions 1 min.  Gastroc stretch on incline board 30 sec 2 reps Seated LLE LAQ 10 reps 2 sets Supine heel slide with strap & 12" ball 10 reps.  PT cued on HEP exercise.  Pt & wife verbalized an understanding.   Self-Care: Pt demo & verbal cues on weight shift onto LLE for 3 sec 5 reps prior to walking.  Pt return demo & verbalized understanding. PT demo & verbal cues on positioning supine & sidelying "tenting" sheets off feet and between knees.  Pt & wife verbalized understanding.  PT demo & verbal cues on scar mobs.  Pt verbalized understanding.   PT used model to explain the TKA surgery.   Manual Therapy: PROM with overpressure traction with tibial int rot.   Supine grade IV mob for ext.    Vaso Left knee with elevation & ext stretch 34* medium compression for 10 min.    PATIENT EDUCATION:  Education details: HEP, POC Person educated: Patient Education method: Programmer, multimedia, Demonstration, Verbal cues, and Handouts Education comprehension: verbalized understanding, returned demonstration, and verbal cues required  HOME EXERCISE PROGRAM: Access Code: [email or web address] URL: [email or web address] Date: 06/16/2023 Prepared by: Pauletta Browns  Exercises - Ankle Alphabet in Elevation  - 2-4 x daily - 7 x weekly - 1 sets - 1 reps - Quad Setting and Stretching  - 2-4 x daily - 7 x weekly - 5-10 sets - 10 reps - prop 5-10 minutes & quad set5 seconds hold - Supine Heel Slide with Strap  - 2-3 x daily - 7 x weekly - 2-3 sets - 10 reps - 5 seconds hold - Supine Straight Leg Raises  -  2-3 x daily - 7 x weekly - 2-3 sets - 10 reps - 5 seconds hold - Seated Knee Flexion Extension AROM   - 2-4 x daily - 7 x weekly - 2-3 sets - 10 reps - 5 seconds hold - Seated straight leg lifts  - 2-3 x daily - 7 x  weekly - 2-3 sets - 10 reps - 5 seconds hold - Tandem Stance  - 1 x daily - 7 x weekly - 1 sets - 5 reps - 20 second hold - Small Range Straight Leg Raise  - 2 x daily - 7 x weekly - 3-5 sets - 5 reps - 3 seconds hold  ASSESSMENT: CLINICAL IMPRESSION: AROM, although not objectively assessed, looks good as Jeffrey [name] doesn't appear to be lacking much extension and he is able to pedal normally on the stationary bike.  Balance, function (steps, curbs, sit to stand) and balance was the emphasis today.  Continue post-surgical TKA work to meet long-term goals and get Jeffrey [name] independent with a cane (walker currently).  OBJECTIVE IMPAIRMENTS: Abnormal gait, decreased activity tolerance, decreased balance, decreased endurance, decreased knowledge of condition, decreased knowledge of use of DME, decreased mobility, difficulty walking, decreased ROM, decreased strength, increased edema, postural dysfunction, and pain.   ACTIVITY LIMITATIONS: carrying, lifting, bending, standing, squatting, sleeping, stairs, transfers, locomotion level, and climbing on farm equipment  PARTICIPATION LIMITATIONS: meal prep, cleaning, laundry, driving, community activity, occupation, yard work, and travel  PERSONAL FACTORS: Fitness, Past/current experiences, Time since onset of injury/illness/exacerbation, and 3+ comorbidities: see PMH  are also affecting patient's functional outcome.   REHAB POTENTIAL: Good  CLINICAL DECISION MAKING: Stable/uncomplicated  EVALUATION COMPLEXITY: Low   GOALS: Goals reviewed with patient? Yes  SHORT TERM GOALS: (target date for Short term goals 07/06/2023)   1.  Patient will demonstrate independent use of home exercise program to maintain progress from in clinic treatments. Baseline: See objective data Goal status: Ongoing  06/16/2023  2. PROM 0* ext to 110* flexion Baseline: See objective data Goal status: Ongoing  06/08/2023  3. Interim PSFS average >4/10 Baseline: See objective  data Goal status: Ongoing  06/08/2023  LONG TERM GOALS: (target dates for all long term goals 08/02/2023   )   1. Patient will demonstrate/report pain at worst less than or equal to 2/10 to facilitate minimal limitation in daily activity secondary to pain symptoms. Baseline: See objective data Goal status: Ongoing  06/16/2023   2. Patient will demonstrate independent use of home exercise program to facilitate ability to maintain/progress functional gains from skilled physical therapy services. Baseline: See objective data Goal status: Ongoing  06/16/2023  3.  Patient reports Patient-Specific Activity Score improved the average by 5 to indicate improvement in functional activities.  Baseline: SEE OBJECTIVE DATA Goal status: Ongoing  06/08/2023   4.  Patient will demonstrate LLE MMT 5/5 throughout to faciltiate usual transfers, stairs, squatting at [facility] for daily life.  Baseline: See objective data Goal status: Ongoing  06/16/2023   5.  Patient left knee AROM 0* ext to 110* flexion.  Baseline: See objective data Goal status: Ongoing  06/08/2023   6. Left knee PROM 0* to 120* Baseline: See objective data Goal status: Ongoing  06/08/2023   7.  patient ambulates >500', neg ramps, curbs and stairs 1 rail independently.  Baseline: See objective data Goal status: Ongoing  06/16/2023  PLAN:  PT FREQUENCY:  2x/week  PT DURATION: 8 weeks  PLANNED INTERVENTIONS: 97164- PT Re-evaluation, 97110-Therapeutic exercises, 97530- Therapeutic activity, 97112-  Neuromuscular re-education, [phone]- Self Care, 60454- Manual therapy, [phone]- Gait training, [phone]- Aquatic Therapy, [phone]- Electrical stimulation (unattended), [phone]- Electrical stimulation (manual), [phone]- Vasopneumatic device, Patient/Family education, Balance training, Stair training, Taping, Dry Needling, Joint mobilization, Scar mobilization, Vestibular training, DME instructions, Cryotherapy, Moist heat, and physical performance testing.   PLAN FOR  NEXT SESSION: Strength, balance gains.  Check AROM for short and long term goal updates.  [facility] 15 visit authorized.   Cherlyn Cushing PT, MPT  06/16/23  8:48 AM

## 2023-06-16 NOTE — Telephone Encounter (Signed)
 Patient was here for PT, he would like a refill on his pain medication.

## 2023-06-19 ENCOUNTER — Ambulatory Visit (INDEPENDENT_AMBULATORY_CARE_PROVIDER_SITE_OTHER): Admitting: Physical Therapy

## 2023-06-19 ENCOUNTER — Encounter: Payer: Self-pay | Admitting: Physical Therapy

## 2023-06-19 DIAGNOSIS — M6281 Muscle weakness (generalized): Secondary | ICD-10-CM

## 2023-06-19 DIAGNOSIS — M25662 Stiffness of left knee, not elsewhere classified: Secondary | ICD-10-CM

## 2023-06-19 DIAGNOSIS — G8929 Other chronic pain: Secondary | ICD-10-CM

## 2023-06-19 DIAGNOSIS — M25562 Pain in left knee: Secondary | ICD-10-CM | POA: Diagnosis not present

## 2023-06-19 DIAGNOSIS — R2681 Unsteadiness on feet: Secondary | ICD-10-CM

## 2023-06-19 DIAGNOSIS — R600 Localized edema: Secondary | ICD-10-CM

## 2023-06-19 DIAGNOSIS — R2689 Other abnormalities of gait and mobility: Secondary | ICD-10-CM | POA: Diagnosis not present

## 2023-06-19 NOTE — Therapy (Signed)
 OUTPATIENT PHYSICAL THERAPY  TREATMENT   Patient Name: Jeffrey [name] MRN: [id number] DOB:Jun 02, 1955, 68 y.o., male Today's Date: 06/19/2023  END OF SESSION:  PT End of Session - 06/19/23 0843     Visit Number 5    Number of Visits 15    Date for PT Re-Evaluation 08/03/23    Authorization Type [facility]    Authorization Time Period 15 visits  2/14-8/13/25    Authorization - Visit Number 5    Authorization - Number of Visits 15    Progress Note Due on Visit 10    PT Start Time 0840    PT Stop Time 0945    PT Time Calculation (min) 65 min    Activity Tolerance Patient tolerated treatment well;No increased pain;Patient limited by fatigue    Behavior During Therapy [facility] for tasks assessed/performed              Past Medical History:  Diagnosis Date   Aneurysm of infrarenal abdominal aorta (HCC)    CAD (coronary artery disease)    4 stents   Carotid artery disease (HCC)    Left ICA   Diabetes mellitus without complication (HCC)    Dysrhythmia    A. Fib   Hyperlipidemia    Intracerebral hemorrhage (HCC) 1960   Peripheral neuropathy    Post-traumatic osteoarthritis of left knee    Past Surgical History:  Procedure Laterality Date   APPENDECTOMY  1960   I & D EXTREMITY Right 06/19/2020   Procedure: IRRIGATION AND DEBRIDEMENT LONG FINGER;  Surgeon: Brunilda Capra, MD;  Location: [facility] OR;  Service: Orthopedics;  Laterality: Right;   LEFT HEART CATH  06/20/2018   ORIF FEMUR FRACTURE  1988   TOTAL KNEE ARTHROPLASTY Left 05/12/2023   Procedure: LEFT TOTAL KNEE ARTHROPLASTY;  Surgeon: Wes Hamman, MD;  Location: [facility] OR;  Service: Orthopedics;  Laterality: Left;   VARICOSE VEIN SURGERY Right    right leg   Patient Active Problem List   Diagnosis Date Noted   Status post total left knee replacement 05/12/2023   Post-traumatic osteoarthritis of left knee 01/31/2023   Atrial fibrillation with rapid ventricular response (HCC) 11/26/2021   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    Occlusion and stenosis of carotid artery without mention of cerebral infarction 11/19/2013    PCP: Beecher Bower, MD  REFERRING PROVIDER: Wes Hamman, MD  REFERRING DIAG: [phone] (ICD-10-CM) - H/O total knee replacement, left   THERAPY DIAG:  Chronic pain of left knee  Muscle weakness (generalized)  Stiffness of left knee, not elsewhere classified  Other abnormalities of gait and mobility  Unsteadiness on feet  Localized edema  Rationale for Evaluation and Treatment: Rehabilitation  ONSET DATE: 05/12/2023  Left TKA  SUBJECTIVE:   SUBJECTIVE STATEMENT: He is sleeping a little longer by taking 2 Oxy at bedtime.    PERTINENT HISTORY: Left TKA 05/12/23, OA Left femur fx (shaft just proximal to distal plate) ORIF 2130 & removal 01/05/22, A-Fib, CAD 4 stents, carotid artery disease, DM, HTN, HLD, Depression, aneurysm of infrarenal abdominal aorta, peripheral neuropathy  PAIN:  NPRS scale: Left knee today 6/10 and 1-10/10 this week Pain location: left knee more on medial / lateral joint line.  Pain description: dull ache, sore Aggravating factors: bend it, prolonged postures, rainy weather Relieving factors:  rubbing, pain meds, ice, elevation  PRECAUTIONS: None  WEIGHT BEARING RESTRICTIONS: No  FALLS:  Has patient fallen in last 6 months? No  LIVING ENVIRONMENT: Lives with: lives with their  spouse and dog 55#   [place]ives [place] farm.  Tractor climb.  Lives in: House Stairs: Yes:  External: 5 steps; 2 rails can reach both. Has following equipment at home: Single point cane, Walker - 2 wheeled, Wheelchair (manual), [employer], and Grab bars  OCCUPATION: retired.  Works on farm. Climb tractor.  Handyman work.  PLOF: Independent  PATIENT GOALS:   work on his farm.  Walk around. Travel with RV (bumper pull) needs to set up.    Next MD visit: 06/27/2023  OBJECTIVE:  DIAGNOSTIC FINDINGS: 05/12/23 X-ray following TKA no complications.  Patient-Specific Activity Scoring Scheme   "0" represents "unable to perform." "10" represents "able to perform at prior level. 0 1 2 3 4 5 6 7 8 9  10 (Date and Score)   Activity Eval  06/06/2023    1. Climb on tractor  0    2. Walk on paved surfaces 2     3. Walk on uneven like farm  2   4. Climb stairs  2   5. Carry items & standing ADLs 2   Score 1.6    Total score = sum of the activity scores/number of activities Minimum detectable change (90%CI) for average score = 2 points Minimum detectable change (90%CI) for single activity score = 3 points  COGNITION: 06/06/2023 Overall cognitive status: WFL    SENSATION: 06/06/2023 WFL  EDEMA:  Circumferential:   LLE: above knee 47.8cm,  around knee 45.5 cm, below knee 39.6 cm RLE: above knee 43.1 cm,  around knee 41.6 cm, below knee 35.4 cm  POSTURE: 06/06/2023   rounded shoulders, forward head, flexed trunk , and weight shift right  PALPATION: 06/06/2023 Tenderness around left knee joint line, quadriceps tendon and patella tendon.   Scar has decreased mobility especially over patella and tibia.   LOWER EXTREMITY ROM:   ROM Left Eval 06/06/2023 Left 06/12/2023  Hip flexion    Hip extension    Hip abduction    Hip adduction    Hip internal rotation    Hip external rotation    Knee flexion Seated A: 94* Supine A: 95* P: 99* Supine AROM heel slide  109  Knee extension Seated  LAQ -30* Supine  Quad set -9* P: -7* -3 in supine quad set  Ankle dorsiflexion    Ankle plantarflexion    Ankle inversion    Ankle eversion     (Blank rows = not tested)  LOWER EXTREMITY MMT:  MMT Left Eval 06/06/2023  Hip flexion   Hip extension   Hip abduction   Hip adduction   Hip internal rotation   Hip external rotation   Knee flexion 3-/5  Knee extension 3-/5  Ankle dorsiflexion   Ankle plantarflexion   Ankle inversion   Ankle eversion    (Blank rows = not tested)  FUNCTIONAL TESTS:  06/06/2023 18 inch chair transfer: heavy use of BUEs on armrests with  LLE decreased use.  GAIT: 06/06/2023 Distance walked: 100' Assistive device utilized: Environmental consultant - 2 wheeled Level of assistance: SBA / cues for deviations.  No balance issue with RW support.  Comments: excessive BUE weight bearing on RW,  left knee flexed in stance, minimal knee flexion and terminal stance and swing.                    TODAY'S TREATMENT  DATE: 06/19/2023 Therapeutic Exercise: Precor recumbent bike seat 9: full revolutions level 1 8 min.  PT recommended bike 10 min 3 sets with timed 5 min rests; once able to tolerate then begin to decrease rests by 1 min until 30 min straight. Pt verbalized understanding.  Hamstring stretch SLR with strap 30 sec hold 2 reps LLE Gastroc stretch step heel depression 30 sec hold  Heel raises BLEs 10 reps BUE support (minimal range) Quad stretch hooklying strap flexion 30 sec hold 2 reps PT added stretches to HEP with HO.  Pt verbalized understanding.   Therapeutic Activities: Leg press BLEs 87# 15 reps 1 set;  marching 5 reps per LE 3 sets 75#;  LLE only 43# 10 reps 2 sets; Tandem stance on floor LLE in front & in back 30 sec 2 reps ea  Self-Care Scar mobilizations with PT educating pt while PT performing (added to HO). Pt verbalized understanding.  PT educated on not taking Oxy on empty stomach, signs of dehydration & relationship to "light-headedness"  pt verbalized understanding.   Manual Therapy: PROM with overpressure for knee flexion with traction and tibial int rot.    Vaso Left knee with elevation & ext stretch 34* medium compression for 10 min.     TREATMENT                                                                          DATE: 06/16/2023 Recumbent bike Seat 11 for 5 minutes  Seated straight leg raises 3 sets of 5 for 3 seconds,   Functional Activities: Double Leg Press 75# full extension to stretch into flexion 12 x slow eccentrics Single Leg  Press full extension to stretch into flexion 25# 12 x slow eccentrics  Neuromuscular re-education: Tandem balance: Wide stance (2 inch wide) 5 x 20 seconds each leg in front Dynamic heel to toe walk in parallel bars 4 laps  Vaso Left knee Medium 34* 10 minutes   06/12/2023 Therex: Nustep lvl 5 10 mins UE/LE for ROM Incline gastroc stretch 30 sec x 3 bilateral  Seated Lt leg LAQ 2 lbs with end range pause each direction 2 x 15 Supine AROM heel slide 5 sec hold x 10 Lt leg   Supine heel prop education and performance at start of vaso with focus on extension gains.   Performed to tolerance 3 mins . Additional time spent in general review of existing HEP.   Neuro Re-ed (to improve neural muscular recruitment, balance/control) Tandem stance in // bars c occasional HHA 1 min x 2 bilateral  Standing on foam anterior/posterior weight shifting x 20 with occasional HHA on // bars, SBA Supine quad set 5 sec hold Lt leg (performed in combo with heel slide, alternating)  TherActivity (to improve stairs, squatting, transfers) Leg press double leg 75 lbs x 15 slow movement focus, Lt leg x 15 31 lbs  Sit to stand to sit 20 inch table  height x 5 s UE assist   Vaso: 10 mins Lt knee 34 deg medium compression  - First 5 mins with heel prop to tolerance and education in HEP.     PATIENT EDUCATION:  Education details: HEP, POC Person educated: Patient Education method: Programmer, multimedia, Facilities manager, Verbal  cues, and Handouts Education comprehension: verbalized understanding, returned demonstration, and verbal cues required  HOME EXERCISE PROGRAM: Access Code: [email or web address] URL: [email or web address] Date: 06/19/2023 Prepared by: Lorie Rook  Exercises - Ankle Alphabet in Elevation  - 2-4 x daily - 7 x weekly - 1 sets - 1 reps - Quad Setting and Stretching  - 2-4 x daily - 7 x weekly - 5-10 sets - 10 reps - prop 5-10 minutes & quad set5 seconds hold - Supine Heel Slide with Strap  -  2-3 x daily - 7 x weekly - 2-3 sets - 10 reps - 5 seconds hold - Supine Straight Leg Raises  - 2-3 x daily - 7 x weekly - 2-3 sets - 10 reps - 5 seconds hold - Seated Knee Flexion Extension AROM   - 2-4 x daily - 7 x weekly - 2-3 sets - 10 reps - 5 seconds hold - Seated straight leg lifts  - 2-3 x daily - 7 x weekly - 2-3 sets - 10 reps - 5 seconds hold - Tandem Stance  - 1 x daily - 7 x weekly - 1 sets - 5 reps - 20 second hold - Small Range Straight Leg Raise  - 2 x daily - 7 x weekly - 3-5 sets - 5 reps - 3 seconds hold - Hooklying Hamstring Stretch with Strap  - 1-2 x daily - 7 x weekly - 1 sets - 2-3 reps - 30 seconds hold - Gastroc Stretch on Step  - 1-2 x daily - 7 x weekly - 1 sets - 2-3 reps - 30 seconds hold - Supine Quadriceps Stretch with Strap on Table  - 1-2 x daily - 7 x weekly - 1 sets - 2-3 reps - 30 seconds hold  Patient Education - Scar Massage    ASSESSMENT: CLINICAL IMPRESSION: Patient appears to understand stretches that PT added to HEP.  His range and functional strength are improving.  He continues to benefit from skilled PT.    OBJECTIVE IMPAIRMENTS: Abnormal gait, decreased activity tolerance, decreased balance, decreased endurance, decreased knowledge of condition, decreased knowledge of use of DME, decreased mobility, difficulty walking, decreased ROM, decreased strength, increased edema, postural dysfunction, and pain.   ACTIVITY LIMITATIONS: carrying, lifting, bending, standing, squatting, sleeping, stairs, transfers, locomotion level, and climbing on farm equipment  PARTICIPATION LIMITATIONS: meal prep, cleaning, laundry, driving, community activity, occupation, yard work, and travel  PERSONAL FACTORS: Fitness, Past/current experiences, Time since onset of injury/illness/exacerbation, and 3+ comorbidities: see PMH  are also affecting patient's functional outcome.   REHAB POTENTIAL: Good  CLINICAL DECISION MAKING: Stable/uncomplicated  EVALUATION  COMPLEXITY: Low   GOALS: Goals reviewed with patient? Yes  SHORT TERM GOALS: (target date for Short term goals 07/06/2023)   1.  Patient will demonstrate independent use of home exercise program to maintain progress from in clinic treatments. Baseline: See objective data Goal status: Ongoing   06/19/2023  2. PROM 0* ext to 110* flexion Baseline: See objective data Goal status: Ongoing   06/19/2023  3. Interim PSFS average >4/10 Baseline: See objective data Goal status: Ongoing   06/19/2023  LONG TERM GOALS: (target dates for all long term goals 08/02/2023   )   1. Patient will demonstrate/report pain at worst less than or equal to 2/10 to facilitate minimal limitation in daily activity secondary to pain symptoms. Baseline: See objective data Goal status: Ongoing  06/19/2023   2. Patient will demonstrate independent use of home exercise program to facilitate ability to maintain/progress  functional gains from skilled physical therapy services. Baseline: See objective data Goal status: Ongoing   06/19/2023  3.  Patient reports Patient-Specific Activity Score improved the average by 5 to indicate improvement in functional activities.  Baseline: SEE OBJECTIVE DATA Goal status: Ongoing   06/19/2023   4.  Patient will demonstrate LLE MMT 5/5 throughout to faciltiate usual transfers, stairs, squatting at [facility] for daily life.  Baseline: See objective data Goal status: Ongoing   06/19/2023   5.  Patient left knee AROM 0* ext to 110* flexion.  Baseline: See objective data Goal status: Ongoing   06/19/2023   6. Left knee PROM 0* to 120* Baseline: See objective data Goal status: Ongoing   06/19/2023   7.  patient ambulates >500', neg ramps, curbs and stairs 1 rail independently.  Baseline: See objective data Goal status: Ongoing  06/19/2023  PLAN:  PT FREQUENCY:  2x/week  PT DURATION: 8 weeks  PLANNED INTERVENTIONS: 97164- PT Re-evaluation, 97110-Therapeutic exercises, 97530-  Therapeutic activity, 97112- Neuromuscular re-education, 97535- Self Care, 86578- Manual therapy, [phone]- Gait training, [phone]- Aquatic Therapy, [phone]- Electrical stimulation (unattended), [phone]- Electrical stimulation (manual), 97016- Vasopneumatic device, Patient/Family education, Balance training, Stair training, Taping, Dry Needling, Joint mobilization, Scar mobilization, Vestibular training, DME instructions, Cryotherapy, Moist heat, and physical performance testing.   PLAN FOR NEXT SESSION: see note to MD prior to appt 4/22.    Strength, balance gains.  Check AROM for short and long term goal updates.  [facility] 15 visit authorized.    Lorie Rook, PT, DPT 06/19/2023, 10:39 AM

## 2023-06-22 ENCOUNTER — Ambulatory Visit (INDEPENDENT_AMBULATORY_CARE_PROVIDER_SITE_OTHER): Admitting: Physical Therapy

## 2023-06-22 ENCOUNTER — Encounter: Payer: Self-pay | Admitting: Physical Therapy

## 2023-06-22 DIAGNOSIS — M25662 Stiffness of left knee, not elsewhere classified: Secondary | ICD-10-CM | POA: Diagnosis not present

## 2023-06-22 DIAGNOSIS — R2689 Other abnormalities of gait and mobility: Secondary | ICD-10-CM | POA: Diagnosis not present

## 2023-06-22 DIAGNOSIS — M25562 Pain in left knee: Secondary | ICD-10-CM

## 2023-06-22 DIAGNOSIS — R2681 Unsteadiness on feet: Secondary | ICD-10-CM

## 2023-06-22 DIAGNOSIS — M6281 Muscle weakness (generalized): Secondary | ICD-10-CM | POA: Diagnosis not present

## 2023-06-22 DIAGNOSIS — G8929 Other chronic pain: Secondary | ICD-10-CM

## 2023-06-22 DIAGNOSIS — R600 Localized edema: Secondary | ICD-10-CM

## 2023-06-22 NOTE — Therapy (Signed)
 OUTPATIENT PHYSICAL THERAPY  TREATMENT   Patient Name: Jeffrey [name] MRN: [id number] DOB:1955/10/04, 68 y.o., male Today's Date: 06/22/2023  END OF SESSION:  PT End of Session - 06/22/23 0846     Visit Number 6    Number of Visits 15    Date for PT Re-Evaluation 08/03/23    Authorization Type [facility]    Authorization Time Period 15 visits  2/14-8/13/25    Authorization - Visit Number 6    Authorization - Number of Visits 15    Progress Note Due on Visit 10    PT Start Time 0846    PT Stop Time 0940    PT Time Calculation (min) 54 min    Activity Tolerance Patient tolerated treatment well;No increased pain;Patient limited by fatigue    Behavior During Therapy [facility] for tasks assessed/performed               Past Medical History:  Diagnosis Date   Aneurysm of infrarenal abdominal aorta (HCC)    CAD (coronary artery disease)    4 stents   Carotid artery disease (HCC)    Left ICA   Diabetes mellitus without complication (HCC)    Dysrhythmia    A. Fib   Hyperlipidemia    Intracerebral hemorrhage (HCC) 1960   Peripheral neuropathy    Post-traumatic osteoarthritis of left knee    Past Surgical History:  Procedure Laterality Date   APPENDECTOMY  1960   I & D EXTREMITY Right 06/19/2020   Procedure: IRRIGATION AND DEBRIDEMENT LONG FINGER;  Surgeon: Brunilda Capra, MD;  Location: [facility] OR;  Service: Orthopedics;  Laterality: Right;   LEFT HEART CATH  06/20/2018   ORIF FEMUR FRACTURE  1988   TOTAL KNEE ARTHROPLASTY Left 05/12/2023   Procedure: LEFT TOTAL KNEE ARTHROPLASTY;  Surgeon: Wes Hamman, MD;  Location: [facility] OR;  Service: Orthopedics;  Laterality: Left;   VARICOSE VEIN SURGERY Right    right leg   Patient Active Problem List   Diagnosis Date Noted   Status post total left knee replacement 05/12/2023   Post-traumatic osteoarthritis of left knee 01/31/2023   Atrial fibrillation with rapid ventricular response (HCC) 11/26/2021   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    Occlusion and stenosis of carotid artery without mention of cerebral infarction 11/19/2013    PCP: Beecher Bower, MD  REFERRING PROVIDER: Wes Hamman, MD  REFERRING DIAG: [phone] (ICD-10-CM) - H/O total knee replacement, left   THERAPY DIAG:  Chronic pain of left knee  Muscle weakness (generalized)  Stiffness of left knee, not elsewhere classified  Other abnormalities of gait and mobility  Unsteadiness on feet  Localized edema  Rationale for Evaluation and Treatment: Rehabilitation  ONSET DATE: 05/12/2023  Left TKA  SUBJECTIVE:   SUBJECTIVE STATEMENT: He stepped in hole in yard walking with RW. The knee did not give out but twisted some with increased swelling for short period.     PERTINENT HISTORY: Left TKA 05/12/23, OA Left femur fx (shaft just proximal to distal plate) ORIF 9147 & removal 01/05/22, A-Fib, CAD 4 stents, carotid artery disease, DM, HTN, HLD, Depression, aneurysm of infrarenal abdominal aorta, peripheral neuropathy  PAIN:  NPRS scale: Left knee today at rest & moving knee 2-3/10 and 1-10/10 this week Pain location: left knee more on medial / lateral joint line.  Pain description: dull ache, sore Aggravating factors: bend it, prolonged postures, rainy weather Relieving factors:  rubbing, pain meds, ice, elevation  PRECAUTIONS: None  WEIGHT BEARING RESTRICTIONS: No  FALLS:  Has patient fallen in last 6 months? No  LIVING ENVIRONMENT: Lives with: lives with their spouse and dog 55#   lives [place] farm.  Tractor climb.  Lives in: House Stairs: Yes:  External: 5 steps; 2 rails can reach both. Has following equipment at home: Single point cane, Walker - 2 wheeled, Wheelchair (manual), [employer], and Grab bars  OCCUPATION: retired.  Works on farm. Climb tractor.  Handyman work.  PLOF: Independent  PATIENT GOALS:   work on his farm.  Walk around. Travel with RV (bumper pull) needs to set up.    Next MD visit: 06/27/2023  OBJECTIVE:  DIAGNOSTIC  FINDINGS: 05/12/23 X-ray following TKA no complications.  Patient-Specific Activity Scoring Scheme  "0" represents "unable to perform." "10" represents "able to perform at prior level. 0 1 2 3 4 5 6 7 8 9  10 (Date and Score)   Activity Eval  06/06/2023    1. Climb on tractor  0    2. Walk on paved surfaces 2     3. Walk on uneven like farm  2   4. Climb stairs  2   5. Carry items & standing ADLs 2   Score 1.6    Total score = sum of the activity scores/number of activities Minimum detectable change (90%CI) for average score = 2 points Minimum detectable change (90%CI) for single activity score = 3 points  COGNITION: 06/06/2023 Overall cognitive status: WFL    SENSATION: 06/06/2023 WFL  EDEMA:  Circumferential:   LLE: above knee 47.8cm,  around knee 45.5 cm, below knee 39.6 cm RLE: above knee 43.1 cm,  around knee 41.6 cm, below knee 35.4 cm  POSTURE: 06/06/2023   rounded shoulders, forward head, flexed trunk , and weight shift right  PALPATION: 06/06/2023 Tenderness around left knee joint line, quadriceps tendon and patella tendon.   Scar has decreased mobility especially over patella and tibia.   LOWER EXTREMITY ROM:   ROM Left Eval 06/06/2023 Left 06/12/23 Left 06/22/23  Hip flexion     Hip extension     Hip abduction     Hip adduction     Hip internal rotation     Hip external rotation     Knee flexion Seated A: 94* Supine A: 95* P: 99* Supine AROM heel slide  109 Seated A: 107* P: 111*  Knee extension Seated  LAQ -30* Supine  Quad set -9* P: -7* -3 in supine quad set   Ankle dorsiflexion     Ankle plantarflexion     Ankle inversion     Ankle eversion      (Blank rows = not tested)  LOWER EXTREMITY MMT:  MMT Left Eval 06/06/2023  Hip flexion   Hip extension   Hip abduction   Hip adduction   Hip internal rotation   Hip external rotation   Knee flexion 3-/5  Knee extension 3-/5  Ankle dorsiflexion   Ankle plantarflexion   Ankle  inversion   Ankle eversion    (Blank rows = not tested)  FUNCTIONAL TESTS:  06/06/2023 18 inch chair transfer: heavy use of BUEs on armrests with LLE decreased use.  GAIT: 06/06/2023 Distance walked: 100' Assistive device utilized: Environmental consultant - 2 wheeled Level of assistance: SBA / cues for deviations.  No balance issue with RW support.  Comments: excessive BUE weight bearing on RW,  left knee flexed in stance, minimal knee flexion and terminal stance and swing.  TODAY'S TREATMENT                                                                          DATE: 06/22/2023 Therapeutic Exercise: Precor recumbent bike seat 9: rocking flexion stretch for 3.5 min;  full revolutions level 1 4.5 min. Gastroc stretch step heel depression 30 sec hold 3 reps with ea LE Seated LLE LAQ 3# with RLE active knee flexion at end range of LLE LAQ to facilitate stronger contraction and controlled slow eccentric 10 reps 2 sets  Therapeutic Activities: Tandem stance on floor eyes open 30 sec ea LLE in front & in back occasional intermittent touch; eyes closed with increased intermittent touch //bars; on foam eyes open with increased intermittent touch.   Step up & down using LLE 4" step with single rail 10 reps;  PT demo & verbal cues on technique prior & during activity. Side Stepping over 6" hurdle with BUE support; then holding step position for 5 sec without UE support.  Alternating LEs. 5 reps with ea LE.  PT demo & verbal cues on technique prior & during activity. Sit to/from stand 18" chair using BUEs on seat (not armrests) 10 reps. Not touching RW to stabilize.  PT demo & verbal cues on technique prior & during activity.  Manual Therapy: PROM with overpressure for knee flexion with traction and tibial int rot.    Vaso Left knee with elevation & ext stretch 34* medium compression for 10 min.     TREATMENT                                                                           DATE: 06/19/2023 Therapeutic Exercise: Precor recumbent bike seat 9: full revolutions level 1 8 min.  PT recommended bike 10 min 3 sets with timed 5 min rests; once able to tolerate then begin to decrease rests by 1 min until 30 min straight. Pt verbalized understanding.  Hamstring stretch SLR with strap 30 sec hold 2 reps LLE Gastroc stretch step heel depression 30 sec hold  Heel raises BLEs 10 reps BUE support (minimal range) Quad stretch hooklying strap flexion 30 sec hold 2 reps PT added stretches to HEP with HO.  Pt verbalized understanding.   Therapeutic Activities: Leg press BLEs 87# 15 reps 1 set;  marching 5 reps per LE 3 sets 75#;  LLE only 43# 10 reps 2 sets; Tandem stance on floor LLE in front & in back 30 sec 2 reps ea.   Self-Care Scar mobilizations with PT educating pt while PT performing (added to HO). Pt verbalized understanding.  PT educated on not taking Oxy on empty stomach, signs of dehydration & relationship to "light-headedness"  pt verbalized understanding.   Manual Therapy: PROM with overpressure for knee flexion with traction and tibial int rot.    Vaso Left knee with elevation & ext stretch 34* medium compression for 10 min.     TREATMENT  DATE: 06/16/2023 Recumbent bike Seat 11 for 5 minutes  Seated straight leg raises 3 sets of 5 for 3 seconds,   Functional Activities: Double Leg Press 75# full extension to stretch into flexion 12 x slow eccentrics Single Leg Press full extension to stretch into flexion 25# 12 x slow eccentrics  Neuromuscular re-education: Tandem balance: Wide stance (2 inch wide) 5 x 20 seconds each leg in front Dynamic heel to toe walk in parallel bars 4 laps  Vaso Left knee Medium 34* 10 minutes     PATIENT EDUCATION:  Education details: HEP, POC Person educated: Patient Education method: Explanation, Demonstration, Verbal cues, and Handouts Education  comprehension: verbalized understanding, returned demonstration, and verbal cues required  HOME EXERCISE PROGRAM: Access Code: [email or web address] URL: [email or web address] Date: 06/19/2023 Prepared by: Vladimir Faster  Exercises - Ankle Alphabet in Elevation  - 2-4 x daily - 7 x weekly - 1 sets - 1 reps - Quad Setting and Stretching  - 2-4 x daily - 7 x weekly - 5-10 sets - 10 reps - prop 5-10 minutes & quad set5 seconds hold - Supine Heel Slide with Strap  - 2-3 x daily - 7 x weekly - 2-3 sets - 10 reps - 5 seconds hold - Supine Straight Leg Raises  - 2-3 x daily - 7 x weekly - 2-3 sets - 10 reps - 5 seconds hold - Seated Knee Flexion Extension AROM   - 2-4 x daily - 7 x weekly - 2-3 sets - 10 reps - 5 seconds hold - Seated straight leg lifts  - 2-3 x daily - 7 x weekly - 2-3 sets - 10 reps - 5 seconds hold - Tandem Stance  - 1 x daily - 7 x weekly - 1 sets - 5 reps - 20 second hold - Small Range Straight Leg Raise  - 2 x daily - 7 x weekly - 3-5 sets - 5 reps - 3 seconds hold - Hooklying Hamstring Stretch with Strap  - 1-2 x daily - 7 x weekly - 1 sets - 2-3 reps - 30 seconds hold - Gastroc Stretch on Step  - 1-2 x daily - 7 x weekly - 1 sets - 2-3 reps - 30 seconds hold - Supine Quadriceps Stretch with Strap on Table  - 1-2 x daily - 7 x weekly - 1 sets - 2-3 reps - 30 seconds hold  Patient Education - Scar Massage    ASSESSMENT: CLINICAL IMPRESSION: PT progressed exercises and activities functionally which he tolerated well.  His range is improving.   He continues to benefit from skilled PT.    OBJECTIVE IMPAIRMENTS: Abnormal gait, decreased activity tolerance, decreased balance, decreased endurance, decreased knowledge of condition, decreased knowledge of use of DME, decreased mobility, difficulty walking, decreased ROM, decreased strength, increased edema, postural dysfunction, and pain.   ACTIVITY LIMITATIONS: carrying, lifting, bending, standing, squatting, sleeping,  stairs, transfers, locomotion level, and climbing on farm equipment  PARTICIPATION LIMITATIONS: meal prep, cleaning, laundry, driving, community activity, occupation, yard work, and travel  PERSONAL FACTORS: Fitness, Past/current experiences, Time since onset of injury/illness/exacerbation, and 3+ comorbidities: see PMH  are also affecting patient's functional outcome.   REHAB POTENTIAL: Good  CLINICAL DECISION MAKING: Stable/uncomplicated  EVALUATION COMPLEXITY: Low   GOALS: Goals reviewed with patient? Yes  SHORT TERM GOALS: (target date for Short term goals 07/06/2023)   1.  Patient will demonstrate independent use of home exercise program to maintain progress from in clinic treatments. Baseline: See objective data Goal  status: Ongoing   06/22/2023  2. PROM 0* ext to 110* flexion Baseline: See objective data Goal status: Ongoing   06/22/2023  3. Interim PSFS average >4/10 Baseline: See objective data Goal status: Ongoing   06/22/2023  LONG TERM GOALS: (target dates for all long term goals 08/02/2023   )   1. Patient will demonstrate/report pain at worst less than or equal to 2/10 to facilitate minimal limitation in daily activity secondary to pain symptoms. Baseline: See objective data Goal status: Ongoing  06/22/2023   2. Patient will demonstrate independent use of home exercise program to facilitate ability to maintain/progress functional gains from skilled physical therapy services. Baseline: See objective data Goal status: Ongoing   06/22/2023  3.  Patient reports Patient-Specific Activity Score improved the average by 5 to indicate improvement in functional activities.  Baseline: SEE OBJECTIVE DATA Goal status: Ongoing   06/22/2023   4.  Patient will demonstrate LLE MMT 5/5 throughout to faciltiate usual transfers, stairs, squatting at [facility] for daily life.  Baseline: See objective data Goal status: Ongoing   06/22/2023   5.  Patient left knee AROM 0* ext to 110* flexion.   Baseline: See objective data Goal status: Ongoing   06/22/2023   6. Left knee PROM 0* to 120* Baseline: See objective data Goal status: Ongoing   06/22/2023   7.  patient ambulates >500', neg ramps, curbs and stairs 1 rail independently.  Baseline: See objective data Goal status: Ongoing  06/22/2023  PLAN:  PT FREQUENCY:  2x/week  PT DURATION: 8 weeks  PLANNED INTERVENTIONS: 97164- PT Re-evaluation, 97110-Therapeutic exercises, 97530- Therapeutic activity, 97112- Neuromuscular re-education, 97535- Self Care, 24401- Manual therapy, [phone]- Gait training, [phone]- Aquatic Therapy, [phone]- Electrical stimulation (unattended), [phone]- Electrical stimulation (manual), 97016- Vasopneumatic device, Patient/Family education, Balance training, Stair training, Taping, Dry Needling, Joint mobilization, Scar mobilization, Vestibular training, DME instructions, Cryotherapy, Moist heat, and physical performance testing.   PLAN FOR NEXT SESSION:  continue with progressive exercises for Strength & balance gains.   [facility] 15 visit authorized.    Lorie Rook, PT, DPT 06/22/2023, 1:03 PM

## 2023-06-27 ENCOUNTER — Ambulatory Visit (INDEPENDENT_AMBULATORY_CARE_PROVIDER_SITE_OTHER): Admitting: Physical Therapy

## 2023-06-27 ENCOUNTER — Ambulatory Visit (INDEPENDENT_AMBULATORY_CARE_PROVIDER_SITE_OTHER): Admitting: Physician Assistant

## 2023-06-27 ENCOUNTER — Encounter: Payer: Self-pay | Admitting: Physical Therapy

## 2023-06-27 ENCOUNTER — Ambulatory Visit (INDEPENDENT_AMBULATORY_CARE_PROVIDER_SITE_OTHER)

## 2023-06-27 DIAGNOSIS — Z96652 Presence of left artificial knee joint: Secondary | ICD-10-CM

## 2023-06-27 DIAGNOSIS — G8929 Other chronic pain: Secondary | ICD-10-CM

## 2023-06-27 DIAGNOSIS — M25662 Stiffness of left knee, not elsewhere classified: Secondary | ICD-10-CM

## 2023-06-27 DIAGNOSIS — M25562 Pain in left knee: Secondary | ICD-10-CM

## 2023-06-27 DIAGNOSIS — M6281 Muscle weakness (generalized): Secondary | ICD-10-CM | POA: Diagnosis not present

## 2023-06-27 DIAGNOSIS — R600 Localized edema: Secondary | ICD-10-CM

## 2023-06-27 DIAGNOSIS — R2689 Other abnormalities of gait and mobility: Secondary | ICD-10-CM | POA: Diagnosis not present

## 2023-06-27 DIAGNOSIS — R2681 Unsteadiness on feet: Secondary | ICD-10-CM

## 2023-06-27 MED ORDER — HYDROCODONE-ACETAMINOPHEN 5-325 MG PO TABS: 1.0000 | ORAL_TABLET | Freq: Three times a day (TID) | ORAL | 0 refills | Status: DC | PRN

## 2023-06-27 NOTE — Progress Notes (Signed)
 Post-Op Visit Note   Patient: Jeffrey [name]           Date of Birth: 28-Dec-1955           MRN: [id number] Visit Date: 06/27/2023 PCP: Beecher Bower, MD   Assessment & Plan:  Chief Complaint:  Chief Complaint  Patient presents with   Left Knee - Pain   Visit Diagnoses:  1. H/O total knee replacement, left     Plan: Patient is a pleasant 68 year old gentleman who comes in today 6 weeks status post left total knee replacement 05/12/2023.  He has been doing much better.  He has been in physical therapy and is ambulating with a walker.  He is taking oxycodone  for pain.  Examination of the left knee reveals a small to moderate effusion.  Fully healed surgical scar without complication.  Range of motion is 0 to 105 degrees.  He is stable to valgus varus stress.  He is neurovascular intact distally.  At this point, he will continue with physical therapy.  Dental prophylaxis reinforced.  I have refilled his meds and we have weaned to Norco.  He will follow-up in 6 weeks for recheck.  Of note he is on chronic Eliquis .  Follow-Up Instructions: Return in about 6 weeks (around 08/08/2023).   Orders:  Orders Placed This Encounter  Procedures   XR Knee 1-2 Views Left   Meds ordered this encounter  Medications   HYDROcodone -acetaminophen  (NORCO/VICODIN) 5-325 MG tablet    Sig: Take 1 tablet by mouth 3 (three) times daily as needed for moderate pain (pain score 4-6).    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 Fib   Hyperlipidemia    Intracerebral hemorrhage (HCC) 1960   Peripheral neuropathy    Post-traumatic osteoarthritis of left knee     Family History  Problem Relation Age of Onset   Vision loss Mother    Diabetes Mother    Crohn's disease Father     Past Surgical History:  Procedure Laterality Date   APPENDECTOMY  1960   I & D EXTREMITY Right 06/19/2020   Procedure: IRRIGATION AND DEBRIDEMENT LONG FINGER;  Surgeon: Brunilda Capra, MD;  Location: [facility] OR;  Service: Orthopedics;  Laterality: Right;   LEFT HEART CATH  06/20/2018   ORIF FEMUR FRACTURE  1988   TOTAL KNEE ARTHROPLASTY Left 05/12/2023   Procedure: LEFT TOTAL KNEE ARTHROPLASTY;  Surgeon: Wes Hamman, MD;  Location: [facility] OR;  Service: Orthopedics;  Laterality: Left;   VARICOSE VEIN SURGERY Right    right leg   Social History   Occupational History    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

## 2023-06-27 NOTE — Therapy (Signed)
 OUTPATIENT PHYSICAL THERAPY  TREATMENT   Patient Name: Jeffrey [name] MRN: [id number] DOB:01-01-1956, 68 y.o., male Today's Date: 06/27/2023  END OF SESSION:  PT End of Session - 06/27/23 1127     Visit Number 7    Number of Visits 15    Date for PT Re-Evaluation 08/03/23    Authorization Type [facility]    Authorization Time Period 15 visits  2/14-8/13/25    Authorization - Visit Number 7    Authorization - Number of Visits 15    Progress Note Due on Visit 10    PT Start Time 1015    PT Stop Time 1100    PT Time Calculation (min) 45 min    Activity Tolerance Patient tolerated treatment well;No increased pain;Patient limited by fatigue    Behavior During Therapy [facility] for tasks assessed/performed                Past Medical History:  Diagnosis Date   Aneurysm of infrarenal abdominal aorta (HCC)    CAD (coronary artery disease)    4 stents   Carotid artery disease (HCC)    Left ICA   Diabetes mellitus without complication (HCC)    Dysrhythmia    A. Fib   Hyperlipidemia    Intracerebral hemorrhage (HCC) 1960   Peripheral neuropathy    Post-traumatic osteoarthritis of left knee    Past Surgical History:  Procedure Laterality Date   APPENDECTOMY  1960   I & D EXTREMITY Right 06/19/2020   Procedure: IRRIGATION AND DEBRIDEMENT LONG FINGER;  Surgeon: Brunilda Capra, MD;  Location: [facility] OR;  Service: Orthopedics;  Laterality: Right;   LEFT HEART CATH  06/20/2018   ORIF FEMUR FRACTURE  1988   TOTAL KNEE ARTHROPLASTY Left 05/12/2023   Procedure: LEFT TOTAL KNEE ARTHROPLASTY;  Surgeon: Wes Hamman, MD;  Location: [facility] OR;  Service: Orthopedics;  Laterality: Left;   VARICOSE VEIN SURGERY Right    right leg   Patient Active Problem List   Diagnosis Date Noted   Status post total left knee replacement 05/12/2023   Post-traumatic osteoarthritis of left knee 01/31/2023   Atrial fibrillation with rapid ventricular response (HCC) 11/26/2021   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    Occlusion and stenosis of carotid artery without mention of cerebral infarction 11/19/2013    PCP: Beecher Bower, MD  REFERRING PROVIDER: Wes Hamman, MD  REFERRING DIAG: [phone] (ICD-10-CM) - H/O total knee replacement, left   THERAPY DIAG:  Chronic pain of left knee  Muscle weakness (generalized)  Stiffness of left knee, not elsewhere classified  Other abnormalities of gait and mobility  Unsteadiness on feet  Localized edema  Rationale for Evaluation and Treatment: Rehabilitation  ONSET DATE: 05/12/2023  Left TKA  SUBJECTIVE:   SUBJECTIVE STATEMENT: He relays he drove himself for first time today, did fine with this    PERTINENT HISTORY: Left TKA 05/12/23, OA Left femur fx (shaft just proximal to distal plate) ORIF 2130 & removal 01/05/22, A-Fib, CAD 4 stents, carotid artery disease, DM, HTN, HLD, Depression, aneurysm of infrarenal abdominal aorta, peripheral neuropathy  PAIN:  NPRS scale: Left knee today at rest & moving knee 2-3/10 and 1-10/10 this week Pain location: left knee more on medial / lateral joint line.  Pain description: dull ache, sore Aggravating factors: bend it, prolonged postures, rainy weather Relieving factors:  rubbing, pain meds, ice, elevation  PRECAUTIONS: None  WEIGHT BEARING RESTRICTIONS: No  FALLS:  Has patient fallen in last 6 months? No  LIVING ENVIRONMENT: Lives with: lives with their spouse and dog 55#   [place]ives [place] farm.  Tractor climb.  Lives in: House Stairs: Yes:  External: 5 steps; 2 rails can reach both. Has following equipment at home: Single point cane, Walker - 2 wheeled, Wheelchair (manual), [employer], and Grab bars  OCCUPATION: retired.  Works on farm. Climb tractor.  Handyman work.  PLOF: Independent  PATIENT GOALS:   work on his farm.  Walk around. Travel with RV (bumper pull) needs to set up.    Next MD visit: 06/27/2023  OBJECTIVE:  DIAGNOSTIC FINDINGS: 05/12/23 X-ray following TKA no  complications.  Patient-Specific Activity Scoring Scheme  "0" represents "unable to perform." "10" represents "able to perform at prior level. 0 1 2 3 4 5 6 7 8 9  10 (Date and Score)   Activity Eval  06/06/2023    1. Climb on tractor  0    2. Walk on paved surfaces 2     3. Walk on uneven like farm  2   4. Climb stairs  2   5. Carry items & standing ADLs 2   Score 1.6    Total score = sum of the activity scores/number of activities Minimum detectable change (90%CI) for average score = 2 points Minimum detectable change (90%CI) for single activity score = 3 points  COGNITION: 06/06/2023 Overall cognitive status: WFL    SENSATION: 06/06/2023 WFL  EDEMA:  Circumferential:   LLE: above knee 47.8cm,  around knee 45.5 cm, below knee 39.6 cm RLE: above knee 43.1 cm,  around knee 41.6 cm, below knee 35.4 cm  POSTURE: 06/06/2023   rounded shoulders, forward head, flexed trunk , and weight shift right  PALPATION: 06/06/2023 Tenderness around left knee joint line, quadriceps tendon and patella tendon.   Scar has decreased mobility especially over patella and tibia.   LOWER EXTREMITY ROM:   ROM Left Eval 06/06/2023 Left 06/12/23 Left 06/22/23  Hip flexion     Hip extension     Hip abduction     Hip adduction     Hip internal rotation     Hip external rotation     Knee flexion Seated A: 94* Supine A: 95* P: 99* Supine AROM heel slide  109 Seated A: 107* P: 111*  Knee extension Seated  LAQ -30* Supine  Quad set -9* P: -7* -3 in supine quad set   Ankle dorsiflexion     Ankle plantarflexion     Ankle inversion     Ankle eversion      (Blank rows = not tested)  LOWER EXTREMITY MMT:  MMT Left Eval 06/06/2023  Hip flexion   Hip extension   Hip abduction   Hip adduction   Hip internal rotation   Hip external rotation   Knee flexion 3-/5  Knee extension 3-/5  Ankle dorsiflexion   Ankle plantarflexion   Ankle inversion   Ankle eversion    (Blank rows  = not tested)  FUNCTIONAL TESTS:  06/06/2023 18 inch chair transfer: heavy use of BUEs on armrests with LLE decreased use.  GAIT: 06/06/2023 Distance walked: 100' Assistive device utilized: Environmental consultant - 2 wheeled Level of assistance: SBA / cues for deviations.  No balance issue with RW support.  Comments: excessive BUE weight bearing on RW,  left knee flexed in stance, minimal knee flexion and terminal stance and swing.                   TODAY'S TREATMENT  DATE: 06/27/2023 Therapeutic Exercise: Precor recumbent bike seat 9:  Gastroc stretch slantboard 30 sec X 3 bilat Seated LLE LAQ 3# with RLE active knee flexion at end range of LLE LAQ to facilitate stronger contraction and controlled slow eccentric 10 reps 2 sets  Therapeutic Activities: Tandem stance on floor eyes open 30 sec ea LLE in front & in back occasional intermittent touch; eyes closed with increased intermittent touch //bars; on foam eyes open with increased intermittent touch.   Step up & down using LLE 4" step with single rail 10 reps forward and 10 reps lateral Heel and toe raises 2X10 Leg press DL 08# [id number], single leg left leg only, 43# 2X10  Manual Therapy: PROM with overpressure for knee flexion    Vaso Left knee with elevation & ext stretch 34* medium compression for 10 min. TODAY'S TREATMENT                                                                          DATE: 06/22/2023 Therapeutic Exercise: Precor recumbent bike seat 9: rocking flexion stretch for 3.5 min;  full revolutions level 1 4.5 min. Gastroc stretch step heel depression 30 sec hold 3 reps with ea LE Seated LLE LAQ 3# with RLE active knee flexion at end range of LLE LAQ to facilitate stronger contraction and controlled slow eccentric 10 reps 2 sets  Therapeutic Activities: Tandem stance on floor eyes open 30 sec ea LLE in front & in back occasional intermittent touch; eyes  closed with increased intermittent touch //bars; on foam eyes open with increased intermittent touch.   Step up & down using LLE 4" step with single rail 10 reps;  PT demo & verbal cues on technique prior & during activity. Side Stepping over 6" hurdle with BUE support; then holding step position for 5 sec without UE support.  Alternating LEs. 5 reps with ea LE.  PT demo & verbal cues on technique prior & during activity. Sit to/from stand 18" chair using BUEs on seat (not armrests) 10 reps. Not touching RW to stabilize.  PT demo & verbal cues on technique prior & during activity.  Manual Therapy: PROM with overpressure for knee flexion with traction and tibial int rot.    Vaso Left knee with elevation & ext stretch 34* medium compression for 10 min.     TREATMENT                                                                          DATE: 06/19/2023 Therapeutic Exercise: Precor recumbent bike seat 9: full revolutions level 1 8 min.  PT recommended bike 10 min 3 sets with timed 5 min rests; once able to tolerate then begin to decrease rests by 1 min until 30 min straight. Pt verbalized understanding.  Hamstring stretch SLR with strap 30 sec hold 2 reps LLE Gastroc stretch step heel depression 30 sec hold  Heel raises BLEs 10 reps BUE support (minimal range)  Quad stretch hooklying strap flexion 30 sec hold 2 reps PT added stretches to HEP with HO.  Pt verbalized understanding.   Therapeutic Activities: Leg press BLEs 87# 15 reps 1 set;  marching 5 reps per LE 3 sets 75#;  LLE only 43# 10 reps 2 sets; Tandem stance on floor LLE in front & in back 30 sec 2 reps ea.   Self-Care Scar mobilizations with PT educating pt while PT performing (added to HO). Pt verbalized understanding.  PT educated on not taking Oxy on empty stomach, signs of dehydration & relationship to "light-headedness"  pt verbalized understanding.   Manual Therapy: PROM with overpressure for knee flexion with traction and  tibial int rot.    Vaso Left knee with elevation & ext stretch 34* medium compression for 10 min.     PATIENT EDUCATION:  Education details: HEP, POC Person educated: Patient Education method: Programmer, multimedia, Demonstration, Verbal cues, and Handouts Education comprehension: verbalized understanding, returned demonstration, and verbal cues required  HOME EXERCISE PROGRAM: Access Code: [email or web address] URL: [email or web address] Date: 06/19/2023 Prepared by: Lorie Rook  Exercises - Ankle Alphabet in Elevation  - 2-4 x daily - 7 x weekly - 1 sets - 1 reps - Quad Setting and Stretching  - 2-4 x daily - 7 x weekly - 5-10 sets - 10 reps - prop 5-10 minutes & quad set5 seconds hold - Supine Heel Slide with Strap  - 2-3 x daily - 7 x weekly - 2-3 sets - 10 reps - 5 seconds hold - Supine Straight Leg Raises  - 2-3 x daily - 7 x weekly - 2-3 sets - 10 reps - 5 seconds hold - Seated Knee Flexion Extension AROM   - 2-4 x daily - 7 x weekly - 2-3 sets - 10 reps - 5 seconds hold - Seated straight leg lifts  - 2-3 x daily - 7 x weekly - 2-3 sets - 10 reps - 5 seconds hold - Tandem Stance  - 1 x daily - 7 x weekly - 1 sets - 5 reps - 20 second hold - Small Range Straight Leg Raise  - 2 x daily - 7 x weekly - 3-5 sets - 5 reps - 3 seconds hold - Hooklying Hamstring Stretch with Strap  - 1-2 x daily - 7 x weekly - 1 sets - 2-3 reps - 30 seconds hold - Gastroc Stretch on Step  - 1-2 x daily - 7 x weekly - 1 sets - 2-3 reps - 30 seconds hold - Supine Quadriceps Stretch with Strap on Table  - 1-2 x daily - 7 x weekly - 1 sets - 2-3 reps - 30 seconds hold  Patient Education - Scar Massage    ASSESSMENT: CLINICAL IMPRESSION: Knee ROM is coming along well but knee strength is coming a long a little more slowly. He will continue to benefit from further strength work to improve function.  OBJECTIVE IMPAIRMENTS: Abnormal gait, decreased activity tolerance, decreased balance, decreased endurance,  decreased knowledge of condition, decreased knowledge of use of DME, decreased mobility, difficulty walking, decreased ROM, decreased strength, increased edema, postural dysfunction, and pain.   ACTIVITY LIMITATIONS: carrying, lifting, bending, standing, squatting, sleeping, stairs, transfers, locomotion level, and climbing on farm equipment  PARTICIPATION LIMITATIONS: meal prep, cleaning, laundry, driving, community activity, occupation, yard work, and travel  PERSONAL FACTORS: Fitness, Past/current experiences, Time since onset of injury/illness/exacerbation, and 3+ comorbidities: see PMH  are also affecting patient's functional outcome.   REHAB POTENTIAL: Good  CLINICAL  DECISION MAKING: Stable/uncomplicated  EVALUATION COMPLEXITY: Low   GOALS: Goals reviewed with patient? Yes  SHORT TERM GOALS: (target date for Short term goals 07/06/2023)   1.  Patient will demonstrate independent use of home exercise program to maintain progress from in clinic treatments. Baseline: See objective data Goal status: Ongoing   06/22/2023  2. PROM 0* ext to 110* flexion Baseline: See objective data Goal status: Ongoing   06/22/2023  3. Interim PSFS average >4/10 Baseline: See objective data Goal status: Ongoing   06/22/2023  LONG TERM GOALS: (target dates for all long term goals 08/02/2023   )   1. Patient will demonstrate/report pain at worst less than or equal to 2/10 to facilitate minimal limitation in daily activity secondary to pain symptoms. Baseline: See objective data Goal status: Ongoing  06/22/2023   2. Patient will demonstrate independent use of home exercise program to facilitate ability to maintain/progress functional gains from skilled physical therapy services. Baseline: See objective data Goal status: Ongoing   06/22/2023  3.  Patient reports Patient-Specific Activity Score improved the average by 5 to indicate improvement in functional activities.  Baseline: SEE OBJECTIVE  DATA Goal status: Ongoing   06/22/2023   4.  Patient will demonstrate LLE MMT 5/5 throughout to faciltiate usual transfers, stairs, squatting at [facility] for daily life.  Baseline: See objective data Goal status: Ongoing   06/22/2023   5.  Patient left knee AROM 0* ext to 110* flexion.  Baseline: See objective data Goal status: Ongoing   06/22/2023   6. Left knee PROM 0* to 120* Baseline: See objective data Goal status: Ongoing   06/22/2023   7.  patient ambulates >500', neg ramps, curbs and stairs 1 rail independently.  Baseline: See objective data Goal status: Ongoing  06/22/2023  PLAN:  PT FREQUENCY:  2x/week  PT DURATION: 8 weeks  PLANNED INTERVENTIONS: 97164- PT Re-evaluation, 97110-Therapeutic exercises, 97530- Therapeutic activity, 97112- Neuromuscular re-education, 97535- Self Care, 56213- Manual therapy, [phone]- Gait training, [phone]- Aquatic Therapy, [phone]- Electrical stimulation (unattended), [phone]- Electrical stimulation (manual), 97016- Vasopneumatic device, Patient/Family education, Balance training, Stair training, Taping, Dry Needling, Joint mobilization, Scar mobilization, Vestibular training, DME instructions, Cryotherapy, Moist heat, and physical performance testing.   PLAN FOR NEXT SESSION:  continue with progressive exercises for Strength & balance gains.   [facility] 15 visit authorized.    Mick Alamin, PT, DPT 06/27/2023, 11:28 AM

## 2023-06-30 ENCOUNTER — Ambulatory Visit: Admitting: Rehabilitative and Restorative Service Providers"

## 2023-06-30 ENCOUNTER — Encounter: Payer: Self-pay | Admitting: Rehabilitative and Restorative Service Providers"

## 2023-06-30 DIAGNOSIS — R2689 Other abnormalities of gait and mobility: Secondary | ICD-10-CM

## 2023-06-30 DIAGNOSIS — R2681 Unsteadiness on feet: Secondary | ICD-10-CM

## 2023-06-30 DIAGNOSIS — M25662 Stiffness of left knee, not elsewhere classified: Secondary | ICD-10-CM | POA: Diagnosis not present

## 2023-06-30 DIAGNOSIS — M25562 Pain in left knee: Secondary | ICD-10-CM | POA: Diagnosis not present

## 2023-06-30 DIAGNOSIS — G8929 Other chronic pain: Secondary | ICD-10-CM

## 2023-06-30 DIAGNOSIS — R600 Localized edema: Secondary | ICD-10-CM

## 2023-06-30 DIAGNOSIS — M6281 Muscle weakness (generalized): Secondary | ICD-10-CM

## 2023-06-30 NOTE — Therapy (Signed)
 OUTPATIENT PHYSICAL THERAPY TREATMENT NOTE   Patient Name: Jeffrey [name] MRN: [id number] DOB:1955-04-30, 68 y.o., male Today's Date: 06/30/2023  END OF SESSION:  PT End of Session - 06/30/23 1016     Visit Number 8    Number of Visits 15    Date for PT Re-Evaluation 08/03/23    Authorization Type [facility]    Authorization Time Period 15 visits  2/14-8/13/25    Authorization - Number of Visits 15    Progress Note Due on Visit 10    PT Start Time 1016    PT Stop Time 1110    PT Time Calculation (min) 54 min    Activity Tolerance Patient tolerated treatment well;No increased pain    Behavior During Therapy WFL for tasks assessed/performed              Past Medical History:  Diagnosis Date   Aneurysm of infrarenal abdominal aorta (HCC)    CAD (coronary artery disease)    4 stents   Carotid artery disease (HCC)    Left ICA   Diabetes mellitus without complication (HCC)    Dysrhythmia    A. Fib   Hyperlipidemia    Intracerebral hemorrhage (HCC) 1960   Peripheral neuropathy    Post-traumatic osteoarthritis of left knee    Past Surgical History:  Procedure Laterality Date   APPENDECTOMY  1960   I & D EXTREMITY Right 06/19/2020   Procedure: IRRIGATION AND DEBRIDEMENT LONG FINGER;  Surgeon: Brunilda Capra, MD;  Location: [facility] OR;  Service: Orthopedics;  Laterality: Right;   LEFT HEART CATH  06/20/2018   ORIF FEMUR FRACTURE  1988   TOTAL KNEE ARTHROPLASTY Left 05/12/2023   Procedure: LEFT TOTAL KNEE ARTHROPLASTY;  Surgeon: Wes Hamman, MD;  Location: [facility] OR;  Service: Orthopedics;  Laterality: Left;   VARICOSE VEIN SURGERY Right    right leg   Patient Active Problem List   Diagnosis Date Noted   Status post total left knee replacement 05/12/2023   Post-traumatic osteoarthritis of left knee 01/31/2023   Atrial fibrillation with rapid ventricular response (HCC) 11/26/2021   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   Occlusion and stenosis of carotid artery without mention of  cerebral infarction 11/19/2013    PCP: Beecher Bower, MD  REFERRING PROVIDER: Wes Hamman, MD  REFERRING DIAG: [phone] (ICD-10-CM) - H/O total knee replacement, left   THERAPY DIAG:  Chronic pain of left knee  Muscle weakness (generalized)  Stiffness of left knee, not elsewhere classified  Other abnormalities of gait and mobility  Unsteadiness on feet  Localized edema  Rationale for Evaluation and Treatment: Rehabilitation  ONSET DATE: 05/12/2023  Left TKA  SUBJECTIVE:   SUBJECTIVE STATEMENT: Jeffrey [name] is taking 3 prescription pain pills per day, 2 before bed and 1 in the AM.  He reports good HEP compliance.  PERTINENT HISTORY: Left TKA 05/12/23, OA Left femur fx (shaft just proximal to distal plate) ORIF 9147 & removal 01/05/22, A-Fib, CAD 4 stents, carotid artery disease, DM, HTN, HLD, Depression, aneurysm of infrarenal abdominal aorta, peripheral neuropathy  PAIN:  NPRS scale: Left knee 2 - 7/10 this week Pain location: left knee more on medial / lateral joint line.  Pain description: dull ache, sore Aggravating factors: bend it, prolonged postures, rainy weather, overuse Relieving factors:  rubbing, pain meds, ice, elevation  PRECAUTIONS: None  WEIGHT BEARING RESTRICTIONS: No  FALLS:  Has patient fallen in last 6 months? No  LIVING ENVIRONMENT: Lives with: lives with their spouse and  dog 55#   [place] farm.  Tractor climb.  Lives in: House Stairs: Yes:  External: 5 steps; 2 rails can reach both. Has following equipment at home: Single point cane, Walker - 2 wheeled, Wheelchair (manual), [employer], and Grab bars  OCCUPATION: retired.  Works on farm. Climb tractor.  Handyman work.  PLOF: Independent  PATIENT GOALS:   work on his farm.  Walk around. Travel with RV (bumper pull) needs to set up.    Next MD visit: 06/27/2023  OBJECTIVE:  DIAGNOSTIC FINDINGS: 05/12/23 X-ray following TKA no complications.  Patient-Specific Activity Scoring Scheme  "0"  represents "unable to perform." "10" represents "able to perform at prior level. 0 1 2 3 4 5 6 7 8 9  10 (Date and Score)   Activity Eval  06/06/2023 06/30/2023   1. Climb on tractor  0 0   2. Walk on paved surfaces 2  5   3. Walk on uneven like farm  2 3  4. Climb stairs  2 3  5. Carry items & standing ADLs 2 3  Score 1.6 14/50 or 2.8   Total score = sum of the activity scores/number of activities Minimum detectable change (90%CI) for average score = 2 points Minimum detectable change (90%CI) for single activity score = 3 points  COGNITION: 06/06/2023 Overall cognitive status: WFL    SENSATION: 06/06/2023 WFL  EDEMA:  Circumferential:   LLE: above knee 47.8cm,  around knee 45.5 cm, below knee 39.6 cm RLE: above knee 43.1 cm,  around knee 41.6 cm, below knee 35.4 cm  POSTURE: 06/06/2023   rounded shoulders, forward head, flexed trunk , and weight shift right  PALPATION: 06/06/2023 Tenderness around left knee joint line, quadriceps tendon and patella tendon.   Scar has decreased mobility especially over patella and tibia.   LOWER EXTREMITY ROM:   ROM Left Eval 06/06/2023 Left 06/12/23 Left 06/22/23 Left 06/30/2023  Hip flexion      Hip extension      Hip abduction      Hip adduction      Hip internal rotation      Hip external rotation      Knee flexion Seated A: 94* Supine A: 95* P: 99* Supine AROM heel slide  109 Seated A: 107* P: 111* 105 Active  Knee extension Seated  LAQ -30* Supine  Quad set -9* P: -7* -3 in supine quad set  -1 Active  Ankle dorsiflexion      Ankle plantarflexion      Ankle inversion      Ankle eversion       (Blank rows = not tested)  LOWER EXTREMITY MMT:  MMT Left Eval 06/06/2023  Hip flexion   Hip extension   Hip abduction   Hip adduction   Hip internal rotation   Hip external rotation   Knee flexion 3-/5  Knee extension 3-/5  Ankle dorsiflexion   Ankle plantarflexion   Ankle inversion   Ankle eversion     (Blank rows = not tested)  FUNCTIONAL TESTS:  06/06/2023 18 inch chair transfer: heavy use of BUEs on armrests with LLE decreased use.  GAIT: 06/06/2023 Distance walked: 100' Assistive device utilized: Environmental consultant - 2 wheeled Level of assistance: SBA / cues for deviations.  No balance issue with RW support.  Comments: excessive BUE weight bearing on RW,  left knee flexed in stance, minimal knee flexion and terminal stance and swing.  TODAY'S TREATMENT                                                                          DATE: 06/30/2023 Recumbent bike Seat 11 for 8 minutes, Level 4 Seated straight leg raises 3 sets of 5 for 3 seconds, watch for quad lag  Functional Activities: Double Leg Press 87# full extension to full flexion 15 x slow eccentrics Single Leg Press 43# full extension to full flexion 10 x slow eccentrics  Neuromuscular re-education: Tandem balance: Eyes open 6 x 20 seconds each, encourage step strategy  Vaso Left Knee High 10 minutes 34*   TODAY'S TREATMENT                                                                          DATE: 06/27/2023 Therapeutic Exercise: Precor recumbent bike seat 9:  Gastroc stretch slantboard 30 sec X 3 bilat Seated LLE LAQ 3# with RLE active knee flexion at end range of LLE LAQ to facilitate stronger contraction and controlled slow eccentric 10 reps 2 sets  Therapeutic Activities: Tandem stance on floor eyes open 30 sec ea LLE in front & in back occasional intermittent touch; eyes closed with increased intermittent touch //bars; on foam eyes open with increased intermittent touch.   Step up & down using LLE 4" step with single rail 10 reps forward and 10 reps lateral Heel and toe raises 2X10 Leg press DL 16# 1W96, single leg left leg only, 43# 2X10  Manual Therapy: PROM with overpressure for knee flexion    Vaso Left knee with elevation & ext stretch 34* medium compression for 10 min.   TODAY'S TREATMENT                                                                           DATE: 06/22/2023 Therapeutic Exercise: Precor recumbent bike seat 9: rocking flexion stretch for 3.5 min;  full revolutions level 1 4.5 min. Gastroc stretch step heel depression 30 sec hold 3 reps with ea LE Seated LLE LAQ 3# with RLE active knee flexion at end range of LLE LAQ to facilitate stronger contraction and controlled slow eccentric 10 reps 2 sets  Therapeutic Activities: Tandem stance on floor eyes open 30 sec ea LLE in front & in back occasional intermittent touch; eyes closed with increased intermittent touch //bars; on foam eyes open with increased intermittent touch.   Step up & down using LLE 4" step with single rail 10 reps;  PT demo & verbal cues on technique prior & during activity. Side Stepping over 6" hurdle with BUE support; then holding step position for 5 sec without UE support.  Alternating LEs. 5 reps  with ea LE.  PT demo & verbal cues on technique prior & during activity. Sit to/from stand 18" chair using BUEs on seat (not armrests) 10 reps. Not touching RW to stabilize.  PT demo & verbal cues on technique prior & during activity.  Manual Therapy: PROM with overpressure for knee flexion with traction and tibial int rot.    Vaso Left knee with elevation & ext stretch 34* medium compression for 10 min.   PATIENT EDUCATION:  Education details: HEP, POC Person educated: Patient Education method: Programmer, multimedia, Demonstration, Verbal cues, and Handouts Education comprehension: verbalized understanding, returned demonstration, and verbal cues required  HOME EXERCISE PROGRAM: Access Code: [email or web address] URL: [email or web address] Date: 06/19/2023 Prepared by: Lorie Rook  Exercises - Ankle Alphabet in Elevation  - 2-4 x daily - 7 x weekly - 1 sets - 1 reps - Quad Setting and Stretching  - 2-4 x daily - 7 x weekly - 5-10 sets - 10 reps - prop 5-10 minutes & quad set5 seconds hold - Supine  Heel Slide with Strap  - 2-3 x daily - 7 x weekly - 2-3 sets - 10 reps - 5 seconds hold - Supine Straight Leg Raises  - 2-3 x daily - 7 x weekly - 2-3 sets - 10 reps - 5 seconds hold - Seated Knee Flexion Extension AROM   - 2-4 x daily - 7 x weekly - 2-3 sets - 10 reps - 5 seconds hold - Seated straight leg lifts  - 2-3 x daily - 7 x weekly - 2-3 sets - 10 reps - 5 seconds hold - Tandem Stance  - 1 x daily - 7 x weekly - 1 sets - 5 reps - 20 second hold - Small Range Straight Leg Raise  - 2 x daily - 7 x weekly - 3-5 sets - 5 reps - 3 seconds hold - Hooklying Hamstring Stretch with Strap  - 1-2 x daily - 7 x weekly - 1 sets - 2-3 reps - 30 seconds hold - Gastroc Stretch on Step  - 1-2 x daily - 7 x weekly - 1 sets - 2-3 reps - 30 seconds hold - Supine Quadriceps Stretch with Strap on Table  - 1-2 x daily - 7 x weekly - 1 sets - 2-3 reps - 30 seconds hold  Patient Education - Scar Massage    ASSESSMENT: CLINICAL IMPRESSION: Jasmin is making good progress towards long-term goals established at evaluation.  Active range of motion was objectively assessed at 1 - 0 - 105 degrees today.  Continue to work on quadriceps strength, balance, gait and function including stairs should allow Rein to be independent without using his walker and transfer into independent rehabilitation within the recommended plan of care.  OBJECTIVE IMPAIRMENTS: Abnormal gait, decreased activity tolerance, decreased balance, decreased endurance, decreased knowledge of condition, decreased knowledge of use of DME, decreased mobility, difficulty walking, decreased ROM, decreased strength, increased edema, postural dysfunction, and pain.   ACTIVITY LIMITATIONS: carrying, lifting, bending, standing, squatting, sleeping, stairs, transfers, locomotion level, and climbing on farm equipment  PARTICIPATION LIMITATIONS: meal prep, cleaning, laundry, driving, community activity, occupation, yard work, and travel  PERSONAL FACTORS:  Fitness, Past/current experiences, Time since onset of injury/illness/exacerbation, and 3+ comorbidities: see PMH  are also affecting patient's functional outcome.   REHAB POTENTIAL: Good  CLINICAL DECISION MAKING: Stable/uncomplicated  EVALUATION COMPLEXITY: Low   GOALS: Goals reviewed with patient? Yes  SHORT TERM GOALS: (target date for Short term goals 07/06/2023)   1.  Patient will demonstrate independent use of home exercise program to maintain progress from in clinic treatments. Baseline: See objective data Goal status: Met 06/30/2023  2. PROM 0* ext to 110* flexion Baseline: See objective data Goal status: Ongoing   06/30/2023  3. Interim PSFS average >4/10 Baseline: See objective data Goal status: Ongoing   06/30/2023  LONG TERM GOALS: (target dates for all long term goals 08/02/2023   )   1. Patient will demonstrate/report pain at worst less than or equal to 2/10 to facilitate minimal limitation in daily activity secondary to pain symptoms. Baseline: See objective data Goal status: Ongoing  06/30/2023   2. Patient will demonstrate independent use of home exercise program to facilitate ability to maintain/progress functional gains from skilled physical therapy services. Baseline: See objective data Goal status: Ongoing   06/30/2023  3.  Patient reports Patient-Specific Activity Score improved the average by 5 to indicate improvement in functional activities.  Baseline: SEE OBJECTIVE DATA Goal status: Ongoing   06/30/2023   4.  Patient will demonstrate LLE MMT 5/5 throughout to faciltiate usual transfers, stairs, squatting at [facility] for daily life.  Baseline: See objective data Goal status: Ongoing   06/30/2023   5.  Patient left knee AROM 0* ext to 110* flexion.  Baseline: See objective data Goal status: Ongoing   06/30/2023   6. Left knee PROM 0* to 120* Baseline: See objective data Goal status: Ongoing   06/30/2023   7.  patient ambulates >500', neg ramps, curbs and  stairs 1 rail independently.  Baseline: See objective data Goal status: Ongoing  06/30/2023  PLAN:  PT FREQUENCY:  2x/week  PT DURATION: 8 weeks  PLANNED INTERVENTIONS: 97164- PT Re-evaluation, 97110-Therapeutic exercises, 97530- Therapeutic activity, 97112- Neuromuscular re-education, 97535- Self Care, 28413- Manual therapy, [phone]- Gait training, [phone]- Aquatic Therapy, [phone]- Electrical stimulation (unattended), [phone]- Electrical stimulation (manual), 97016- Vasopneumatic device, Patient/Family education, Balance training, Stair training, Taping, Dry Needling, Joint mobilization, Scar mobilization, Vestibular training, DME instructions, Cryotherapy, Moist heat, and physical performance testing.   PLAN FOR NEXT SESSION: Quadriceps strengthening, balance and gait activities to make him safe with transition to a cane and functional activities like stairs when appropriate.  [facility] 15 visit authorized.    Joli Neas, PT, MPT 06/30/2023, 4:51 PM

## 2023-07-03 ENCOUNTER — Ambulatory Visit (INDEPENDENT_AMBULATORY_CARE_PROVIDER_SITE_OTHER): Admitting: Rehabilitative and Restorative Service Providers"

## 2023-07-03 ENCOUNTER — Encounter: Payer: Self-pay | Admitting: Rehabilitative and Restorative Service Providers"

## 2023-07-03 DIAGNOSIS — G8929 Other chronic pain: Secondary | ICD-10-CM

## 2023-07-03 DIAGNOSIS — M25662 Stiffness of left knee, not elsewhere classified: Secondary | ICD-10-CM

## 2023-07-03 DIAGNOSIS — M25562 Pain in left knee: Secondary | ICD-10-CM | POA: Diagnosis not present

## 2023-07-03 DIAGNOSIS — M6281 Muscle weakness (generalized): Secondary | ICD-10-CM | POA: Diagnosis not present

## 2023-07-03 DIAGNOSIS — R2689 Other abnormalities of gait and mobility: Secondary | ICD-10-CM | POA: Diagnosis not present

## 2023-07-03 DIAGNOSIS — R2681 Unsteadiness on feet: Secondary | ICD-10-CM

## 2023-07-03 DIAGNOSIS — R600 Localized edema: Secondary | ICD-10-CM

## 2023-07-03 NOTE — Therapy (Signed)
 OUTPATIENT PHYSICAL THERAPY TREATMENT NOTE   Patient Name: Jeffrey [name] MRN: [id number] DOB:Aug 04, 1955, 68 y.o., male Today's Date: 07/03/2023  END OF SESSION:  PT End of Session - 07/03/23 0848     Visit Number 9    Number of Visits 15    Date for PT Re-Evaluation 08/03/23    Authorization Type [facility]    Authorization Time Period 15 visits  2/14-8/13/25    Authorization - Visit Number 9    Authorization - Number of Visits 15    Progress Note Due on Visit 10    PT Start Time 0840    PT Stop Time 0929    PT Time Calculation (min) 49 min    Activity Tolerance Patient tolerated treatment well    Behavior During Therapy WFL for tasks assessed/performed               Past Medical History:  Diagnosis Date   Aneurysm of infrarenal abdominal aorta (HCC)    CAD (coronary artery disease)    4 stents   Carotid artery disease (HCC)    Left ICA   Diabetes mellitus without complication (HCC)    Dysrhythmia    A. Fib   Hyperlipidemia    Intracerebral hemorrhage (HCC) 1960   Peripheral neuropathy    Post-traumatic osteoarthritis of left knee    Past Surgical History:  Procedure Laterality Date   APPENDECTOMY  1960   I & D EXTREMITY Right 06/19/2020   Procedure: IRRIGATION AND DEBRIDEMENT LONG FINGER;  Surgeon: Brunilda Capra, MD;  Location: [facility] OR;  Service: Orthopedics;  Laterality: Right;   LEFT HEART CATH  06/20/2018   ORIF FEMUR FRACTURE  1988   TOTAL KNEE ARTHROPLASTY Left 05/12/2023   Procedure: LEFT TOTAL KNEE ARTHROPLASTY;  Surgeon: Wes Hamman, MD;  Location: [facility] OR;  Service: Orthopedics;  Laterality: Left;   VARICOSE VEIN SURGERY Right    right leg   Patient Active Problem List   Diagnosis Date Noted   Status post total left knee replacement 05/12/2023   Post-traumatic osteoarthritis of left knee 01/31/2023   Atrial fibrillation with rapid ventricular response (HCC) 11/26/2021   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   Occlusion and stenosis of carotid artery  without mention of cerebral infarction 11/19/2013    PCP: Beecher Bower, MD  REFERRING PROVIDER: Wes Hamman, MD  REFERRING DIAG: [phone] (ICD-10-CM) - H/O total knee replacement, left   THERAPY DIAG:  Chronic pain of left knee  Muscle weakness (generalized)  Stiffness of left knee, not elsewhere classified  Other abnormalities of gait and mobility  Unsteadiness on feet  Localized edema  Rationale for Evaluation and Treatment: Rehabilitation  ONSET DATE: 05/12/2023  Left TKA  SUBJECTIVE:   SUBJECTIVE STATEMENT: Pt indicated doing some short distances walking with cane.  Pt did indicate having some pain on lateral inferior joint line/fibular area.  Pt indicated going fishing yesterday. Pt indicated feeling knee kick back at times in walking.   PERTINENT HISTORY: Left TKA 05/12/23, OA Left femur fx (shaft just proximal to distal plate) ORIF 9147 & removal 01/05/22, A-Fib, CAD 4 stents, carotid artery disease, DM, HTN, HLD, Depression, aneurysm of infrarenal abdominal aorta, peripheral neuropathy  PAIN:  NPRS scale:4-5/10 this morning.  Pain location: left knee more on medial / lateral joint line.  Pain description: dull ache, sore, Aggravating factors: WB pressure, consistent symptoms  Relieving factors:  rubbing, pain meds, ice, elevation  PRECAUTIONS: None  WEIGHT BEARING RESTRICTIONS: No  FALLS:  Has  patient fallen in last 6 months? No  LIVING ENVIRONMENT: Lives with: lives with their spouse and dog 55#   lives [place] farm.  Tractor climb.  Lives in: House Stairs: Yes:  External: 5 steps; 2 rails can reach both. Has following equipment at home: Single point cane, Walker - 2 wheeled, Wheelchair (manual), [employer], and Grab bars  OCCUPATION: retired.  Works on farm. Climb tractor.  Handyman work.  PLOF: Independent  PATIENT GOALS:   work on his farm.  Walk around. Travel with RV (bumper pull) needs to set up.    Next MD visit: 06/27/2023  OBJECTIVE:   DIAGNOSTIC FINDINGS: 05/12/23 X-ray following TKA no complications.  Patient-Specific Activity Scoring Scheme  "0" represents "unable to perform." "10" represents "able to perform at prior level. 0 1 2 3 4 5 6 7 8 9  10 (Date and Score)   Activity Eval  06/06/2023 06/30/2023   1. Climb on tractor  0 0   2. Walk on paved surfaces 2  5   3. Walk on uneven like farm  2 3  4. Climb stairs  2 3  5. Carry items & standing ADLs 2 3  Score 1.6 2.8 avg   Total score = sum of the activity scores/number of activities Minimum detectable change (90%CI) for average score = 2 points Minimum detectable change (90%CI) for single activity score = 3 points  COGNITION: 06/06/2023 Overall cognitive status: WFL    SENSATION: 06/06/2023 WFL  EDEMA:  Circumferential:   LLE: above knee 47.8cm,  around knee 45.5 cm, below knee 39.6 cm RLE: above knee 43.1 cm,  around knee 41.6 cm, below knee 35.4 cm  POSTURE: 06/06/2023   rounded shoulders, forward head, flexed trunk , and weight shift right  PALPATION: 06/06/2023 Tenderness around left knee joint line, quadriceps tendon and patella tendon.   Scar has decreased mobility especially over patella and tibia.   LOWER EXTREMITY ROM:   ROM Left Eval 06/06/2023 Left 06/12/23 Left 06/22/23 Left 06/30/2023  Hip flexion      Hip extension      Hip abduction      Hip adduction      Hip internal rotation      Hip external rotation      Knee flexion Seated A: 94* Supine A: 95* P: 99* Supine AROM heel slide  109 Seated A: 107* P: 111* 105 Active  Knee extension Seated  LAQ -30* Supine  Quad set -9* P: -7* -3 in supine quad set  -1 Active  Ankle dorsiflexion      Ankle plantarflexion      Ankle inversion      Ankle eversion       (Blank rows = not tested)  LOWER EXTREMITY MMT:  MMT Left Eval 06/06/2023  Hip flexion   Hip extension   Hip abduction   Hip adduction   Hip internal rotation   Hip external rotation   Knee flexion 3-/5   Knee extension 3-/5  Ankle dorsiflexion   Ankle plantarflexion   Ankle inversion   Ankle eversion    (Blank rows = not tested)  FUNCTIONAL TESTS:  06/06/2023 18 inch chair transfer: heavy use of BUEs on armrests with LLE decreased use.  GAIT: 07/03/2023: Arrived c FWW into clinic.  Able to perform household distances in clinic with SPC in Rt UE with supervision.   06/06/2023 Distance walked: 100' Assistive device utilized: Environmental consultant - 2 wheeled Level of assistance: SBA / cues for deviations.  No balance issue with RW support.  Comments: excessive BUE weight bearing on RW,  left knee flexed in stance, minimal knee flexion and terminal stance and swing.                    TODAY'S TREATMENT                                                                          DATE:07/03/2023 Therapeutic Exercise: Recumbent bike seat 9 for 4 mins, seat 8 for 4 mins for ROM.  Lvl 2 8 mins  Incline gastroc stretch 30 sec x 3 bilateral  Seated Lt leg LAQ with end range pauses 2 x 15 4 lbs   Therapeutic Activities: (to improve progressive mobility, squatting, tranfers, ambulation, stairs) Step on fwd, reverse step off with WB on Lt leg 4 inch step x 15 with Rt hand on bar.  Sit to stand to sit with chair and airex foam with light hand use upon standing and focus on slow lowering without hands x 10   SPC use in clinic with cane in Rt hand - household distances within clinic between activity  Neuro Reed (to improve neuromuscular recruitment, balance) Standing TKE blue band 5 sec hold x 15 Lt leg in // bars with hand assist on bars Tandem stance on foam 1 min x 2 bilateral   Vasopneumatic Lt knee high pressure 34 deg in elevation.    TODAY'S TREATMENT                                                                          DATE:06/30/2023 Recumbent bike Seat 11 for 8 minutes, Level 4 Seated straight leg raises 3 sets of 5 for 3 seconds, watch for quad lag  Functional Activities: Double Leg Press  87# full extension to full flexion 15 x slow eccentrics Single Leg Press 43# full extension to full flexion 10 x slow eccentrics  Neuromuscular re-education: Tandem balance: Eyes open 6 x 20 seconds each, encourage step strategy  Vaso Left Knee High 10 minutes 34*   TODAY'S TREATMENT                                                                          DATE:06/27/2023 Therapeutic Exercise: Precor recumbent bike seat 9:  Gastroc stretch slantboard 30 sec X 3 bilat Seated LLE LAQ 3# with RLE active knee flexion at end range of LLE LAQ to facilitate stronger contraction and controlled slow eccentric 10 reps 2 sets  Therapeutic Activities: Tandem stance on floor eyes open 30 sec ea LLE in front & in back occasional intermittent touch; eyes closed with increased intermittent touch //bars; on foam eyes open  with increased intermittent touch.   Step up & down using LLE 4" step with single rail 10 reps forward and 10 reps lateral Heel and toe raises 2X10 Leg press DL 16# 1W96, single leg left leg only, 43# 2X10  Manual Therapy: PROM with overpressure for knee flexion    Vaso Left knee with elevation & ext stretch 34* medium compression for 10 min.   TODAY'S TREATMENT                                                                          DATE:06/22/2023 Therapeutic Exercise: Precor recumbent bike seat 9: rocking flexion stretch for 3.5 min;  full revolutions level 1 4.5 min. Gastroc stretch step heel depression 30 sec hold 3 reps with ea LE Seated LLE LAQ 3# with RLE active knee flexion at end range of LLE LAQ to facilitate stronger contraction and controlled slow eccentric 10 reps 2 sets  Therapeutic Activities: Tandem stance on floor eyes open 30 sec ea LLE in front & in back occasional intermittent touch; eyes closed with increased intermittent touch //bars; on foam eyes open with increased intermittent touch.   Step up & down using LLE 4" step with single rail 10 reps;  PT demo &  verbal cues on technique prior & during activity. Side Stepping over 6" hurdle with BUE support; then holding step position for 5 sec without UE support.  Alternating LEs. 5 reps with ea LE.  PT demo & verbal cues on technique prior & during activity. Sit to/from stand 18" chair using BUEs on seat (not armrests) 10 reps. Not touching RW to stabilize.  PT demo & verbal cues on technique prior & during activity.  Manual Therapy: PROM with overpressure for knee flexion with traction and tibial int rot.    Vaso Left knee with elevation & ext stretch 34* medium compression for 10 min.   PATIENT EDUCATION:  Education details: HEP, POC Person educated: Patient Education method: Programmer, multimedia, Demonstration, Verbal cues, and Handouts Education comprehension: verbalized understanding, returned demonstration, and verbal cues required  HOME EXERCISE PROGRAM: Access Code: [email or web address] URL: [email or web address] Date: 06/19/2023 Prepared by: Lorie Rook  Exercises - Ankle Alphabet in Elevation  - 2-4 x daily - 7 x weekly - 1 sets - 1 reps - Quad Setting and Stretching  - 2-4 x daily - 7 x weekly - 5-10 sets - 10 reps - prop 5-10 minutes & quad set5 seconds hold - Supine Heel Slide with Strap  - 2-3 x daily - 7 x weekly - 2-3 sets - 10 reps - 5 seconds hold - Supine Straight Leg Raises  - 2-3 x daily - 7 x weekly - 2-3 sets - 10 reps - 5 seconds hold - Seated Knee Flexion Extension AROM   - 2-4 x daily - 7 x weekly - 2-3 sets - 10 reps - 5 seconds hold - Seated straight leg lifts  - 2-3 x daily - 7 x weekly - 2-3 sets - 10 reps - 5 seconds hold - Tandem Stance  - 1 x daily - 7 x weekly - 1 sets - 5 reps - 20 second hold - Small Range Straight Leg Raise  - 2 x daily - 7 x  weekly - 3-5 sets - 5 reps - 3 seconds hold - Hooklying Hamstring Stretch with Strap  - 1-2 x daily - 7 x weekly - 1 sets - 2-3 reps - 30 seconds hold - Gastroc Stretch on Step  - 1-2 x daily - 7 x weekly - 1 sets - 2-3  reps - 30 seconds hold - Supine Quadriceps Stretch with Strap on Table  - 1-2 x daily - 7 x weekly - 1 sets - 2-3 reps - 30 seconds hold  Patient Education - Scar Massage    ASSESSMENT: CLINICAL IMPRESSION: Quad weakness in WB activity still present, noted in step up loading in particular today.  Continued strengthening and balance control to continue to improve functional movement performance.  Continued skilled PT services recommended.    OBJECTIVE IMPAIRMENTS: Abnormal gait, decreased activity tolerance, decreased balance, decreased endurance, decreased knowledge of condition, decreased knowledge of use of DME, decreased mobility, difficulty walking, decreased ROM, decreased strength, increased edema, postural dysfunction, and pain.   ACTIVITY LIMITATIONS: carrying, lifting, bending, standing, squatting, sleeping, stairs, transfers, locomotion level, and climbing on farm equipment  PARTICIPATION LIMITATIONS: meal prep, cleaning, laundry, driving, community activity, occupation, yard work, and travel  PERSONAL FACTORS: Fitness, Past/current experiences, Time since onset of injury/illness/exacerbation, and 3+ comorbidities: see PMH  are also affecting patient's functional outcome.   REHAB POTENTIAL: Good  CLINICAL DECISION MAKING: Stable/uncomplicated  EVALUATION COMPLEXITY: Low   GOALS: Goals reviewed with patient? Yes  SHORT TERM GOALS: (target date for Short term goals 07/06/2023)   1.  Patient will demonstrate independent use of home exercise program to maintain progress from in clinic treatments. Baseline: See objective data Goal status: Met 06/30/2023  2. PROM 0* ext to 110* flexion Baseline: See objective data Goal status: Ongoing   06/30/2023  3. Interim PSFS average >4/10 Baseline: See objective data Goal status: Ongoing   06/30/2023  LONG TERM GOALS: (target dates for all long term goals 08/02/2023   )   1. Patient will demonstrate/report pain at worst less than  or equal to 2/10 to facilitate minimal limitation in daily activity secondary to pain symptoms. Baseline: See objective data Goal status: Ongoing  06/30/2023   2. Patient will demonstrate independent use of home exercise program to facilitate ability to maintain/progress functional gains from skilled physical therapy services. Baseline: See objective data Goal status: Ongoing   06/30/2023  3.  Patient reports Patient-Specific Activity Score improved the average by 5 to indicate improvement in functional activities.  Baseline: SEE OBJECTIVE DATA Goal status: Ongoing   06/30/2023   4.  Patient will demonstrate LLE MMT 5/5 throughout to faciltiate usual transfers, stairs, squatting at [facility] for daily life.  Baseline: See objective data Goal status: Ongoing   06/30/2023   5.  Patient left knee AROM 0* ext to 110* flexion.  Baseline: See objective data Goal status: Ongoing   06/30/2023   6. Left knee PROM 0* to 120* Baseline: See objective data Goal status: Ongoing   06/30/2023   7.  patient ambulates >500', neg ramps, curbs and stairs 1 rail independently.  Baseline: See objective data Goal status: Ongoing  06/30/2023  PLAN:  PT FREQUENCY:  2x/week  PT DURATION: 8 weeks  PLANNED INTERVENTIONS: 97164- PT Re-evaluation, 97110-Therapeutic exercises, 97530- Therapeutic activity, 97112- Neuromuscular re-education, 97535- Self Care, 16109- Manual therapy, [phone]- Gait training, [phone]- Aquatic Therapy, [phone]- Electrical stimulation (unattended), [phone]- Electrical stimulation (manual), 97016- Vasopneumatic device, Patient/Family education, Balance training, Stair training, Taping, Dry Needling, Joint mobilization,  Scar mobilization, Vestibular training, DME instructions, Cryotherapy, Moist heat, and physical performance testing.   PLAN FOR NEXT SESSION: Continue functional strengthening, balance improvements.  Use of SPC in clinic.    [facility] 15 visit authorized.    Bonna Bustard, PT, DPT, OCS,  ATC 07/03/23  9:15 AM

## 2023-07-06 ENCOUNTER — Ambulatory Visit (INDEPENDENT_AMBULATORY_CARE_PROVIDER_SITE_OTHER): Admitting: Rehabilitative and Restorative Service Providers"

## 2023-07-06 ENCOUNTER — Encounter: Payer: Self-pay | Admitting: Rehabilitative and Restorative Service Providers"

## 2023-07-06 DIAGNOSIS — R2681 Unsteadiness on feet: Secondary | ICD-10-CM

## 2023-07-06 DIAGNOSIS — G8929 Other chronic pain: Secondary | ICD-10-CM

## 2023-07-06 DIAGNOSIS — M6281 Muscle weakness (generalized): Secondary | ICD-10-CM

## 2023-07-06 DIAGNOSIS — M25562 Pain in left knee: Secondary | ICD-10-CM | POA: Diagnosis not present

## 2023-07-06 DIAGNOSIS — M25662 Stiffness of left knee, not elsewhere classified: Secondary | ICD-10-CM | POA: Diagnosis not present

## 2023-07-06 DIAGNOSIS — R2689 Other abnormalities of gait and mobility: Secondary | ICD-10-CM

## 2023-07-06 DIAGNOSIS — R600 Localized edema: Secondary | ICD-10-CM

## 2023-07-06 NOTE — Therapy (Signed)
 OUTPATIENT PHYSICAL THERAPY TREATMENT NOTE   Patient Name: Jeffrey [name] MRN: [id number] DOB:[birth year], 68 y.o., male Today's Date: 07/06/2023  END OF SESSION:  PT End of Session - 07/06/23 0839     Visit Number 10    Number of Visits 15    Date for PT Re-Evaluation 08/03/23    Authorization Type [facility]    Authorization Time Period 15 visits  2/14-8/13/25    Authorization - Visit Number 10    Authorization - Number of Visits 15    Progress Note Due on Visit 15   to reflect VA number   PT Start Time 0831    PT Stop Time 0935    PT Time Calculation (min) 64 min    Activity Tolerance Patient tolerated treatment well    Behavior During Therapy [facility] for tasks assessed/performed                Past Medical History:  Diagnosis Date   Aneurysm of infrarenal abdominal aorta (HCC)    CAD (coronary artery disease)    4 stents   Carotid artery disease (HCC)    Left ICA   Diabetes mellitus without complication (HCC)    Dysrhythmia    A. Fib   Hyperlipidemia    Intracerebral hemorrhage (HCC) 1960   Peripheral neuropathy    Post-traumatic osteoarthritis of left knee    Past Surgical History:  Procedure Laterality Date   APPENDECTOMY  1960   I & D EXTREMITY Right 06/19/2020   Procedure: IRRIGATION AND DEBRIDEMENT LONG FINGER;  Surgeon: Brunilda Capra, MD;  Location: [facility] OR;  Service: Orthopedics;  Laterality: Right;   LEFT HEART CATH  06/20/2018   ORIF FEMUR FRACTURE  1988   TOTAL KNEE ARTHROPLASTY Left 05/12/2023   Procedure: LEFT TOTAL KNEE ARTHROPLASTY;  Surgeon: Wes Hamman, MD;  Location: [facility] OR;  Service: Orthopedics;  Laterality: Left;   VARICOSE VEIN SURGERY Right    right leg   Patient Active Problem List   Diagnosis Date Noted   Status post total left knee replacement 05/12/2023   Post-traumatic osteoarthritis of left knee 01/31/2023   Atrial fibrillation with rapid ventricular response (HCC) 11/26/2021   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   Occlusion and  stenosis of carotid artery without mention of cerebral infarction 11/19/2013    PCP: Beecher Bower, MD  REFERRING PROVIDER: Wes Hamman, MD  REFERRING DIAG: [phone] (ICD-10-CM) - H/O total knee replacement, left   THERAPY DIAG:  Chronic pain of left knee  Muscle weakness (generalized)  Stiffness of left knee, not elsewhere classified  Other abnormalities of gait and mobility  Unsteadiness on feet  Localized edema  Rationale for Evaluation and Treatment: Rehabilitation  ONSET DATE: 05/12/2023  Left TKA  SUBJECTIVE:   SUBJECTIVE STATEMENT: Pt indicated similar area of complaint at times, sometimes in WB and sometimes not.  Pt indicated otherwise doing ok.   PERTINENT HISTORY: Left TKA 05/12/23, OA Left femur fx (shaft just proximal to distal plate) ORIF 1324 & removal 01/05/22, A-Fib, CAD 4 stents, carotid artery disease, DM, HTN, HLD, Depression, aneurysm of infrarenal abdominal aorta, peripheral neuropathy  PAIN:  NPRS scale:no pain upon arrival.  Pain location: left knee more on medial / lateral joint line.  Pain description: dull ache, sore, Aggravating factors: WB pressure, consistent symptoms  Relieving factors:  rubbing, pain meds, ice, elevation  PRECAUTIONS: None  WEIGHT BEARING RESTRICTIONS: No  FALLS:  Has patient fallen in last 6 months? No  LIVING ENVIRONMENT: Lives  with: lives with their spouse and dog 55#   [place] farm.  Tractor climb.  Lives in: House Stairs: Yes:  External: 5 steps; 2 rails can reach both. Has following equipment at home: Single point cane, Walker - 2 wheeled, Wheelchair (manual), [employer], and Grab bars  OCCUPATION: retired.  Works on farm. Climb tractor.  Handyman work.  PLOF: Independent  PATIENT GOALS:   work on his farm.  Walk around. Travel with RV (bumper pull) needs to set up.    OBJECTIVE:  DIAGNOSTIC FINDINGS: 05/12/23 X-ray following TKA no complications.  Patient-Specific Activity Scoring Scheme  "0"  represents "unable to perform." "10" represents "able to perform at prior level. 0 1 2 3 4 5 6 7 8 9  10 (Date and Score)   Activity Eval  06/06/2023 06/30/2023   1. Climb on tractor  0 0   2. Walk on paved surfaces 2  5   3. Walk on uneven like farm  2 3  4. Climb stairs  2 3  5. Carry items & standing ADLs 2 3  Score 1.6 2.8 avg   Total score = sum of the activity scores/number of activities Minimum detectable change (90%CI) for average score = 2 points Minimum detectable change (90%CI) for single activity score = 3 points  COGNITION: 06/06/2023 Overall cognitive status: WFL    SENSATION: 06/06/2023 WFL  EDEMA:  Circumferential:   LLE: above knee 47.8cm,  around knee 45.5 cm, below knee 39.6 cm RLE: above knee 43.1 cm,  around knee 41.6 cm, below knee 35.4 cm  POSTURE: 06/06/2023   rounded shoulders, forward head, flexed trunk , and weight shift right  PALPATION: 06/06/2023 Tenderness around left knee joint line, quadriceps tendon and patella tendon.   Scar has decreased mobility especially over patella and tibia.   LOWER EXTREMITY ROM:   ROM Left Eval 06/06/2023 Left 06/12/23 Left 06/22/23 Left 06/30/2023  Hip flexion      Hip extension      Hip abduction      Hip adduction      Hip internal rotation      Hip external rotation      Knee flexion Seated A: 94* Supine A: 95* P: 99* Supine AROM heel slide  109 Seated A: 107* P: 111* 105 Active  Knee extension Seated  LAQ -30* Supine  Quad set -9* P: -7* -3 in supine quad set  -1 Active  Ankle dorsiflexion      Ankle plantarflexion      Ankle inversion      Ankle eversion       (Blank rows = not tested)  LOWER EXTREMITY MMT:  MMT Left Eval 06/06/2023  Hip flexion   Hip extension   Hip abduction   Hip adduction   Hip internal rotation   Hip external rotation   Knee flexion 3-/5  Knee extension 3-/5  Ankle dorsiflexion   Ankle plantarflexion   Ankle inversion   Ankle eversion    (Blank  rows = not tested)  FUNCTIONAL TESTS:  06/06/2023 18 inch chair transfer: heavy use of BUEs on armrests with LLE decreased use.  GAIT: 07/03/2023: Arrived c FWW into clinic.  Able to perform household distances in clinic with SPC in Rt UE with supervision.   06/06/2023 Distance walked: 100' Assistive device utilized: Environmental consultant - 2 wheeled Level of assistance: SBA / cues for deviations.  No balance issue with RW support.  Comments: excessive BUE weight bearing on RW,  left  knee flexed in stance, minimal knee flexion and terminal stance and swing.                    TODAY'S TREATMENT                                                                          DATE: 07/06/2023 Therapeutic Exercise: Recumbent bike seat 9 for 4 mins, seat 8 for 4 mins for ROM.  Lvl 2 8 mins  Incline gastroc stretch 30 sec x 5 bilateral  Seated Lt leg LAQ with end range pauses  x 15 4 lbs , no weight x 20 Supine quad set 5 sec hold, heel slide 5 sec hold Lt knee x 10 combo  Therapeutic Activities: (to improve progressive mobility, squatting, tranfers, ambulation, stairs) Leg press double leg x 20 in available flexion range 87 lbs , single leg 43 lbs 2 x 15   SPC use in clinic with cane in Rt hand - household distances within clinic between activity for continued practice on control/stability   Neuro Reed (to improve neuromuscular recruitment, balance) Tandem stance on foam 1 min x 2 bilateral with occasional HHA Tandem ambulation fwd/back on foam with occasional HHA on bar 6 ft x 5 each way Lateral stepping with lifting of leg for SL balance control 6 ft on foam x 5 each way with moderate HHA on bar SLS with contralateral leg tapping 3 cones in semicircle anteriorly x 6 each, performed bilaterally with moderate HHA on bar  Vasopneumatic Lt knee high pressure 34 deg in elevation.   TODAY'S TREATMENT                                                                          DATE:07/03/2023 Therapeutic  Exercise: Recumbent bike seat 9 for 4 mins, seat 8 for 4 mins for ROM.  Lvl 2 8 mins  Incline gastroc stretch 30 sec x 3 bilateral  Seated Lt leg LAQ with end range pauses 2 x 15 4 lbs   Therapeutic Activities: (to improve progressive mobility, squatting, tranfers, ambulation, stairs) Step on fwd, reverse step off with WB on Lt leg 4 inch step x 15 with Rt hand on bar.  Sit to stand to sit with chair and airex foam with light hand use upon standing and focus on slow lowering without hands x 10   SPC use in clinic with cane in Rt hand - household distances within clinic between activity  Neuro Reed (to improve neuromuscular recruitment, balance) Standing TKE blue band 5 sec hold x 15 Lt leg in // bars with hand assist on bars Tandem stance on foam 1 min x 2 bilateral   Vasopneumatic Lt knee high pressure 34 deg in elevation.    TODAY'S TREATMENT  DATE:06/30/2023 Recumbent bike Seat 11 for 8 minutes, Level 4 Seated straight leg raises 3 sets of 5 for 3 seconds, watch for quad lag  Functional Activities: Double Leg Press 87# full extension to full flexion 15 x slow eccentrics Single Leg Press 43# full extension to full flexion 10 x slow eccentrics  Neuromuscular re-education: Tandem balance: Eyes open 6 x 20 seconds each, encourage step strategy  Vaso Left Knee High 10 minutes 34*   TODAY'S TREATMENT                                                                          DATE:06/27/2023 Therapeutic Exercise: Precor recumbent bike seat 9:  Gastroc stretch slantboard 30 sec X 3 bilat Seated LLE LAQ 3# with RLE active knee flexion at end range of LLE LAQ to facilitate stronger contraction and controlled slow eccentric 10 reps 2 sets  Therapeutic Activities: Tandem stance on floor eyes open 30 sec ea LLE in front & in back occasional intermittent touch; eyes closed with increased intermittent touch //bars; on foam  eyes open with increased intermittent touch.   Step up & down using LLE 4" step with single rail 10 reps forward and 10 reps lateral Heel and toe raises 2X10 Leg press DL 16# 1W96, single leg left leg only, 43# 2X10  Manual Therapy: PROM with overpressure for knee flexion    Vaso Left knee with elevation & ext stretch 34* medium compression for 10 min.   PATIENT EDUCATION:  Education details: HEP, POC Person educated: Patient Education method: Programmer, multimedia, Demonstration, Verbal cues, and Handouts Education comprehension: verbalized understanding, returned demonstration, and verbal cues required  HOME EXERCISE PROGRAM: Access Code: [email or web address] URL: [email or web address] Date: 06/19/2023 Prepared by: Lorie Rook  Exercises - Ankle Alphabet in Elevation  - 2-4 x daily - 7 x weekly - 1 sets - 1 reps - Quad Setting and Stretching  - 2-4 x daily - 7 x weekly - 5-10 sets - 10 reps - prop 5-10 minutes & quad set5 seconds hold - Supine Heel Slide with Strap  - 2-3 x daily - 7 x weekly - 2-3 sets - 10 reps - 5 seconds hold - Supine Straight Leg Raises  - 2-3 x daily - 7 x weekly - 2-3 sets - 10 reps - 5 seconds hold - Seated Knee Flexion Extension AROM   - 2-4 x daily - 7 x weekly - 2-3 sets - 10 reps - 5 seconds hold - Seated straight leg lifts  - 2-3 x daily - 7 x weekly - 2-3 sets - 10 reps - 5 seconds hold - Tandem Stance  - 1 x daily - 7 x weekly - 1 sets - 5 reps - 20 second hold - Small Range Straight Leg Raise  - 2 x daily - 7 x weekly - 3-5 sets - 5 reps - 3 seconds hold - Hooklying Hamstring Stretch with Strap  - 1-2 x daily - 7 x weekly - 1 sets - 2-3 reps - 30 seconds hold - Gastroc Stretch on Step  - 1-2 x daily - 7 x weekly - 1 sets - 2-3 reps - 30 seconds hold - Supine Quadriceps Stretch with Strap on Table  - 1-2  x daily - 7 x weekly - 1 sets - 2-3 reps - 30 seconds hold  Patient Education - Scar Massage    ASSESSMENT: CLINICAL IMPRESSION: SPC brought in  by patient was slightly shorter than recommended but un adjustable.   It was close enough that it could be used safely.  Fair control on compliant surface balance noted today.   Complaints of proximal fibular/tibia WB pain was noted after prolonged standing activity in balance.  Palpation to area did not reveal any deformity changes or tenderness to touch. Area described was primary in area of bony landmarks. Adapted additional WB activity due to symptom presentation to help reduce complaints while in clinic today  Advised use of FWW when pain was present in ambulation to reduce loading.    OBJECTIVE IMPAIRMENTS: Abnormal gait, decreased activity tolerance, decreased balance, decreased endurance, decreased knowledge of condition, decreased knowledge of use of DME, decreased mobility, difficulty walking, decreased ROM, decreased strength, increased edema, postural dysfunction, and pain.   ACTIVITY LIMITATIONS: carrying, lifting, bending, standing, squatting, sleeping, stairs, transfers, locomotion level, and climbing on farm equipment  PARTICIPATION LIMITATIONS: meal prep, cleaning, laundry, driving, community activity, occupation, yard work, and travel  PERSONAL FACTORS: Fitness, Past/current experiences, Time since onset of injury/illness/exacerbation, and 3+ comorbidities: see PMH  are also affecting patient's functional outcome.   REHAB POTENTIAL: Good  CLINICAL DECISION MAKING: Stable/uncomplicated  EVALUATION COMPLEXITY: Low   GOALS: Goals reviewed with patient? Yes  SHORT TERM GOALS: (target date for Short term goals 07/06/2023)   1.  Patient will demonstrate independent use of home exercise program to maintain progress from in clinic treatments. Baseline: See objective data Goal status: Met 06/30/2023  2. PROM 0* ext to 110* flexion Baseline: See objective data Goal status: Ongoing   06/30/2023  3. Interim PSFS average >4/10 Baseline: See objective data Goal status: Ongoing    06/30/2023  LONG TERM GOALS: (target dates for all long term goals 08/02/2023   )   1. Patient will demonstrate/report pain at worst less than or equal to 2/10 to facilitate minimal limitation in daily activity secondary to pain symptoms. Baseline: See objective data Goal status: Ongoing  06/30/2023   2. Patient will demonstrate independent use of home exercise program to facilitate ability to maintain/progress functional gains from skilled physical therapy services. Baseline: See objective data Goal status: Ongoing   06/30/2023  3.  Patient reports Patient-Specific Activity Score improved the average by 5 to indicate improvement in functional activities.  Baseline: SEE OBJECTIVE DATA Goal status: Ongoing   06/30/2023   4.  Patient will demonstrate LLE MMT 5/5 throughout to faciltiate usual transfers, stairs, squatting at [facility] for daily life.  Baseline: See objective data Goal status: Ongoing   06/30/2023   5.  Patient left knee AROM 0* ext to 110* flexion.  Baseline: See objective data Goal status: Ongoing   06/30/2023   6. Left knee PROM 0* to 120* Baseline: See objective data Goal status: Ongoing   06/30/2023   7.  patient ambulates >500', neg ramps, curbs and stairs 1 rail independently.  Baseline: See objective data Goal status: Ongoing  06/30/2023  PLAN:  PT FREQUENCY:  2x/week  PT DURATION: 8 weeks  PLANNED INTERVENTIONS: 97164- PT Re-evaluation, 97110-Therapeutic exercises, 97530- Therapeutic activity, 97112- Neuromuscular re-education, 97535- Self Care, 28413- Manual therapy, [phone]- Gait training, [phone]- Aquatic Therapy, [phone]- Electrical stimulation (unattended), [phone]- Electrical stimulation (manual), 97016- Vasopneumatic device, Patient/Family education, Balance training, Stair training, Taping, Dry Needling, Joint mobilization, Scar mobilization,  Vestibular training, DME instructions, Cryotherapy, Moist heat, and physical performance testing.   PLAN FOR NEXT SESSION:  Monitor and adapt with pain reported from lateral /inferior knee.    [facility] 15 visit authorized.    Bonna Bustard, PT, DPT, OCS, ATC 07/06/23  9:22 AM

## 2023-07-10 ENCOUNTER — Encounter: Payer: Self-pay | Admitting: Physical Therapy

## 2023-07-10 ENCOUNTER — Ambulatory Visit (INDEPENDENT_AMBULATORY_CARE_PROVIDER_SITE_OTHER): Admitting: Physical Therapy

## 2023-07-10 DIAGNOSIS — R2681 Unsteadiness on feet: Secondary | ICD-10-CM

## 2023-07-10 DIAGNOSIS — M6281 Muscle weakness (generalized): Secondary | ICD-10-CM | POA: Diagnosis not present

## 2023-07-10 DIAGNOSIS — R600 Localized edema: Secondary | ICD-10-CM

## 2023-07-10 DIAGNOSIS — G8929 Other chronic pain: Secondary | ICD-10-CM

## 2023-07-10 DIAGNOSIS — R2689 Other abnormalities of gait and mobility: Secondary | ICD-10-CM

## 2023-07-10 DIAGNOSIS — M25662 Stiffness of left knee, not elsewhere classified: Secondary | ICD-10-CM | POA: Diagnosis not present

## 2023-07-10 DIAGNOSIS — M25562 Pain in left knee: Secondary | ICD-10-CM

## 2023-07-10 NOTE — Therapy (Signed)
 OUTPATIENT PHYSICAL THERAPY TREATMENT NOTE   Patient Name: Jeffrey [name]: [id number] DOB:06-Mar-1956, 68 y.o., male Today's Date: 07/10/2023  END OF SESSION:  PT End of Session - 07/10/23 0853     Visit Number 11    Number of Visits 15    Date for PT Re-Evaluation 08/03/23    Authorization Type [facility]    Authorization Time Period 15 visits  2/14-8/13/25    Authorization - Visit Number 11    Authorization - Number of Visits 15    Progress Note Due on Visit 15   to reflect VA number   PT Start Time 0845    PT Stop Time 0940    PT Time Calculation (min) 55 min    Activity Tolerance Patient tolerated treatment well;Patient limited by pain    Behavior During Therapy [facility] for tasks assessed/performed                 Past Medical History:  Diagnosis Date   Aneurysm of infrarenal abdominal aorta (HCC)    CAD (coronary artery disease)    4 stents   Carotid artery disease (HCC)    Left ICA   Diabetes mellitus without complication (HCC)    Dysrhythmia    A. Fib   Hyperlipidemia    Intracerebral hemorrhage (HCC) 1960   Peripheral neuropathy    Post-traumatic osteoarthritis of left knee    Past Surgical History:  Procedure Laterality Date   APPENDECTOMY  1960   I & D EXTREMITY Right 06/19/2020   Procedure: IRRIGATION AND DEBRIDEMENT LONG FINGER;  Surgeon: Brunilda Capra, MD;  Location: [facility] OR;  Service: Orthopedics;  Laterality: Right;   LEFT HEART CATH  06/20/2018   ORIF FEMUR FRACTURE  1988   TOTAL KNEE ARTHROPLASTY Left 05/12/2023   Procedure: LEFT TOTAL KNEE ARTHROPLASTY;  Surgeon: Wes Hamman, MD;  Location: [facility] OR;  Service: Orthopedics;  Laterality: Left;   VARICOSE VEIN SURGERY Right    right leg   Patient Active Problem List   Diagnosis Date Noted   Status post total left knee replacement 05/12/2023   Post-traumatic osteoarthritis of left knee 01/31/2023   Atrial fibrillation with rapid ventricular response (HCC) 11/26/2021   Infectious arthropathy (HCC)  06/19/2020   Occlusion and stenosis of carotid artery without mention of cerebral infarction 11/19/2013    PCP: Beecher Bower, MD  REFERRING PROVIDER: Wes Hamman, MD  REFERRING DIAG: [phone] (ICD-10-CM) - H/O total knee replacement, left   THERAPY DIAG:  Chronic pain of left knee  Muscle weakness (generalized)  Stiffness of left knee, not elsewhere classified  Other abnormalities of gait and mobility  Unsteadiness on feet  Localized edema  Rationale for Evaluation and Treatment: Rehabilitation  ONSET DATE: 05/12/2023  Left TKA  SUBJECTIVE:   SUBJECTIVE STATEMENT: He changed to more supportive shoes.  The knee seems to buckle with uneven terrain with cane.     PERTINENT HISTORY: Left TKA 05/12/23, OA Left femur fx (shaft just proximal to distal plate) ORIF 9147 & removal 01/05/22, A-Fib, CAD 4 stents, carotid artery disease, DM, HTN, HLD, Depression, aneurysm of infrarenal abdominal aorta, peripheral neuropathy  PAIN:  NPRS scale:  no pain upon arrival. Since last PT highest 5-6/10 after exercises (swells) Pain location: left knee more on medial / lateral joint line.  Pain description: dull ache, sore, Aggravating factors: WB pressure, consistent symptoms  Relieving factors:  rubbing, pain meds, ice, elevation  PRECAUTIONS: None  WEIGHT BEARING RESTRICTIONS: No  FALLS:  Has patient fallen in last 6 months? No  LIVING ENVIRONMENT: Lives with: lives with their spouse and dog 55#   [place]ives [place] farm.  Tractor climb.  Lives in: House Stairs: Yes:  External: 5 steps; 2 rails can reach both. Has following equipment at home: Single point cane, Walker - 2 wheeled, Wheelchair (manual), [employer], and Grab bars  OCCUPATION: retired.  Works on farm. Climb tractor.  Handyman work.  PLOF: Independent  PATIENT GOALS:   work on his farm.  Walk around. Travel with RV (bumper pull) needs to set up.    OBJECTIVE:  DIAGNOSTIC FINDINGS: 05/12/23 X-ray following TKA no  complications.  Patient-Specific Activity Scoring Scheme  "0" represents "unable to perform." "10" represents "able to perform at prior level. 0 1 2 3 4 5 6 7 8 9  10 (Date and Score)   Activity Eval  06/06/2023 06/30/23  07/10/23  1. Climb on tractor  0 0  1  2. Walk on paved surfaces 2  5  7   3. Walk on uneven like farm  2 3 4   4. Climb stairs  2 3 3   5. Carry items & standing ADLs 2 3 3   Score 1.6 2.8 avg 3.6   Total score = sum of the activity scores/number of activities Minimum detectable change (90%CI) for average score = 2 points Minimum detectable change (90%CI) for single activity score = 3 points  COGNITION: 06/06/2023 Overall cognitive status: WFL    SENSATION: 06/06/2023 WFL  EDEMA:  Circumferential:   LLE: above knee 47.8cm,  around knee 45.5 cm, below knee 39.6 cm RLE: above knee 43.1 cm,  around knee 41.6 cm, below knee 35.4 cm  POSTURE: 06/06/2023   rounded shoulders, forward head, flexed trunk , and weight shift right  PALPATION: 06/06/2023 Tenderness around left knee joint line, quadriceps tendon and patella tendon.   Scar has decreased mobility especially over patella and tibia.   LOWER EXTREMITY ROM:   ROM Left Eval 06/06/2023 Left 06/12/23 Left 06/22/23 Left 06/30/2023  Hip flexion      Hip extension      Hip abduction      Hip adduction      Hip internal rotation      Hip external rotation      Knee flexion Seated A: 94* Supine A: 95* P: 99* Supine AROM heel slide  109 Seated A: 107* P: 111* 105 Active  Knee extension Seated  LAQ -30* Supine  Quad set -9* P: -7* -3 in supine quad set  -1 Active  Ankle dorsiflexion      Ankle plantarflexion      Ankle inversion      Ankle eversion       (Blank rows = not tested)  LOWER EXTREMITY MMT:  MMT Left Eval 06/06/2023  Hip flexion   Hip extension   Hip abduction   Hip adduction   Hip internal rotation   Hip external rotation   Knee flexion 3-/5  Knee extension 3-/5  Ankle  dorsiflexion   Ankle plantarflexion   Ankle inversion   Ankle eversion    (Blank rows = not tested)  FUNCTIONAL TESTS:  06/06/2023 18 inch chair transfer: heavy use of BUEs on armrests with LLE decreased use.  GAIT: 07/03/2023: Arrived c FWW into clinic.  Able to perform household distances in clinic with SPC in Rt UE with supervision.   06/06/2023 Distance walked: 100' Assistive device utilized: Environmental consultant - 2 wheeled Level of assistance: SBA / cues  for deviations.  No balance issue with RW support.  Comments: excessive BUE weight bearing on RW,  left knee flexed in stance, minimal knee flexion and terminal stance and swing.                    TODAY'S TREATMENT                                                                          DATE: 07/10/2023 Therapeutic Exercise: Seated LAQ no weight 10 reps, 3# 10 reps, 5# 10 reps Seated hamstring curl green theraband 10 reps 2 sets.  PT demo & verbal cues as HEP. Pt verbalized understanding.  Therapeutic Activities: (to improve progressive mobility, squatting, tranfers, ambulation, stairs) Leg press double leg x 20 in available flexion range 87 lbs , single leg 43 lbs 15 reps  Neuromuscular Reeducation (to improve neuromuscular recruitment, balance) Tandem stance LLE in front & in back 30 sec: on floor eyes open with only 1-2 touches //bars; on floor eyes closed with frequent touch //bars; on foam beam eyes open with frequent touch //bars. Discussed at HEP near sink. Pt verbalized understanding.  Standing with ipsilateral UE support sink slider in Y directions with stance knee flexion on outward motion & extension inward BLEs 5 reps ea.   Vasopneumatic Lt knee high pressure 34 deg in elevation.     TREATMENT                                                                          DATE: 07/06/2023 Therapeutic Exercise: Recumbent bike seat 9 for 4 mins, seat 8 for 4 mins for ROM.  Lvl 2 8 mins  Incline gastroc stretch 30 sec x 5 bilateral   Seated Lt leg LAQ with end range pauses  x 15 4 lbs , no weight x 20 Supine quad set 5 sec hold, heel slide 5 sec hold Lt knee x 10 combo  Therapeutic Activities: (to improve progressive mobility, squatting, tranfers, ambulation, stairs) Leg press double leg x 20 in available flexion range 87 lbs , single leg 43 lbs 2 x 15   SPC use in clinic with cane in Rt hand - household distances within clinic between activity for continued practice on control/stability   Neuro Reed (to improve neuromuscular recruitment, balance) Tandem stance on foam 1 min x 2 bilateral with occasional HHA Tandem ambulation fwd/back on foam with occasional HHA on bar 6 ft x 5 each way Lateral stepping with lifting of leg for SL balance control 6 ft on foam x 5 each way with moderate HHA on bar SLS with contralateral leg tapping 3 cones in semicircle anteriorly x 6 each, performed bilaterally with moderate HHA on bar  Vasopneumatic Lt knee high pressure 34 deg in elevation.   TREATMENT  DATE:07/03/2023 Therapeutic Exercise: Recumbent bike seat 9 for 4 mins, seat 8 for 4 mins for ROM.  Lvl 2 8 mins  Incline gastroc stretch 30 sec x 3 bilateral  Seated Lt leg LAQ with end range pauses 2 x 15 4 lbs   Therapeutic Activities: (to improve progressive mobility, squatting, tranfers, ambulation, stairs) Step on fwd, reverse step off with WB on Lt leg 4 inch step x 15 with Rt hand on bar.  Sit to stand to sit with chair and airex foam with light hand use upon standing and focus on slow lowering without hands x 10   SPC use in clinic with cane in Rt hand - household distances within clinic between activity  Neuro Reed (to improve neuromuscular recruitment, balance) Standing TKE blue band 5 sec hold x 15 Lt leg in // bars with hand assist on bars Tandem stance on foam 1 min x 2 bilateral   Vasopneumatic Lt knee high pressure 34 deg in elevation.     PATIENT EDUCATION:  Education details: HEP, POC Person educated: Patient Education method: Programmer, multimedia, Demonstration, Verbal cues, and Handouts Education comprehension: verbalized understanding, returned demonstration, and verbal cues required  HOME EXERCISE PROGRAM: Access Code: [email or web address] URL: [email or web address] Date: 06/19/2023 Prepared by: Lorie Rook  Exercises - Ankle Alphabet in Elevation  - 2-4 x daily - 7 x weekly - 1 sets - 1 reps - Quad Setting and Stretching  - 2-4 x daily - 7 x weekly - 5-10 sets - 10 reps - prop 5-10 minutes & quad set5 seconds hold - Supine Heel Slide with Strap  - 2-3 x daily - 7 x weekly - 2-3 sets - 10 reps - 5 seconds hold - Supine Straight Leg Raises  - 2-3 x daily - 7 x weekly - 2-3 sets - 10 reps - 5 seconds hold - Seated Knee Flexion Extension AROM   - 2-4 x daily - 7 x weekly - 2-3 sets - 10 reps - 5 seconds hold - Seated straight leg lifts  - 2-3 x daily - 7 x weekly - 2-3 sets - 10 reps - 5 seconds hold - Tandem Stance  - 1 x daily - 7 x weekly - 1 sets - 5 reps - 20 second hold - Small Range Straight Leg Raise  - 2 x daily - 7 x weekly - 3-5 sets - 5 reps - 3 seconds hold - Hooklying Hamstring Stretch with Strap  - 1-2 x daily - 7 x weekly - 1 sets - 2-3 reps - 30 seconds hold - Gastroc Stretch on Step  - 1-2 x daily - 7 x weekly - 1 sets - 2-3 reps - 30 seconds hold - Supine Quadriceps Stretch with Strap on Table  - 1-2 x daily - 7 x weekly - 1 sets - 2-3 reps - 30 seconds hold  Patient Education - Scar Massage    ASSESSMENT: CLINICAL IMPRESSION: Patient is slowly improving functional range and strength but pain limits speed of recovery.  Patient has balance deficits limiting standing activities also.   OBJECTIVE IMPAIRMENTS: Abnormal gait, decreased activity tolerance, decreased balance, decreased endurance, decreased knowledge of condition, decreased knowledge of use of DME, decreased mobility, difficulty  walking, decreased ROM, decreased strength, increased edema, postural dysfunction, and pain.   ACTIVITY LIMITATIONS: carrying, lifting, bending, standing, squatting, sleeping, stairs, transfers, locomotion level, and climbing on farm equipment  PARTICIPATION LIMITATIONS: meal prep, cleaning, laundry, driving, community activity, occupation, yard work, and travel  PERSONAL FACTORS: Fitness,  Past/current experiences, Time since onset of injury/illness/exacerbation, and 3+ comorbidities: see PMH  are also affecting patient's functional outcome.   REHAB POTENTIAL: Good  CLINICAL DECISION MAKING: Stable/uncomplicated  EVALUATION COMPLEXITY: Low   GOALS: Goals reviewed with patient? Yes  SHORT TERM GOALS: (target date for Short term goals 07/06/2023)   1.  Patient will demonstrate independent use of home exercise program to maintain progress from in clinic treatments. Baseline: See objective data Goal status: Met 06/30/2023  2. PROM 0* ext to 110* flexion Baseline: See objective data Goal status: Ongoing   07/10/2023  3. Interim PSFS average >4/10 Baseline: See objective data Goal status: Ongoing   07/10/2023  LONG TERM GOALS: (target dates for all long term goals 08/02/2023   )   1. Patient will demonstrate/report pain at worst less than or equal to 2/10 to facilitate minimal limitation in daily activity secondary to pain symptoms. Baseline: See objective data Goal status: Ongoing  07/10/2023   2. Patient will demonstrate independent use of home exercise program to facilitate ability to maintain/progress functional gains from skilled physical therapy services. Baseline: See objective data Goal status: Ongoing 07/10/2023  3.  Patient reports Patient-Specific Activity Score improved the average by 5 to indicate improvement in functional activities.  Baseline: SEE OBJECTIVE DATA Goal status: Ongoing  07/10/2023   4.  Patient will demonstrate LLE MMT 5/5 throughout to faciltiate usual  transfers, stairs, squatting at [facility] for daily life.  Baseline: See objective data Goal status: Ongoing   07/10/2023   5.  Patient left knee AROM 0* ext to 110* flexion.  Baseline: See objective data Goal status: Ongoing  07/10/2023   6. Left knee PROM 0* to 120* Baseline: See objective data Goal status: Ongoing   07/10/2023   7.  patient ambulates >500', neg ramps, curbs and stairs 1 rail independently.  Baseline: See objective data Goal status: Ongoing  07/10/2023  PLAN:  PT FREQUENCY:  2x/week  PT DURATION: 8 weeks  PLANNED INTERVENTIONS: 97164- PT Re-evaluation, 97110-Therapeutic exercises, 97530- Therapeutic activity, 97112- Neuromuscular re-education, 97535- Self Care, 86578- Manual therapy, [phone]- Gait training, [phone]- Aquatic Therapy, [phone]- Electrical stimulation (unattended), [phone]- Electrical stimulation (manual), 97016- Vasopneumatic device, Patient/Family education, Balance training, Stair training, Taping, Dry Needling, Joint mobilization, Scar mobilization, Vestibular training, DME instructions, Cryotherapy, Moist heat, and physical performance testing.   PLAN FOR NEXT SESSION: check ROM, functional strengthening and balance activities,   Monitor and adapt with pain reported from lateral /inferior knee.    [facility] 15 visit authorized.    Rylann Munford, PT, DPT 07/10/2023, 10:47 AM

## 2023-07-13 ENCOUNTER — Other Ambulatory Visit: Payer: Self-pay | Admitting: Physician Assistant

## 2023-07-13 ENCOUNTER — Ambulatory Visit: Admitting: Physical Therapy

## 2023-07-13 ENCOUNTER — Telehealth: Payer: Self-pay | Admitting: Physician Assistant

## 2023-07-13 ENCOUNTER — Encounter: Payer: Self-pay | Admitting: Physical Therapy

## 2023-07-13 DIAGNOSIS — M25662 Stiffness of left knee, not elsewhere classified: Secondary | ICD-10-CM | POA: Diagnosis not present

## 2023-07-13 DIAGNOSIS — R600 Localized edema: Secondary | ICD-10-CM

## 2023-07-13 DIAGNOSIS — M6281 Muscle weakness (generalized): Secondary | ICD-10-CM | POA: Diagnosis not present

## 2023-07-13 DIAGNOSIS — G8929 Other chronic pain: Secondary | ICD-10-CM

## 2023-07-13 DIAGNOSIS — R2681 Unsteadiness on feet: Secondary | ICD-10-CM

## 2023-07-13 DIAGNOSIS — M25562 Pain in left knee: Secondary | ICD-10-CM | POA: Diagnosis not present

## 2023-07-13 DIAGNOSIS — R2689 Other abnormalities of gait and mobility: Secondary | ICD-10-CM

## 2023-07-13 MED ORDER — HYDROCODONE-ACETAMINOPHEN 5-325 MG PO TABS: 1.0000 | ORAL_TABLET | Freq: Two times a day (BID) | ORAL | 0 refills | Status: DC | PRN

## 2023-07-13 NOTE — Telephone Encounter (Signed)
 Called and left message on machine with message about medication.

## 2023-07-13 NOTE — Telephone Encounter (Signed)
 Patient is here. He would like a refill on hydrocodone  called in for his pain.

## 2023-07-13 NOTE — Therapy (Signed)
 OUTPATIENT PHYSICAL THERAPY TREATMENT NOTE   Patient Name: Jeffrey [name] MRN: [id number] DOB:1955/08/11, 68 y.o., male Today's Date: 07/13/2023  END OF SESSION:  PT End of Session - 07/13/23 0842     Visit Number 12    Number of Visits 15    Date for PT Re-Evaluation 08/03/23    Authorization Type [facility]    Authorization Time Period 15 visits  2/14-8/13/25    Authorization - Visit Number 12    Authorization - Number of Visits 15    Progress Note Due on Visit 15   to reflect VA number   PT Start Time 0842    PT Stop Time 0936    PT Time Calculation (min) 54 min    Activity Tolerance Patient tolerated treatment well;Patient limited by pain    Behavior During Therapy [facility] for tasks assessed/performed                  Past Medical History:  Diagnosis Date   Aneurysm of infrarenal abdominal aorta (HCC)    CAD (coronary artery disease)    4 stents   Carotid artery disease (HCC)    Left ICA   Diabetes mellitus without complication (HCC)    Dysrhythmia    A. Fib   Hyperlipidemia    Intracerebral hemorrhage (HCC) 1960   Peripheral neuropathy    Post-traumatic osteoarthritis of left knee    Past Surgical History:  Procedure Laterality Date   APPENDECTOMY  1960   I & D EXTREMITY Right 06/19/2020   Procedure: IRRIGATION AND DEBRIDEMENT LONG FINGER;  Surgeon: Brunilda Capra, MD;  Location: [facility] OR;  Service: Orthopedics;  Laterality: Right;   LEFT HEART CATH  06/20/2018   ORIF FEMUR FRACTURE  1988   TOTAL KNEE ARTHROPLASTY Left 05/12/2023   Procedure: LEFT TOTAL KNEE ARTHROPLASTY;  Surgeon: Wes Hamman, MD;  Location: [facility] OR;  Service: Orthopedics;  Laterality: Left;   VARICOSE VEIN SURGERY Right    right leg   Patient Active Problem List   Diagnosis Date Noted   Status post total left knee replacement 05/12/2023   Post-traumatic osteoarthritis of left knee 01/31/2023   Atrial fibrillation with rapid ventricular response (HCC) 11/26/2021   Infectious arthropathy (HCC)  06/19/2020   Occlusion and stenosis of carotid artery without mention of cerebral infarction 11/19/2013    PCP: Beecher Bower, MD  REFERRING PROVIDER: Wes Hamman, MD  REFERRING DIAG: [phone] (ICD-10-CM) - H/O total knee replacement, left   THERAPY DIAG:  Chronic pain of left knee  Muscle weakness (generalized)  Stiffness of left knee, not elsewhere classified  Other abnormalities of gait and mobility  Unsteadiness on feet  Localized edema  Rationale for Evaluation and Treatment: Rehabilitation  ONSET DATE: 05/12/2023  Left TKA  SUBJECTIVE:   SUBJECTIVE STATEMENT: His knee is hurting more so he has been using RW.  He is unaware of exercise or activity that is spiking pain.    PERTINENT HISTORY: Left TKA 05/12/23, OA Left femur fx (shaft just proximal to distal plate) ORIF 9147 & removal 01/05/22, A-Fib, CAD 4 stents, carotid artery disease, DM, HTN, HLD, Depression, aneurysm of infrarenal abdominal aorta, peripheral neuropathy  PAIN:  NPRS scale: 6-6 upon arising, then at rest 1-2/10. Since last PT highest  6-7/10 after exercises (swells) Pain location: left knee more on medial / lateral joint line.  Pain description: dull ache, sore, Aggravating factors: WB pressure, consistent symptoms  Relieving factors:  rubbing, pain meds, ice, elevation  PRECAUTIONS:  None  WEIGHT BEARING RESTRICTIONS: No  FALLS:  Has patient fallen in last 6 months? No  LIVING ENVIRONMENT: Lives with: lives with their spouse and dog 55#  lives [place] farm.  Tractor climb.  Lives in: House Stairs: Yes:  External: 5 steps; 2 rails can reach both. Has following equipment at home: Single point cane, Walker - 2 wheeled, Wheelchair (manual), [employer], and Grab bars  OCCUPATION: retired.  Works on farm. Climb tractor.  Handyman work.  PLOF: Independent  PATIENT GOALS:   work on his farm.  Walk around. Travel with RV (bumper pull) needs to set up.    OBJECTIVE:  DIAGNOSTIC FINDINGS: 05/12/23  X-ray following TKA no complications.  Patient-Specific Activity Scoring Scheme  "0" represents "unable to perform." "10" represents "able to perform at prior level. 0 1 2 3 4 5 6 7 8 9  10 (Date and Score)   Activity Eval  06/06/2023 06/30/23  07/10/23  1. Climb on tractor  0 0  1  2. Walk on paved surfaces 2  5  7   3. Walk on uneven like farm  2 3 4   4. Climb stairs  2 3 3   5. Carry items & standing ADLs 2 3 3   Score 1.6 2.8 avg 3.6   Total score = sum of the activity scores/number of activities Minimum detectable change (90%CI) for average score = 2 points Minimum detectable change (90%CI) for single activity score = 3 points  COGNITION: 06/06/2023 Overall cognitive status: WFL    SENSATION: 06/06/2023 WFL  EDEMA:  Circumferential:   LLE: above knee 47.8cm,  around knee 45.5 cm, below knee 39.6 cm RLE: above knee 43.1 cm,  around knee 41.6 cm, below knee 35.4 cm  POSTURE: 06/06/2023   rounded shoulders, forward head, flexed trunk , and weight shift right  PALPATION: 06/06/2023 Tenderness around left knee joint line, quadriceps tendon and patella tendon.   Scar has decreased mobility especially over patella and tibia.   LOWER EXTREMITY ROM:   ROM Left Eval 06/06/2023 Left 06/12/23 Left 06/22/23 Left  06/30/23 Left 07/13/23  Hip flexion       Hip extension       Hip abduction       Hip adduction       Hip internal rotation       Hip external rotation       Knee flexion Seated A: 94* Supine A: 95* P: 99* Supine AROM heel slide  109 Seated A: 107* P: 111* 105 Active P: 109*  Knee extension Seated  LAQ -30* Supine  Quad set -9* P: -7* -3 in supine quad set  -1 Active P: 0*  Ankle dorsiflexion       Ankle plantarflexion       Ankle inversion       Ankle eversion        (Blank rows = not tested)  LOWER EXTREMITY MMT:  MMT Left Eval 06/06/2023  Hip flexion   Hip extension   Hip abduction   Hip adduction   Hip internal rotation   Hip external  rotation   Knee flexion 3-/5  Knee extension 3-/5  Ankle dorsiflexion   Ankle plantarflexion   Ankle inversion   Ankle eversion    (Blank rows = not tested)  FUNCTIONAL TESTS:  06/06/2023 18 inch chair transfer: heavy use of BUEs on armrests with LLE decreased use.  GAIT: 07/03/2023: Arrived c FWW into clinic.  Able to perform household distances in clinic with  SPC in Rt UE with supervision.   06/06/2023 Distance walked: 100' Assistive device utilized: Environmental consultant - 2 wheeled Level of assistance: SBA / cues for deviations.  No balance issue with RW support.  Comments: excessive BUE weight bearing on RW,  left knee flexed in stance, minimal knee flexion and terminal stance and swing.                    TODAY'S TREATMENT                                                                          DATE: 07/13/2023 Therapeutic Exercise: Recumbent bike seat 9 for 4 mins, seat 8 for 4 mins for ROM.  Lvl 3 8 mins   Therapeutic Activities: (to improve progressive mobility, squatting, tranfers, ambulation, stairs) Leg press double leg x 10 reps 2 sets in available flexion range 87 lbs; BLE single leg 43 lbs 15 reps Standing with ipsilateral UE support sink slider in 3 directions (ant-lat, lat & post-lat) with stance knee flexion on outward motion & extension inward BLEs 5 reps ea. Patient reports limited in standing tolerance.  PT demo & verbal cues on using bar stool (with his ht 29") including sit to/from stand. This would have partial weight on legs and vision/hands/trunk closer to upright than sitting in 18" chair. Pt verbalized understanding.  PT demo & verbal cues on climbing A-frame ladder with modified technique (up with "good" & down with "bad").  This activity is helping with pt goal to get on/off his tractor.  Pt able to climb up & down 2 rungs with PT CGA for safety.    Self-care: Pt questioning use of heat.  PT educated on time to prevent burn and monitor any increases in edema.  Pt  verbalized understanding.   Vasopneumatic Lt knee high pressure 34 deg in elevation with extension prop 10 min.     TREATMENT                                                                          DATE: 07/10/2023 Therapeutic Exercise: Seated LAQ no weight 10 reps, 3# 10 reps, 5# 10 reps Seated hamstring curl green theraband 10 reps 2 sets.  PT demo & verbal cues as HEP. Pt verbalized understanding.  Therapeutic Activities: (to improve progressive mobility, squatting, tranfers, ambulation, stairs) Leg press double leg x 20 in available flexion range 87 lbs , single leg 43 lbs 10 reps 2 sets  Neuromuscular Reeducation (to improve neuromuscular recruitment, balance) Tandem stance LLE in front & in back 30 sec: on floor eyes open with only 1-2 touches //bars; on floor eyes closed with frequent touch //bars; on foam beam eyes open with frequent touch //bars. Discussed at HEP near sink. Pt verbalized understanding.  Standing with ipsilateral UE support sink slider in Y directions with stance knee flexion on outward motion & extension inward BLEs 5 reps ea.   Vasopneumatic Lt knee high  pressure 34 deg in elevation.     TREATMENT                                                                          DATE: 07/06/2023 Therapeutic Exercise: Recumbent bike seat 9 for 4 mins, seat 8 for 4 mins for ROM.  Lvl 2 8 mins  Incline gastroc stretch 30 sec x 5 bilateral  Seated Lt leg LAQ with end range pauses  x 15 4 lbs , no weight x 20 Supine quad set 5 sec hold, heel slide 5 sec hold Lt knee x 10 combo  Therapeutic Activities: (to improve progressive mobility, squatting, tranfers, ambulation, stairs) Leg press double leg x 20 in available flexion range 87 lbs , single leg 43 lbs 2 x 15   SPC use in clinic with cane in Rt hand - household distances within clinic between activity for continued practice on control/stability   Neuro Reed (to improve neuromuscular recruitment, balance) Tandem stance  on foam 1 min x 2 bilateral with occasional HHA Tandem ambulation fwd/back on foam with occasional HHA on bar 6 ft x 5 each way Lateral stepping with lifting of leg for SL balance control 6 ft on foam x 5 each way with moderate HHA on bar SLS with contralateral leg tapping 3 cones in semicircle anteriorly x 6 each, performed bilaterally with moderate HHA on bar  Vasopneumatic Lt knee high pressure 34 deg in elevation.     PATIENT EDUCATION:  Education details: HEP, POC Person educated: Patient Education method: Programmer, multimedia, Demonstration, Verbal cues, and Handouts Education comprehension: verbalized understanding, returned demonstration, and verbal cues required  HOME EXERCISE PROGRAM: Access Code: [email or web address] URL: [email or web address] Date: 06/19/2023 Prepared by: Lorie Rook  Exercises - Ankle Alphabet in Elevation  - 2-4 x daily - 7 x weekly - 1 sets - 1 reps - Quad Setting and Stretching  - 2-4 x daily - 7 x weekly - 5-10 sets - 10 reps - prop 5-10 minutes & quad set5 seconds hold - Supine Heel Slide with Strap  - 2-3 x daily - 7 x weekly - 2-3 sets - 10 reps - 5 seconds hold - Supine Straight Leg Raises  - 2-3 x daily - 7 x weekly - 2-3 sets - 10 reps - 5 seconds hold - Seated Knee Flexion Extension AROM   - 2-4 x daily - 7 x weekly - 2-3 sets - 10 reps - 5 seconds hold - Seated straight leg lifts  - 2-3 x daily - 7 x weekly - 2-3 sets - 10 reps - 5 seconds hold - Tandem Stance  - 1 x daily - 7 x weekly - 1 sets - 5 reps - 20 second hold - Small Range Straight Leg Raise  - 2 x daily - 7 x weekly - 3-5 sets - 5 reps - 3 seconds hold - Hooklying Hamstring Stretch with Strap  - 1-2 x daily - 7 x weekly - 1 sets - 2-3 reps - 30 seconds hold - Gastroc Stretch on Step  - 1-2 x daily - 7 x weekly - 1 sets - 2-3 reps - 30 seconds hold - Supine Quadriceps Stretch with Strap on Table  - 1-2 x  daily - 7 x weekly - 1 sets - 2-3 reps - 30 seconds hold  Patient Education -  Scar Massage    ASSESSMENT: CLINICAL IMPRESSION: Patient's pain continues to limit his activities.  He is improving his functional mobility.    Patient has balance deficits limiting standing activities also.   OBJECTIVE IMPAIRMENTS: Abnormal gait, decreased activity tolerance, decreased balance, decreased endurance, decreased knowledge of condition, decreased knowledge of use of DME, decreased mobility, difficulty walking, decreased ROM, decreased strength, increased edema, postural dysfunction, and pain.   ACTIVITY LIMITATIONS: carrying, lifting, bending, standing, squatting, sleeping, stairs, transfers, locomotion level, and climbing on farm equipment  PARTICIPATION LIMITATIONS: meal prep, cleaning, laundry, driving, community activity, occupation, yard work, and travel  PERSONAL FACTORS: Fitness, Past/current experiences, Time since onset of injury/illness/exacerbation, and 3+ comorbidities: see PMH are also affecting patient's functional outcome.   REHAB POTENTIAL: Good  CLINICAL DECISION MAKING: Stable/uncomplicated  EVALUATION COMPLEXITY: Low   GOALS: Goals reviewed with patient? Yes  SHORT TERM GOALS: (target date for Short term goals 07/06/2023)   1.  Patient will demonstrate independent use of home exercise program to maintain progress from in clinic treatments. Baseline: See objective data Goal status: Met 06/30/2023  2. PROM 0* ext to 110* flexion Baseline: See objective data Goal status:  MET  07/13/2023  3. Interim PSFS average >4/10 Baseline: See objective data Goal status: improved but not to target 07/10/2023  LONG TERM GOALS: (target dates for all long term goals 08/02/2023   )   1. Patient will demonstrate/report pain at worst less than or equal to 2/10 to facilitate minimal limitation in daily activity secondary to pain symptoms. Baseline: See objective data Goal status: Ongoing  07/13/2023   2. Patient will demonstrate independent use of home exercise program  to facilitate ability to maintain/progress functional gains from skilled physical therapy services. Baseline: See objective data Goal status: Ongoing 07/13/2023  3.  Patient reports Patient-Specific Activity Score improved the average by 5 to indicate improvement in functional activities.  Baseline: SEE OBJECTIVE DATA Goal status: Ongoing  07/13/2023   4.  Patient will demonstrate LLE MMT 5/5 throughout to faciltiate usual transfers, stairs, squatting at [facility] for daily life.  Baseline: See objective data Goal status: Ongoing   07/13/2023   5.  Patient left knee AROM 0* ext to 110* flexion.  Baseline: See objective data Goal status: Ongoing  07/13/2023   6. Left knee PROM 0* to 120* Baseline: See objective data Goal status: Ongoing   07/13/2023   7.  patient ambulates >500', neg ramps, curbs and stairs 1 rail independently.  Baseline: See objective data Goal status: Ongoing  07/13/2023  PLAN:  PT FREQUENCY:  2x/week  PT DURATION: 8 weeks  PLANNED INTERVENTIONS: 97164- PT Re-evaluation, 97110-Therapeutic exercises, 97530- Therapeutic activity, 97112- Neuromuscular re-education, 97535- Self Care, 46962- Manual therapy, [phone]- Gait training, [phone]- Aquatic Therapy, [phone]- Electrical stimulation (unattended), [phone]- Electrical stimulation (manual), 97016- Vasopneumatic device, Patient/Family education, Balance training, Stair training, Taping, Dry Needling, Joint mobilization, Scar mobilization, Vestibular training, DME instructions, Cryotherapy, Moist heat, and physical performance testing.   PLAN FOR NEXT SESSION: functional strengthening and balance activities,   Monitor and adapt with pain reported from lateral /inferior knee.    [facility] 15 visit authorized.    Lorie Rook, PT, DPT 07/13/2023, 12:49 PM

## 2023-07-13 NOTE — Telephone Encounter (Signed)
 Sent to pharmacy on file.  Frequency has changed

## 2023-07-17 ENCOUNTER — Encounter: Payer: Self-pay | Admitting: Physical Therapy

## 2023-07-17 ENCOUNTER — Ambulatory Visit (INDEPENDENT_AMBULATORY_CARE_PROVIDER_SITE_OTHER): Admitting: Physical Therapy

## 2023-07-17 ENCOUNTER — Encounter: Admitting: Physical Therapy

## 2023-07-17 DIAGNOSIS — G8929 Other chronic pain: Secondary | ICD-10-CM

## 2023-07-17 DIAGNOSIS — R2681 Unsteadiness on feet: Secondary | ICD-10-CM

## 2023-07-17 DIAGNOSIS — R600 Localized edema: Secondary | ICD-10-CM

## 2023-07-17 DIAGNOSIS — M6281 Muscle weakness (generalized): Secondary | ICD-10-CM | POA: Diagnosis not present

## 2023-07-17 DIAGNOSIS — R2689 Other abnormalities of gait and mobility: Secondary | ICD-10-CM | POA: Diagnosis not present

## 2023-07-17 DIAGNOSIS — M25562 Pain in left knee: Secondary | ICD-10-CM

## 2023-07-17 DIAGNOSIS — M25662 Stiffness of left knee, not elsewhere classified: Secondary | ICD-10-CM | POA: Diagnosis not present

## 2023-07-17 NOTE — Therapy (Signed)
 OUTPATIENT PHYSICAL THERAPY TREATMENT NOTE   Patient Name: Jeffrey [name] MRN: [id number] DOB:07/22/1955, 68 y.o., male Today's Date: 07/17/2023  END OF SESSION:  PT End of Session - 07/17/23 1018     Visit Number 13    Number of Visits 15    Date for PT Re-Evaluation 08/03/23    Authorization Type [facility]    Authorization Time Period 15 visits  2/14-8/13/25    Authorization - Visit Number 13    Authorization - Number of Visits 15    Progress Note Due on Visit 15   to reflect VA number   PT Start Time 1015    PT Stop Time 1056    PT Time Calculation (min) 41 min    Activity Tolerance Patient tolerated treatment well;Patient limited by pain    Behavior During Therapy [facility] for tasks assessed/performed                   Past Medical History:  Diagnosis Date   Aneurysm of infrarenal abdominal aorta (HCC)    CAD (coronary artery disease)    4 stents   Carotid artery disease (HCC)    Left ICA   Diabetes mellitus without complication (HCC)    Dysrhythmia    A. Fib   Hyperlipidemia    Intracerebral hemorrhage (HCC) 1960   Peripheral neuropathy    Post-traumatic osteoarthritis of left knee    Past Surgical History:  Procedure Laterality Date   APPENDECTOMY  1960   I & D EXTREMITY Right 06/19/2020   Procedure: IRRIGATION AND DEBRIDEMENT LONG FINGER;  Surgeon: Brunilda Capra, MD;  Location: [facility] OR;  Service: Orthopedics;  Laterality: Right;   LEFT HEART CATH  06/20/2018   ORIF FEMUR FRACTURE  1988   TOTAL KNEE ARTHROPLASTY Left 05/12/2023   Procedure: LEFT TOTAL KNEE ARTHROPLASTY;  Surgeon: Wes Hamman, MD;  Location: [facility] OR;  Service: Orthopedics;  Laterality: Left;   VARICOSE VEIN SURGERY Right    right leg   Patient Active Problem List   Diagnosis Date Noted   Status post total left knee replacement 05/12/2023   Post-traumatic osteoarthritis of left knee 01/31/2023   Atrial fibrillation with rapid ventricular response (HCC) 11/26/2021   Infectious arthropathy (HCC)  06/19/2020   Occlusion and stenosis of carotid artery without mention of cerebral infarction 11/19/2013    PCP: Beecher Bower, MD  REFERRING PROVIDER: Wes Hamman, MD  REFERRING DIAG: [phone] (ICD-10-CM) - H/O total knee replacement, left   THERAPY DIAG:  Chronic pain of left knee  Muscle weakness (generalized)  Stiffness of left knee, not elsewhere classified  Other abnormalities of gait and mobility  Unsteadiness on feet  Localized edema  Rationale for Evaluation and Treatment: Rehabilitation  ONSET DATE: 05/12/2023  Left TKA  SUBJECTIVE:   SUBJECTIVE STATEMENT: He has been doing more activities.  He is using RW today for caution due to rain.    PERTINENT HISTORY: Left TKA 05/12/23, OA Left femur fx (shaft just proximal to distal plate) ORIF 9147 & removal 01/05/22, A-Fib, CAD 4 stents, carotid artery disease, DM, HTN, HLD, Depression, aneurysm of infrarenal abdominal aorta, peripheral neuropathy  PAIN:  NPRS scale: at rest 1-2/10. Since last PT highest  5/10 after exercises Pain location: left knee more on medial / lateral joint line.  Pain description: dull ache, sore, Aggravating factors: WB pressure, consistent symptoms  Relieving factors:  rubbing, pain meds, ice, elevation  PRECAUTIONS: None  WEIGHT BEARING RESTRICTIONS: No  FALLS:  Has  patient fallen in last 6 months? No  LIVING ENVIRONMENT: Lives with: lives with their spouse and dog 55#  lives [place] farm.  Tractor climb.  Lives in: House Stairs: Yes:  External: 5 steps; 2 rails can reach both. Has following equipment at home: Single point cane, Walker - 2 wheeled, Wheelchair (manual), [employer], and Grab bars  OCCUPATION: retired.  Works on farm. Climb tractor.  Handyman work.  PLOF: Independent  PATIENT GOALS:   work on his farm.  Walk around. Travel with RV (bumper pull) needs to set up.    OBJECTIVE:  DIAGNOSTIC FINDINGS: 05/12/23 X-ray following TKA no complications.  Patient-Specific Activity  Scoring Scheme  "0" represents "unable to perform." "10" represents "able to perform at prior level. 0 1 2 3 4 5 6 7 8 9  10 (Date and Score)   Activity Eval  06/06/2023 06/30/23  07/10/23  1. Climb on tractor  0 0  1  2. Walk on paved surfaces 2  5  7   3. Walk on uneven like farm  2 3 4   4. Climb stairs  2 3 3   5. Carry items & standing ADLs 2 3 3   Score 1.6 2.8 avg 3.6   Total score = sum of the activity scores/number of activities Minimum detectable change (90%CI) for average score = 2 points Minimum detectable change (90%CI) for single activity score = 3 points  COGNITION: 06/06/2023 Overall cognitive status: WFL    SENSATION: 06/06/2023 WFL  EDEMA:  Circumferential:   LLE: above knee 47.8cm,  around knee 45.5 cm, below knee 39.6 cm RLE: above knee 43.1 cm,  around knee 41.6 cm, below knee 35.4 cm  POSTURE: 06/06/2023   rounded shoulders, forward head, flexed trunk , and weight shift right  PALPATION: 06/06/2023 Tenderness around left knee joint line, quadriceps tendon and patella tendon.   Scar has decreased mobility especially over patella and tibia.   LOWER EXTREMITY ROM:   ROM Left Eval 06/06/2023 Left 06/12/23 Left 06/22/23 Left  06/30/23 Left 07/13/23  Hip flexion       Hip extension       Hip abduction       Hip adduction       Hip internal rotation       Hip external rotation       Knee flexion Seated A: 94* Supine A: 95* P: 99* Supine AROM heel slide  109 Seated A: 107* P: 111* 105 Active P: 109*  Knee extension Seated  LAQ -30* Supine  Quad set -9* P: -7* -3 in supine quad set  -1 Active P: 0*  Ankle dorsiflexion       Ankle plantarflexion       Ankle inversion       Ankle eversion        (Blank rows = not tested)  LOWER EXTREMITY MMT:  MMT Left Eval 06/06/2023  Hip flexion   Hip extension   Hip abduction   Hip adduction   Hip internal rotation   Hip external rotation   Knee flexion 3-/5  Knee extension 3-/5  Ankle  dorsiflexion   Ankle plantarflexion   Ankle inversion   Ankle eversion    (Blank rows = not tested)  FUNCTIONAL TESTS:  06/06/2023 18 inch chair transfer: heavy use of BUEs on armrests with LLE decreased use.  GAIT: 07/03/2023: Arrived c FWW into clinic.  Able to perform household distances in clinic with SPC in Rt UE with supervision.   06/06/2023 Distance  walked: 100' Assistive device utilized: Walker - 2 wheeled Level of assistance: SBA / cues for deviations.  No balance issue with RW support.  Comments: excessive BUE weight bearing on RW,  left knee flexed in stance, minimal knee flexion and terminal stance and swing.                    TODAY'S TREATMENT                                                                          DATE: 07/13/2023 Therapeutic Exercise: Recumbent bike seat 8 for 8 mins for ROM level 3  Therapeutic Activities: (to improve progressive mobility, squatting, tranfers, ambulation, stairs) Leg press double leg x 15 reps 100 lbs; BLE single leg 50 lbs 15 reps.   PT demo & verbal cues on resistance with weights for exercising outside of PT.  Standing with ipsilateral UE support sink slider in 3 directions (ant-lat, lat & post-lat) with stance knee flexion on outward motion & extension inward BLEs 5 reps ea. Functional squat to pick up items from floor 4 reps with PT cues on technique prior.   Stepping over 6" hurdle alternating LEs for active knee range and SLS 10 reps then over 9" hurdle 10 reps with intermittent UE touch.   Gait Training: PT demo & verbal cues on step width and connection to wt shift over stance LE & balance.  Pt amb along line for visual reference then carryover in open area.   Stairs with right rail alternating pattern 11 steps with verbal cues.      TREATMENT                                                                          DATE: 07/13/2023 Therapeutic Exercise: Recumbent bike seat 9 for 4 mins, seat 8 for 4 mins for ROM.  Lvl 3 8  mins   Therapeutic Activities: (to improve progressive mobility, squatting, tranfers, ambulation, stairs) Leg press double leg x 10 reps 2 sets in available flexion range 87 lbs; BLE single leg 43 lbs 15 reps Standing with ipsilateral UE support sink slider in 3 directions (ant-lat, lat & post-lat) with stance knee flexion on outward motion & extension inward BLEs 5 reps ea. Patient reports limited in standing tolerance.  PT demo & verbal cues on using bar stool (with his ht 29") including sit to/from stand. This would have partial weight on legs and vision/hands/trunk closer to upright than sitting in 18" chair. Pt verbalized understanding.  PT demo & verbal cues on climbing A-frame ladder with modified technique (up with "good" & down with "bad").  This activity is helping with pt goal to get on/off his tractor.  Pt able to climb up & down 2 rungs with PT CGA for safety.    Self-care: Pt questioning use of heat.  PT educated on time to prevent burn and monitor any increases in edema.  Pt verbalized  understanding.   Vasopneumatic Lt knee high pressure 34 deg in elevation with extension prop 10 min.     TREATMENT                                                                          DATE: 07/10/2023 Therapeutic Exercise: Seated LAQ no weight 10 reps, 3# 10 reps, 5# 10 reps Seated hamstring curl green theraband 10 reps 2 sets.  PT demo & verbal cues as HEP. Pt verbalized understanding.  Therapeutic Activities: (to improve progressive mobility, squatting, tranfers, ambulation, stairs) Leg press double leg x 20 in available flexion range 87 lbs , single leg 43 lbs 10 reps 2 sets  Neuromuscular Reeducation (to improve neuromuscular recruitment, balance) Tandem stance LLE in front & in back 30 sec: on floor eyes open with only 1-2 touches //bars; on floor eyes closed with frequent touch //bars; on foam beam eyes open with frequent touch //bars. Discussed at HEP near sink. Pt verbalized  understanding.  Standing with ipsilateral UE support sink slider in Y directions with stance knee flexion on outward motion & extension inward BLEs 5 reps ea.   Vasopneumatic Lt knee high pressure 34 deg in elevation.      PATIENT EDUCATION:  Education details: HEP, POC Person educated: Patient Education method: Programmer, multimedia, Demonstration, Verbal cues, and Handouts Education comprehension: verbalized understanding, returned demonstration, and verbal cues required  HOME EXERCISE PROGRAM: Access Code: [email or web address] URL: [email or web address] Date: 06/19/2023 Prepared by: Lorie Rook  Exercises - Ankle Alphabet in Elevation  - 2-4 x daily - 7 x weekly - 1 sets - 1 reps - Quad Setting and Stretching  - 2-4 x daily - 7 x weekly - 5-10 sets - 10 reps - prop 5-10 minutes & quad set5 seconds hold - Supine Heel Slide with Strap  - 2-3 x daily - 7 x weekly - 2-3 sets - 10 reps - 5 seconds hold - Supine Straight Leg Raises  - 2-3 x daily - 7 x weekly - 2-3 sets - 10 reps - 5 seconds hold - Seated Knee Flexion Extension AROM   - 2-4 x daily - 7 x weekly - 2-3 sets - 10 reps - 5 seconds hold - Seated straight leg lifts  - 2-3 x daily - 7 x weekly - 2-3 sets - 10 reps - 5 seconds hold - Tandem Stance  - 1 x daily - 7 x weekly - 1 sets - 5 reps - 20 second hold - Small Range Straight Leg Raise  - 2 x daily - 7 x weekly - 3-5 sets - 5 reps - 3 seconds hold - Hooklying Hamstring Stretch with Strap  - 1-2 x daily - 7 x weekly - 1 sets - 2-3 reps - 30 seconds hold - Gastroc Stretch on Step  - 1-2 x daily - 7 x weekly - 1 sets - 2-3 reps - 30 seconds hold - Supine Quadriceps Stretch with Strap on Table  - 1-2 x daily - 7 x weekly - 1 sets - 2-3 reps - 30 seconds hold  Patient Education - Scar Massage    ASSESSMENT: CLINICAL IMPRESSION: Patient is reporting improved functional activities with pain slowly decreasing.   He improved gait  with PT cues on step width.   OBJECTIVE  IMPAIRMENTS: Abnormal gait, decreased activity tolerance, decreased balance, decreased endurance, decreased knowledge of condition, decreased knowledge of use of DME, decreased mobility, difficulty walking, decreased ROM, decreased strength, increased edema, postural dysfunction, and pain.   ACTIVITY LIMITATIONS: carrying, lifting, bending, standing, squatting, sleeping, stairs, transfers, locomotion level, and climbing on farm equipment  PARTICIPATION LIMITATIONS: meal prep, cleaning, laundry, driving, community activity, occupation, yard work, and travel  PERSONAL FACTORS: Fitness, Past/current experiences, Time since onset of injury/illness/exacerbation, and 3+ comorbidities: see PMH are also affecting patient's functional outcome.   REHAB POTENTIAL: Good  CLINICAL DECISION MAKING: Stable/uncomplicated  EVALUATION COMPLEXITY: Low   GOALS: Goals reviewed with patient? Yes  SHORT TERM GOALS: (target date for Short term goals 07/06/2023)   1.  Patient will demonstrate independent use of home exercise program to maintain progress from in clinic treatments. Baseline: See objective data Goal status: Met 06/30/2023  2. PROM 0* ext to 110* flexion Baseline: See objective data Goal status:  MET  07/13/2023  3. Interim PSFS average >4/10 Baseline: See objective data Goal status: improved but not to target 07/10/2023  LONG TERM GOALS: (target dates for all long term goals 08/02/2023   )   1. Patient will demonstrate/report pain at worst less than or equal to 2/10 to facilitate minimal limitation in daily activity secondary to pain symptoms. Baseline: See objective data Goal status: Ongoing  07/13/2023   2. Patient will demonstrate independent use of home exercise program to facilitate ability to maintain/progress functional gains from skilled physical therapy services. Baseline: See objective data Goal status: Ongoing 07/13/2023  3.  Patient reports Patient-Specific Activity Score improved the  average by 5 to indicate improvement in functional activities.  Baseline: SEE OBJECTIVE DATA Goal status: Ongoing  07/13/2023   4.  Patient will demonstrate LLE MMT 5/5 throughout to faciltiate usual transfers, stairs, squatting at [facility] for daily life.  Baseline: See objective data Goal status: Ongoing   07/13/2023   5.  Patient left knee AROM 0* ext to 110* flexion.  Baseline: See objective data Goal status: Ongoing  07/13/2023   6. Left knee PROM 0* to 120* Baseline: See objective data Goal status: Ongoing   07/13/2023   7.  patient ambulates >500', neg ramps, curbs and stairs 1 rail independently.  Baseline: See objective data Goal status: Ongoing  07/13/2023  PLAN:  PT FREQUENCY:  2x/week  PT DURATION: 8 weeks  PLANNED INTERVENTIONS: 97164- PT Re-evaluation, 97110-Therapeutic exercises, 97530- Therapeutic activity, 97112- Neuromuscular re-education, 97535- Self Care, 69629- Manual therapy, [phone]- Gait training, [phone]- Aquatic Therapy, [phone]- Electrical stimulation (unattended), [phone]- Electrical stimulation (manual), 97016- Vasopneumatic device, Patient/Family education, Balance training, Stair training, Taping, Dry Needling, Joint mobilization, Scar mobilization, Vestibular training, DME instructions, Cryotherapy, Moist heat, and physical performance testing.   PLAN FOR NEXT SESSION:    needs VA re-authorization in next 2 visits,  functional strengthening and balance activities,   Monitor and adapt with pain reported from lateral /inferior knee.    [facility] 15 visit authorized.    Katriel Cutsforth, PT, DPT 07/17/2023, 3:10 PM

## 2023-07-20 ENCOUNTER — Encounter: Payer: Self-pay | Admitting: Physical Therapy

## 2023-07-20 ENCOUNTER — Encounter: Admitting: Physical Therapy

## 2023-07-20 ENCOUNTER — Ambulatory Visit (INDEPENDENT_AMBULATORY_CARE_PROVIDER_SITE_OTHER): Admitting: Physical Therapy

## 2023-07-20 DIAGNOSIS — R2689 Other abnormalities of gait and mobility: Secondary | ICD-10-CM

## 2023-07-20 DIAGNOSIS — R600 Localized edema: Secondary | ICD-10-CM

## 2023-07-20 DIAGNOSIS — M25562 Pain in left knee: Secondary | ICD-10-CM | POA: Diagnosis not present

## 2023-07-20 DIAGNOSIS — M25662 Stiffness of left knee, not elsewhere classified: Secondary | ICD-10-CM | POA: Diagnosis not present

## 2023-07-20 DIAGNOSIS — R2681 Unsteadiness on feet: Secondary | ICD-10-CM

## 2023-07-20 DIAGNOSIS — M6281 Muscle weakness (generalized): Secondary | ICD-10-CM | POA: Diagnosis not present

## 2023-07-20 DIAGNOSIS — G8929 Other chronic pain: Secondary | ICD-10-CM

## 2023-07-20 NOTE — Therapy (Signed)
 OUTPATIENT PHYSICAL THERAPY TREATMENT NOTE   Patient Name: Jeffrey [name] MRN: [id number] DOB:Jul 30, 1955, 68 y.o., male Today's Date: 07/20/2023  END OF SESSION:  PT End of Session - 07/20/23 1145     Visit Number 14    Number of Visits 15    Date for PT Re-Evaluation 08/03/23    Authorization Type VA    Authorization Time Period 15 visits  2/14-8/13/25    Authorization - Visit Number 14    Authorization - Number of Visits 15    Progress Note Due on Visit 15   to reflect VA number   PT Start Time 1145    PT Stop Time 1223    PT Time Calculation (min) 38 min    Activity Tolerance Patient tolerated treatment well;Patient limited by pain    Behavior During Therapy [facility] for tasks assessed/performed                    Past Medical History:  Diagnosis Date   Aneurysm of infrarenal abdominal aorta (HCC)    CAD (coronary artery disease)    4 stents   Carotid artery disease (HCC)    Left ICA   Diabetes mellitus without complication (HCC)    Dysrhythmia    A. Fib   Hyperlipidemia    Intracerebral hemorrhage (HCC) 1960   Peripheral neuropathy    Post-traumatic osteoarthritis of left knee    Past Surgical History:  Procedure Laterality Date   APPENDECTOMY  1960   I & D EXTREMITY Right 06/19/2020   Procedure: IRRIGATION AND DEBRIDEMENT LONG FINGER;  Surgeon: Brunilda Capra, MD;  Location: [facility] OR;  Service: Orthopedics;  Laterality: Right;   LEFT HEART CATH  06/20/2018   ORIF FEMUR FRACTURE  1988   TOTAL KNEE ARTHROPLASTY Left 05/12/2023   Procedure: LEFT TOTAL KNEE ARTHROPLASTY;  Surgeon: Wes Hamman, MD;  Location: [facility] OR;  Service: Orthopedics;  Laterality: Left;   VARICOSE VEIN SURGERY Right    right leg   Patient Active Problem List   Diagnosis Date Noted   Status post total left knee replacement 05/12/2023   Post-traumatic osteoarthritis of left knee 01/31/2023   Atrial fibrillation with rapid ventricular response (HCC) 11/26/2021   Infectious arthropathy  (HCC) 06/19/2020   Occlusion and stenosis of carotid artery without mention of cerebral infarction 11/19/2013    PCP: Beecher Bower, MD  REFERRING PROVIDER: Wes Hamman, MD  REFERRING DIAG: [phone] (ICD-10-CM) - H/O total knee replacement, left   THERAPY DIAG:  Chronic pain of left knee  Muscle weakness (generalized)  Stiffness of left knee, not elsewhere classified  Other abnormalities of gait and mobility  Unsteadiness on feet  Localized edema  Rationale for Evaluation and Treatment: Rehabilitation  ONSET DATE: 05/12/2023  Left TKA  SUBJECTIVE:   SUBJECTIVE STATEMENT: He was able to 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  He is going to try to use tractor to get up hay this weekend.  He is planning to use camper in June.      PERTINENT HISTORY: Left TKA 05/12/23, OA Left femur fx (shaft just proximal to distal plate) ORIF 9147 & removal 01/05/22, A-Fib, CAD 4 stents, carotid artery disease, DM, HTN, HLD, Depression, aneurysm of infrarenal abdominal aorta, peripheral neuropathy  PAIN:  NPRS scale: at rest 0-1/10. Since last PT highest 2-3/10 after exercises Pain location: left knee more on medial / lateral joint line.  Pain description: dull ache, sore, Aggravating factors:  WB pressure, consistent symptoms  Relieving factors:  rubbing, tylenol , ice, elevation  PRECAUTIONS: None  WEIGHT BEARING RESTRICTIONS: No  FALLS:  Has patient [name] in last 6 months? No  LIVING ENVIRONMENT: Lives with: lives with their spouse and dog 55#  lives on farm.  Tractor climb.  Lives in: House Stairs: Yes:  External: 5 steps; 2 rails can reach both. Has following equipment at home: Single point cane, Walker - 2 wheeled, Wheelchair (manual), [employer], and Grab bars  OCCUPATION: retired.  Works on farm. Climb tractor.  Handyman work.  PLOF: Independent  PATIENT GOALS:   work on his farm.  Walk around. Travel with RV (bumper pull) needs to set up.     OBJECTIVE:  DIAGNOSTIC FINDINGS: 05/12/23 X-ray following TKA no complications.  Patient-Specific Activity Scoring Scheme  "0" represents "unable to perform." "10" represents "able to perform at prior level. 0 1 2 3 4 5 6 7 8 9  10 (Date and Score)   Activity Eval  06/06/2023 06/30/23  07/10/23  1. Climb on tractor  0 0  1  2. Walk on paved surfaces 2  5  7   3. Walk on uneven like farm  2 3 4   4. Climb stairs  2 3 3   5. Carry items & standing ADLs 2 3 3   Score 1.6 2.8 avg 3.6   Total score = sum of the activity scores/number of activities Minimum detectable change (90%CI) for average score = 2 points Minimum detectable change (90%CI) for single activity score = 3 points  COGNITION: 06/06/2023 Overall cognitive status: WFL    SENSATION: 06/06/2023 WFL  EDEMA:  Circumferential:   LLE: above knee 47.8cm,  around knee 45.5 cm, below knee 39.6 cm RLE: above knee 43.1 cm,  around knee 41.6 cm, below knee 35.4 cm  POSTURE: 06/06/2023   rounded shoulders, forward head, flexed trunk , and weight shift right  PALPATION: 06/06/2023 Tenderness around left knee joint line, quadriceps tendon and patella tendon.   Scar has decreased mobility especially over patella and tibia.   LOWER EXTREMITY ROM:   ROM Left Eval 06/06/2023 Left 06/12/23 Left 06/22/23 Left  06/30/23 Left 07/13/23  Hip flexion       Hip extension       Hip abduction       Hip adduction       Hip internal rotation       Hip external rotation       Knee flexion Seated A: 94* Supine A: 95* P: 99* Supine AROM heel slide  109 Seated A: 107* P: 111* 105 Active P: 109*  Knee extension Seated  LAQ -30* Supine  Quad set -9* P: -7* -3 in supine quad set  -1 Active P: 0*  Ankle dorsiflexion       Ankle plantarflexion       Ankle inversion       Ankle eversion        (Blank rows = not tested)  LOWER EXTREMITY MMT:  MMT Left Eval 06/06/2023  Hip flexion   Hip extension   Hip abduction   Hip adduction    Hip internal rotation   Hip external rotation   Knee flexion 3-/5  Knee extension 3-/5  Ankle dorsiflexion   Ankle plantarflexion   Ankle inversion   Ankle eversion    (Blank rows = not tested)  FUNCTIONAL TESTS:  06/06/2023 18 inch chair transfer: heavy use of BUEs on armrests with LLE decreased use.  GAIT: 07/03/2023:  Arrived c FWW into clinic.  Able to perform household distances in clinic with SPC in Rt UE with supervision.   06/06/2023 Distance walked: 100' Assistive device utilized: Environmental consultant - 2 wheeled Level of assistance: SBA / cues for deviations.  No balance issue with RW support.  Comments: excessive BUE weight bearing on RW,  left knee flexed in stance, minimal knee flexion and terminal stance and swing.                    TODAY'S TREATMENT                                                                          DATE: 07/20/2023 Therapeutic Exercise: Recumbent bike seat 8 for 8 mins for ROM level 5  Therapeutic Activities: (to improve progressive mobility, squatting, tranfers, ambulation, stairs) PT recommended hooking up RV, move to location in yard, set up "camp" then "break" camp and return to where he stores it.  PT wants him to let us  know problem areas that his knee will effect. Pt verbalized understanding.  Pt neg flight 11 steps alternating pattern all with supervision / verbal cues on technique mainly hand position to improve weight shift - 1st flight with 2 rails, 2nd flight contralateral rail and 3rd flight ipsilateral rail.  Pt reports knee was tired but not sore.   PT demo & verbal cues on floor transfer via half kneeling (onto foam pad for comfort) - pt performed between 2 chairs for BUE support 2 reps with ea LLE & RLE forward LE;  pt performed 2 reps with BUEs on one chair seat 2 reps with ea LLE & RLE forward LE.  Pt verbalized understanding and working on 2-5 reps to build strength for this skill (needs for farm & camping) to be less dependent on UE  assist.  Pt verbalized understanding.   PT reviewed LTGs including their rationale.  He appears close to meeting them with plans to check next session.     TREATMENT                                                                          DATE: 07/17/2023 Therapeutic Exercise: Recumbent bike seat 8 for 8 mins for ROM level 3  Therapeutic Activities: (to improve progressive mobility, squatting, tranfers, ambulation, stairs) Leg press double leg x 15 reps 100 lbs; BLE single leg 50 lbs 15 reps.   PT demo & verbal cues on resistance with weights for exercising outside of PT.  Standing with ipsilateral UE support sink slider in 3 directions (ant-lat, lat & post-lat) with stance knee flexion on outward motion & extension inward BLEs 5 reps ea. Functional squat to pick up items from floor 4 reps with PT cues on technique prior.   Stepping over 6" hurdle alternating LEs for active knee range and SLS 10 reps then over 9" hurdle 10 reps with intermittent UE touch.   Gait Training: PT demo &  verbal cues on step width and connection to wt shift over stance LE & balance.  Pt amb along line for visual reference then carryover in open area.   Stairs with right rail alternating pattern 11 steps with verbal cues.      TREATMENT                                                                          DATE: 07/13/2023 Therapeutic Exercise: Recumbent bike seat 9 for 4 mins, seat 8 for 4 mins for ROM.  Lvl 3 8 mins   Therapeutic Activities: (to improve progressive mobility, squatting, tranfers, ambulation, stairs) Leg press double leg x 10 reps 2 sets in available flexion range 87 lbs; BLE single leg 43 lbs 15 reps Standing with ipsilateral UE support sink slider in 3 directions (ant-lat, lat & post-lat) with stance knee flexion on outward motion & extension inward BLEs 5 reps ea. Patient reports limited in standing tolerance.  PT demo & verbal cues on using bar stool (with his ht 29") including sit to/from  stand. This would have partial weight on legs and vision/hands/trunk closer to upright than sitting in 18" chair. Pt verbalized understanding.  PT demo & verbal cues on climbing A-frame ladder with modified technique (up with "good" & down with "bad").  This activity is helping with pt goal to get on/off his tractor.  Pt able to climb up & down 2 rungs with PT CGA for safety.    Self-care: Pt questioning use of heat.  PT educated on time to prevent burn and monitor any increases in edema.  Pt verbalized understanding.   Vasopneumatic Lt knee high pressure 34 deg in elevation with extension prop 10 min.       PATIENT EDUCATION:  Education details: HEP, POC Person educated: Patient Education method: Programmer, multimedia, Demonstration, Verbal cues, and Handouts Education comprehension: verbalized understanding, returned demonstration, and verbal cues required  HOME EXERCISE PROGRAM: Access Code: [email or web address] URL: [email or web address] Date: 06/19/2023 Prepared by: Lorie Rook  Exercises - Ankle Alphabet in Elevation  - 2-4 x daily - 7 x weekly - 1 sets - 1 reps - Quad Setting and Stretching  - 2-4 x daily - 7 x weekly - 5-10 sets - 10 reps - prop 5-10 minutes & quad set5 seconds hold - Supine Heel Slide with Strap  - 2-3 x daily - 7 x weekly - 2-3 sets - 10 reps - 5 seconds hold - Supine Straight Leg Raises  - 2-3 x daily - 7 x weekly - 2-3 sets - 10 reps - 5 seconds hold - Seated Knee Flexion Extension AROM   - 2-4 x daily - 7 x weekly - 2-3 sets - 10 reps - 5 seconds hold - Seated straight leg lifts  - 2-3 x daily - 7 x weekly - 2-3 sets - 10 reps - 5 seconds hold - Tandem Stance  - 1 x daily - 7 x weekly - 1 sets - 5 reps - 20 second hold - Small Range Straight Leg Raise  - 2 x daily - 7 x weekly - 3-5 sets - 5 reps - 3 seconds hold - Hooklying Hamstring Stretch with Strap  - 1-2 x daily - 7  x weekly - 1 sets - 2-3 reps - 30 seconds hold - Gastroc Stretch on Step  - 1-2 x daily  - 7 x weekly - 1 sets - 2-3 reps - 30 seconds hold - Supine Quadriceps Stretch with Strap on Table  - 1-2 x daily - 7 x weekly - 1 sets - 2-3 reps - 30 seconds hold  Patient Education - Scar Massage    ASSESSMENT: CLINICAL IMPRESSION: Patient has improved function and pain significantly in last week.  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  He appears close to meeting LTGs.   OBJECTIVE IMPAIRMENTS: Abnormal gait, decreased activity tolerance, decreased balance, decreased endurance, decreased knowledge of condition, decreased knowledge of use of DME, decreased mobility, difficulty walking, decreased ROM, decreased strength, increased edema, postural dysfunction, and pain.   ACTIVITY LIMITATIONS: carrying, lifting, bending, standing, squatting, sleeping, stairs, transfers, locomotion level, and climbing on farm equipment  PARTICIPATION LIMITATIONS: meal prep, cleaning, laundry, driving, community activity, occupation, yard work, and travel  PERSONAL FACTORS: Fitness, Past/current experiences, Time since onset of injury/illness/exacerbation, and 3+ comorbidities: see PMH are also affecting patient's functional outcome.   REHAB POTENTIAL: Good  CLINICAL DECISION MAKING: Stable/uncomplicated  EVALUATION COMPLEXITY: Low   GOALS: Goals reviewed with patient? Yes  SHORT TERM GOALS: (target date for Short term goals 07/06/2023)   1.  Patient will demonstrate independent use of home exercise program to maintain progress from in clinic treatments. Baseline: See objective data Goal status: Met 06/30/2023  2. PROM 0* ext to 110* flexion Baseline: See objective data Goal status:  MET  07/13/2023  3. Interim PSFS average >4/10 Baseline: See objective data Goal status: improved but not to target 07/20/2023  LONG TERM GOALS: (target dates for all long term goals 08/02/2023   )   1. Patient will demonstrate/report pain at worst less than or equal to 2/10 to  facilitate minimal limitation in daily activity secondary to pain symptoms. Baseline: See objective data Goal status: Ongoing  07/20/2023   2. Patient will demonstrate independent use of home exercise program to facilitate ability to maintain/progress functional gains from skilled physical therapy services. Baseline: See objective data Goal status: Ongoing 07/20/2023  3.  Patient reports Patient-Specific Activity Score improved the average by 5 to indicate improvement in functional activities.  Baseline: SEE OBJECTIVE DATA Goal status: Ongoing  07/20/2023   4.  Patient will demonstrate LLE MMT 5/5 throughout to faciltiate usual transfers, stairs, squatting at [facility] for daily life.  Baseline: See objective data Goal status: Ongoing   07/20/2023   5.  Patient left knee AROM 0* ext to 110* flexion.  Baseline: See objective data Goal status: Ongoing  07/20/2023   6. Left knee PROM 0* to 120* Baseline: See objective data Goal status: Ongoing   07/20/2023   7.  patient ambulates >500', neg ramps, curbs and stairs 1 rail independently.  Baseline: See objective data Goal status: Ongoing  07/20/2023  PLAN:  PT FREQUENCY:  2x/week  PT DURATION: 8 weeks  PLANNED INTERVENTIONS: 97164- PT Re-evaluation, 97110-Therapeutic exercises, 97530- Therapeutic activity, 97112- Neuromuscular re-education, 97535- Self Care, 16109- Manual therapy, [phone]- Gait training, [phone]- Aquatic Therapy, [phone]- Electrical stimulation (unattended), [phone]- Electrical stimulation (manual), 97016- Vasopneumatic device, Patient/Family education, Balance training, Stair training, Taping, Dry Needling, Joint mobilization, Scar mobilization, Vestibular training, DME instructions, Cryotherapy, Moist heat, and physical performance testing.   PLAN FOR NEXT SESSION:   check LTGs.  Ask how using tractor and moving camper  went.  If needs more PT appts, needs VA re-authorization.   VA 15 visit authorized.    Lorie Rook, PT,  DPT 07/20/2023, 12:41 PM

## 2023-07-27 ENCOUNTER — Encounter: Payer: Self-pay | Admitting: Physical Therapy

## 2023-07-27 ENCOUNTER — Ambulatory Visit (INDEPENDENT_AMBULATORY_CARE_PROVIDER_SITE_OTHER): Admitting: Physical Therapy

## 2023-07-27 DIAGNOSIS — M6281 Muscle weakness (generalized): Secondary | ICD-10-CM

## 2023-07-27 DIAGNOSIS — M25662 Stiffness of left knee, not elsewhere classified: Secondary | ICD-10-CM

## 2023-07-27 DIAGNOSIS — M25562 Pain in left knee: Secondary | ICD-10-CM

## 2023-07-27 DIAGNOSIS — R600 Localized edema: Secondary | ICD-10-CM

## 2023-07-27 DIAGNOSIS — R2689 Other abnormalities of gait and mobility: Secondary | ICD-10-CM

## 2023-07-27 DIAGNOSIS — R2681 Unsteadiness on feet: Secondary | ICD-10-CM

## 2023-07-27 DIAGNOSIS — G8929 Other chronic pain: Secondary | ICD-10-CM

## 2023-07-27 NOTE — Therapy (Signed)
 OUTPATIENT PHYSICAL THERAPY TREATMENT NOTE & DISCHARGE SUMMARY   Patient Name: Jeffrey [name] MRN: [id number] DOB:01-Aug-1955, 68 y.o., male Today's Date: 07/27/2023  PHYSICAL THERAPY DISCHARGE SUMMARY  Visits from Start of Care: 15  Current functional level related to goals / functional outcomes: See below   Remaining deficits: See below   Education / Equipment: Patient appears to understand ongoing HEP  Patient agrees to discharge. Patient goals were met. Patient is being discharged due to meeting the stated rehab goals.   END OF SESSION:  PT End of Session - 07/27/23 1347     Visit Number 15    Number of Visits 15    Date for PT Re-Evaluation 08/03/23    Authorization Type [facility]    Authorization Time Period 15 visits  2/14-8/13/25    Authorization - Visit Number 15    Authorization - Number of Visits 15    Progress Note Due on Visit 15   to reflect [facility] number   PT Start Time 1345    PT Stop Time 1413    PT Time Calculation (min) 28 min    Activity Tolerance Patient tolerated treatment well;Patient limited by pain    Behavior During Therapy WFL for tasks assessed/performed                     Past Medical History:  Diagnosis Date   Aneurysm of infrarenal abdominal aorta (HCC)    CAD (coronary artery disease)    4 stents   Carotid artery disease (HCC)    Left ICA   Diabetes mellitus without complication (HCC)    Dysrhythmia    A. Fib   Hyperlipidemia    Intracerebral hemorrhage (HCC) 1960   Peripheral neuropathy    Post-traumatic osteoarthritis of left knee    Past Surgical History:  Procedure Laterality Date   APPENDECTOMY  1960   I & D EXTREMITY Right 06/19/2020   Procedure: IRRIGATION AND DEBRIDEMENT LONG FINGER;  Surgeon: Brunilda Capra, MD;  Location: [facility] OR;  Service: Orthopedics;  Laterality: Right;   LEFT HEART CATH  06/20/2018   ORIF FEMUR FRACTURE  1988   TOTAL KNEE ARTHROPLASTY Left 05/12/2023   Procedure: LEFT TOTAL KNEE ARTHROPLASTY;   Surgeon: Wes Hamman, MD;  Location: [facility] OR;  Service: Orthopedics;  Laterality: Left;   VARICOSE VEIN SURGERY Right    right leg   Patient Active Problem List   Diagnosis Date Noted   Status post total left knee replacement 05/12/2023   Post-traumatic osteoarthritis of left knee 01/31/2023   Atrial fibrillation with rapid ventricular response (HCC) 11/26/2021   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   Occlusion and stenosis of carotid artery without mention of cerebral infarction 11/19/2013    PCP: Beecher Bower, MD  REFERRING PROVIDER: Wes Hamman, MD  REFERRING DIAG: [phone] (ICD-10-CM) - H/O total knee replacement, left   THERAPY DIAG:  Chronic pain of left knee  Muscle weakness (generalized)  Stiffness of left knee, not elsewhere classified  Other abnormalities of gait and mobility  Unsteadiness on feet  Localized edema  Rationale for Evaluation and Treatment: Rehabilitation  ONSET DATE: 05/12/2023  Left TKA  SUBJECTIVE:   SUBJECTIVE STATEMENT: No issues with moving & setting up RV and climb on/off tractor.   PERTINENT HISTORY: Left TKA 05/12/23, OA Left femur fx (shaft just proximal to distal plate) ORIF 4403 & removal 01/05/22, A-Fib, CAD 4 stents, carotid artery disease, DM, HTN, HLD, Depression, aneurysm of infrarenal abdominal aorta, peripheral  neuropathy  PAIN:  NPRS scale: at rest   0/10. Since last PT mainly 1-2/10,   highest 4/10 after working all day on his feet Pain location: left knee more on medial / lateral joint line.  Pain description: dull ache, sore, Aggravating factors: WB pressure, consistent symptoms  Relieving factors:  rubbing, tylenol , ice, elevation  PRECAUTIONS: None  WEIGHT BEARING RESTRICTIONS: No  FALLS:  Has patient fallen in last 6 months? No  LIVING ENVIRONMENT: Lives with: lives with their spouse and dog 55#  lives on farm.  Tractor climb.  Lives in: House Stairs: Yes:  External: 5 steps; 2 rails can reach both. Has  following equipment at home: Single point cane, Walker - 2 wheeled, Wheelchair (manual), [employer], and Grab bars  OCCUPATION: retired.  Works on farm. Climb tractor.  Handyman work.  PLOF: Independent  PATIENT GOALS:   work on his farm.  Walk around. Travel with RV (bumper pull) needs to set up.    OBJECTIVE:  DIAGNOSTIC FINDINGS: 05/12/23 X-ray following TKA no complications.  Patient-Specific Activity Scoring Scheme  "0" represents "unable to perform." "10" represents "able to perform at prior level. 0 1 2 3 4 5 6 7 8 9  10 (Date and Score)   Activity Eval  06/06/2023 06/30/23  07/10/23 07/27/23  1. Climb on tractor  0 0  1 8  2. Walk on paved surfaces 2  5  7 8   3. Walk on uneven like farm  2 3 4 8   4. Climb stairs  2 3 3 6   5. Carry items & standing ADLs 2 3 3 8   Score 1.6 2.8 avg 3.6 7.6   Total score = sum of the activity scores/number of activities Minimum detectable change (90%CI) for average score = 2 points Minimum detectable change (90%CI) for single activity score = 3 points  COGNITION: 06/06/2023 Overall cognitive status: WFL    SENSATION: 06/06/2023 WFL  EDEMA:  Circumferential:   LLE: above knee 47.8cm,  around knee 45.5 cm, below knee 39.6 cm RLE: above knee 43.1 cm,  around knee 41.6 cm, below knee 35.4 cm  POSTURE: 06/06/2023   rounded shoulders, forward head, flexed trunk , and weight shift right  PALPATION: 06/06/2023 Tenderness around left knee joint line, quadriceps tendon and patella tendon.   Scar has decreased mobility especially over patella and tibia.   LOWER EXTREMITY ROM:   ROM Left Eval 06/06/2023 Left 06/12/23 Left 06/22/23 Left  06/30/23 Left 07/13/23 Left 07/27/23  Hip flexion        Hip extension        Hip abduction        Hip adduction        Hip internal rotation        Hip external rotation        Knee flexion Seated A: 94* Supine A: 95* P: 99* Supine AROM heel slide  109 Seated A: 107* P: 111* 105 Active P: 109*  Supine A: 110* P: 120*  Knee extension Seated  LAQ -30* Supine  Quad set -9* P: -7* -3 in supine quad set  -1 Active P: 0* Supine A: 0* Seated LAQ -5*  Ankle dorsiflexion        Ankle plantarflexion        Ankle inversion        Ankle eversion         (Blank rows = not tested)  LOWER EXTREMITY MMT:  MMT Left Eval 06/06/23 Right 07/27/23 Left  07/27/23  Hip flexion     Hip extension     Hip abduction     Hip adduction     Hip internal rotation     Hip external rotation     Knee flexion 3-/5  5/5  Knee extension 3-/5 HH dynameter 47.1#, 46.9# HH Dynameter 39.8#,  40.3# LLE:RLE 85%  Ankle dorsiflexion     Ankle plantarflexion     Ankle inversion     Ankle eversion      (Blank rows = not tested)  FUNCTIONAL TESTS:  06/06/2023 18 inch chair transfer: heavy use of BUEs on armrests with LLE decreased use.  GAIT: 07/27/2023: pt amb >500' including reporting on uneven terrain of his farm, negotiating ramps, curbs and stairs with rail if >4 steps.    07/03/2023: Arrived c FWW into clinic.  Able to perform household distances in clinic with SPC in Rt UE with supervision.   06/06/2023 Distance walked: 100' Assistive device utilized: Environmental consultant - 2 wheeled Level of assistance: SBA / cues for deviations.  No balance issue with RW support.  Comments: excessive BUE weight bearing on RW,  left knee flexed in stance, minimal knee flexion and terminal stance and swing.                    TODAY'S TREATMENT                                                                          DATE: 07/27/2023 Therapeutic Exercise: [employer] Recumbent bike seat 12 BLEs only for 8 mins for ROM level 5 Supine heel slides 25 reps  See objective data & LTGs   TREATMENT                                                                          DATE: 07/20/2023 Therapeutic Exercise: Recumbent bike seat 8 for 8 mins for ROM level 5  Therapeutic Activities: (to improve progressive mobility, squatting,  tranfers, ambulation, stairs) PT recommended hooking up RV, move to location in yard, set up "camp" then "break" camp and return to where he stores it.  PT wants him to let us  know problem areas that his knee will effect. Pt verbalized understanding.  Pt neg flight 11 steps alternating pattern all with supervision / verbal cues on technique mainly hand position to improve weight shift - 1st flight with 2 rails, 2nd flight contralateral rail and 3rd flight ipsilateral rail.  Pt reports knee was tired but not sore.   PT demo & verbal cues on floor transfer via half kneeling (onto foam pad for comfort) - pt performed between 2 chairs for BUE support 2 reps with ea LLE & RLE forward LE;  pt performed 2 reps with BUEs on one chair seat 2 reps with ea LLE & RLE forward LE.  Pt verbalized understanding and working on 2-5 reps to build strength for this skill (needs for farm & camping) to be  less dependent on UE assist.  Pt verbalized understanding.   PT reviewed LTGs including their rationale.  He appears close to meeting them with plans to check next session.     TREATMENT                                                                          DATE: 07/17/2023 Therapeutic Exercise: Recumbent bike seat 8 for 8 mins for ROM level 3  Therapeutic Activities: (to improve progressive mobility, squatting, tranfers, ambulation, stairs) Leg press double leg x 15 reps 100 lbs; BLE single leg 50 lbs 15 reps.   PT demo & verbal cues on resistance with weights for exercising outside of PT.  Standing with ipsilateral UE support sink slider in 3 directions (ant-lat, lat & post-lat) with stance knee flexion on outward motion & extension inward BLEs 5 reps ea. Functional squat to pick up items from floor 4 reps with PT cues on technique prior.   Stepping over 6" hurdle alternating LEs for active knee range and SLS 10 reps then over 9" hurdle 10 reps with intermittent UE touch.   Gait Training: PT demo & verbal cues on  step width and connection to wt shift over stance LE & balance.  Pt amb along line for visual reference then carryover in open area.   Stairs with right rail alternating pattern 11 steps with verbal cues.       PATIENT EDUCATION:  Education details: HEP, POC Person educated: Patient Education method: Programmer, multimedia, Demonstration, Verbal cues, and Handouts Education comprehension: verbalized understanding, returned demonstration, and verbal cues required  HOME EXERCISE PROGRAM: Access Code: [email or web address] URL: [email or web address] Date: 06/19/2023 Prepared by: Lorie Rook  Exercises - Ankle Alphabet in Elevation  - 2-4 x daily - 7 x weekly - 1 sets - 1 reps - Quad Setting and Stretching  - 2-4 x daily - 7 x weekly - 5-10 sets - 10 reps - prop 5-10 minutes & quad set5 seconds hold - Supine Heel Slide with Strap  - 2-3 x daily - 7 x weekly - 2-3 sets - 10 reps - 5 seconds hold - Supine Straight Leg Raises  - 2-3 x daily - 7 x weekly - 2-3 sets - 10 reps - 5 seconds hold - Seated Knee Flexion Extension AROM   - 2-4 x daily - 7 x weekly - 2-3 sets - 10 reps - 5 seconds hold - Seated straight leg lifts  - 2-3 x daily - 7 x weekly - 2-3 sets - 10 reps - 5 seconds hold - Tandem Stance  - 1 x daily - 7 x weekly - 1 sets - 5 reps - 20 second hold - Small Range Straight Leg Raise  - 2 x daily - 7 x weekly - 3-5 sets - 5 reps - 3 seconds hold - Hooklying Hamstring Stretch with Strap  - 1-2 x daily - 7 x weekly - 1 sets - 2-3 reps - 30 seconds hold - Gastroc Stretch on Step  - 1-2 x daily - 7 x weekly - 1 sets - 2-3 reps - 30 seconds hold - Supine Quadriceps Stretch with Strap on Table  - 1-2 x daily - 7 x weekly - 1  sets - 2-3 reps - 30 seconds hold  Patient Education - Scar Massage    ASSESSMENT: CLINICAL IMPRESSION: Patient met all LTGs and appears to be functioning well with his TKA.  He appears to understand ongoing HEP with need to continue exercising.    OBJECTIVE  IMPAIRMENTS: Abnormal gait, decreased activity tolerance, decreased balance, decreased endurance, decreased knowledge of condition, decreased knowledge of use of DME, decreased mobility, difficulty walking, decreased ROM, decreased strength, increased edema, postural dysfunction, and pain.   ACTIVITY LIMITATIONS: carrying, lifting, bending, standing, squatting, sleeping, stairs, transfers, locomotion level, and climbing on farm equipment  PARTICIPATION LIMITATIONS: meal prep, cleaning, laundry, driving, community activity, occupation, yard work, and travel  PERSONAL FACTORS: Fitness, Past/current experiences, Time since onset of injury/illness/exacerbation, and 3+ comorbidities: see PMH are also affecting patient's functional outcome.   REHAB POTENTIAL: Good  CLINICAL DECISION MAKING: Stable/uncomplicated  EVALUATION COMPLEXITY: Low   GOALS: Goals reviewed with patient? Yes  SHORT TERM GOALS: (target date for Short term goals 07/06/2023)   1.  Patient will demonstrate independent use of home exercise program to maintain progress from in clinic treatments. Baseline: See objective data Goal status: Met 06/30/2023  2. PROM 0* ext to 110* flexion Baseline: See objective data Goal status:  MET  07/13/2023  3. Interim PSFS average >4/10 Baseline: See objective data Goal status: improved but not to target 07/10/2023  LONG TERM GOALS: (target dates for all long term goals 08/02/2023   )   1. Patient will demonstrate/report pain at worst less than or equal to 2/10 to facilitate minimal limitation in daily activity secondary to pain symptoms. Baseline: See objective data Goal status: primarily MET  07/27/2023   2. Patient will demonstrate independent use of home exercise program to facilitate ability to maintain/progress functional gains from skilled physical therapy services. Baseline: See objective data Goal status: MET 07/27/2023  3.  Patient reports Patient-Specific Activity Score improved  the average by 5 to indicate improvement in functional activities.  Baseline: SEE OBJECTIVE DATA Goal status: MET  07/27/2023   4.  Patient will demonstrate LLE MMT 5/5 throughout to faciltiate usual transfers, stairs, squatting at [facility] for daily life.  Baseline: See objective data Goal status: MET  07/27/2023   5.  Patient left knee AROM 0* ext to 110* flexion.  Baseline: See objective data Goal status: MET 07/27/2023   6. Left knee PROM 0* to 120* Baseline: See objective data Goal status: MET  07/27/2023   7.  patient ambulates >500', neg ramps, curbs and stairs 1 rail independently.  Baseline: See objective data Goal status: MET  07/27/2023  PLAN:  PT FREQUENCY:  2x/week  PT DURATION: 8 weeks  PLANNED INTERVENTIONS: 97164- PT Re-evaluation, 97110-Therapeutic exercises, 97530- Therapeutic activity, 97112- Neuromuscular re-education, 97535- Self Care, 46962- Manual therapy, [phone]- Gait training, [phone]- Aquatic Therapy, [phone]- Electrical stimulation (unattended), [phone]- Electrical stimulation (manual), 97016- Vasopneumatic device, Patient/Family education, Balance training, Stair training, Taping, Dry Needling, Joint mobilization, Scar mobilization, Vestibular training, DME instructions, Cryotherapy, Moist heat, and physical performance testing.   PLAN FOR NEXT SESSION:  discharge PT  Lorie Rook, PT, DPT 07/27/2023, 2:26 PM

## 2023-08-08 ENCOUNTER — Ambulatory Visit (INDEPENDENT_AMBULATORY_CARE_PROVIDER_SITE_OTHER): Admitting: Physician Assistant

## 2023-08-08 ENCOUNTER — Encounter: Payer: Self-pay | Admitting: Physician Assistant

## 2023-08-08 DIAGNOSIS — Z96652 Presence of left artificial knee joint: Secondary | ICD-10-CM

## 2023-08-08 NOTE — Progress Notes (Signed)
 Post-Op Visit Note   Patient: Jeffrey [name]           Date of Birth: [birth year]           MRN: [id number] Visit Date: 08/08/2023 PCP: Beecher Bower, MD   Assessment & Plan:  Chief Complaint:  Chief Complaint  Patient presents with   Left Knee - Follow-up    Left total knee arthroplasty 05/12/2023   Visit Diagnoses:  1. Status post total left knee replacement     Plan: Patient is a pleasant 68 year old gentleman who comes in today 3 months status post left total knee replacement 05/12/2023.  He has been doing well.  He notes occasional soreness which is relieved with Tylenol .  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  Examination of the left knee reveals a moderate effusion.  Range of motion 0 to 110 degrees.  He is stable to valgus varus stress.  Calf is soft nontender.  He is neurovascularly intact distally.  This point, he will continue to work on range of motion strengthening exercises.  He will back off activity and ice and elevate if he notices too much pain or swelling.  Follow-up in 3 months for repeat evaluation and 2 view x-rays of the left knee.  Call with concerns or questions.  Follow-Up Instructions: Return in about 3 months (around 11/08/2023).   Orders:  No orders of the defined types were placed in this encounter.  No orders of the defined types were placed in this encounter.   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 Fib   Hyperlipidemia    Intracerebral hemorrhage (HCC) 1960   Peripheral neuropathy    Post-traumatic osteoarthritis of left knee     Family History  Problem Relation Age of Onset   Vision loss Mother    Diabetes Mother    Crohn's disease Father     Past Surgical History:  Procedure Laterality Date   APPENDECTOMY  1960   I & D EXTREMITY Right 06/19/2020   Procedure: IRRIGATION AND DEBRIDEMENT LONG FINGER;  Surgeon: Brunilda Capra, MD;  Location: [facility] OR;  Service: Orthopedics;  Laterality: Right;   LEFT HEART CATH  06/20/2018   ORIF FEMUR FRACTURE  1988   TOTAL KNEE ARTHROPLASTY Left 05/12/2023   Procedure: LEFT TOTAL KNEE ARTHROPLASTY;  Surgeon: Wes Hamman, MD;  Location: [facility] OR;  Service: Orthopedics;  Laterality: Left;   VARICOSE VEIN SURGERY Right    right leg   Social History   Occupational History    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

## 2023-10-02 ENCOUNTER — Other Ambulatory Visit: Payer: Self-pay

## 2023-10-02 ENCOUNTER — Encounter (HOSPITAL_BASED_OUTPATIENT_CLINIC_OR_DEPARTMENT_OTHER): Payer: Self-pay | Admitting: Physician Assistant

## 2023-10-02 ENCOUNTER — Other Ambulatory Visit (HOSPITAL_BASED_OUTPATIENT_CLINIC_OR_DEPARTMENT_OTHER): Payer: Self-pay | Admitting: Physician Assistant

## 2023-10-02 ENCOUNTER — Emergency Department (HOSPITAL_COMMUNITY)

## 2023-10-02 ENCOUNTER — Inpatient Hospital Stay (HOSPITAL_COMMUNITY)
Admission: EM | Admit: 2023-10-02 | Discharge: 2023-10-06 | DRG: 481 | Disposition: A | Discharge status: Home Health | Attending: Family Medicine | Admitting: Family Medicine

## 2023-10-02 ENCOUNTER — Ambulatory Visit (HOSPITAL_BASED_OUTPATIENT_CLINIC_OR_DEPARTMENT_OTHER): Admitting: Physician Assistant

## 2023-10-02 ENCOUNTER — Encounter (HOSPITAL_COMMUNITY): Payer: Self-pay

## 2023-10-02 ENCOUNTER — Ambulatory Visit (HOSPITAL_BASED_OUTPATIENT_CLINIC_OR_DEPARTMENT_OTHER)

## 2023-10-02 DIAGNOSIS — M898X9 Other specified disorders of bone, unspecified site: Secondary | ICD-10-CM | POA: Diagnosis present

## 2023-10-02 DIAGNOSIS — M1732 Unilateral post-traumatic osteoarthritis, left knee: Secondary | ICD-10-CM

## 2023-10-02 DIAGNOSIS — S72402D Unspecified fracture of lower end of left femur, subsequent encounter for closed fracture with routine healing: Secondary | ICD-10-CM | POA: Diagnosis not present

## 2023-10-02 DIAGNOSIS — Z7901 Long term (current) use of anticoagulants: Secondary | ICD-10-CM

## 2023-10-02 DIAGNOSIS — Z96652 Presence of left artificial knee joint: Secondary | ICD-10-CM | POA: Diagnosis present

## 2023-10-02 DIAGNOSIS — Z821 Family history of blindness and visual loss: Secondary | ICD-10-CM | POA: Diagnosis not present

## 2023-10-02 DIAGNOSIS — Z79899 Other long term (current) drug therapy: Secondary | ICD-10-CM

## 2023-10-02 DIAGNOSIS — Z87891 Personal history of nicotine dependence: Secondary | ICD-10-CM

## 2023-10-02 DIAGNOSIS — S72402K Unspecified fracture of lower end of left femur, subsequent encounter for closed fracture with nonunion: Secondary | ICD-10-CM | POA: Diagnosis not present

## 2023-10-02 DIAGNOSIS — Z833 Family history of diabetes mellitus: Secondary | ICD-10-CM

## 2023-10-02 DIAGNOSIS — S7292XA Unspecified fracture of left femur, initial encounter for closed fracture: Secondary | ICD-10-CM | POA: Diagnosis present

## 2023-10-02 DIAGNOSIS — I480 Paroxysmal atrial fibrillation: Secondary | ICD-10-CM | POA: Diagnosis present

## 2023-10-02 DIAGNOSIS — I6529 Occlusion and stenosis of unspecified carotid artery: Secondary | ICD-10-CM | POA: Diagnosis present

## 2023-10-02 DIAGNOSIS — E785 Hyperlipidemia, unspecified: Secondary | ICD-10-CM | POA: Diagnosis present

## 2023-10-02 DIAGNOSIS — E1142 Type 2 diabetes mellitus with diabetic polyneuropathy: Secondary | ICD-10-CM | POA: Diagnosis present

## 2023-10-02 DIAGNOSIS — W19XXXA Unspecified fall, initial encounter: Secondary | ICD-10-CM | POA: Diagnosis present

## 2023-10-02 DIAGNOSIS — Z751 Person awaiting admission to adequate facility elsewhere: Secondary | ICD-10-CM

## 2023-10-02 DIAGNOSIS — M21372 Foot drop, left foot: Secondary | ICD-10-CM | POA: Diagnosis present

## 2023-10-02 DIAGNOSIS — I4891 Unspecified atrial fibrillation: Secondary | ICD-10-CM | POA: Diagnosis not present

## 2023-10-02 DIAGNOSIS — E1151 Type 2 diabetes mellitus with diabetic peripheral angiopathy without gangrene: Secondary | ICD-10-CM | POA: Diagnosis present

## 2023-10-02 DIAGNOSIS — M9712XA Periprosthetic fracture around internal prosthetic left knee joint, initial encounter: Secondary | ICD-10-CM | POA: Diagnosis present

## 2023-10-02 DIAGNOSIS — D62 Acute posthemorrhagic anemia: Secondary | ICD-10-CM | POA: Diagnosis not present

## 2023-10-02 DIAGNOSIS — S72352A Displaced comminuted fracture of shaft of left femur, initial encounter for closed fracture: Secondary | ICD-10-CM

## 2023-10-02 DIAGNOSIS — D72829 Elevated white blood cell count, unspecified: Secondary | ICD-10-CM | POA: Diagnosis present

## 2023-10-02 DIAGNOSIS — Z7984 Long term (current) use of oral hypoglycemic drugs: Secondary | ICD-10-CM

## 2023-10-02 DIAGNOSIS — Z8673 Personal history of transient ischemic attack (TIA), and cerebral infarction without residual deficits: Secondary | ICD-10-CM

## 2023-10-02 DIAGNOSIS — Z8781 Personal history of (healed) traumatic fracture: Secondary | ICD-10-CM | POA: Diagnosis not present

## 2023-10-02 DIAGNOSIS — S72452A Displaced supracondylar fracture without intracondylar extension of lower end of left femur, initial encounter for closed fracture: Secondary | ICD-10-CM | POA: Insufficient documentation

## 2023-10-02 DIAGNOSIS — E876 Hypokalemia: Secondary | ICD-10-CM

## 2023-10-02 DIAGNOSIS — I2510 Atherosclerotic heart disease of native coronary artery without angina pectoris: Secondary | ICD-10-CM | POA: Diagnosis present

## 2023-10-02 DIAGNOSIS — Z91013 Allergy to seafood: Secondary | ICD-10-CM

## 2023-10-02 LAB — CBC
HCT: 41.3 % (ref 39.0–52.0)
Hemoglobin: 14.2 g/dL (ref 13.0–17.0)
MCH: 31.3 pg (ref 26.0–34.0)
MCHC: 34.4 g/dL (ref 30.0–36.0)
MCV: 91 fL (ref 80.0–100.0)
Platelets: 206 K/uL (ref 150–400)
RBC: 4.54 MIL/uL (ref 4.22–5.81)
RDW: 14.2 % (ref 11.5–15.5)
WBC: 13.7 K/uL — ABNORMAL HIGH (ref 4.0–10.5)
nRBC: 0 % (ref 0.0–0.2)

## 2023-10-02 LAB — BASIC METABOLIC PANEL WITH GFR
Anion gap: 11 (ref 5–15)
BUN: 21 mg/dL (ref 8–23)
CO2: 22 mmol/L (ref 22–32)
Calcium: 8.8 mg/dL — ABNORMAL LOW (ref 8.9–10.3)
Chloride: 102 mmol/L (ref 98–111)
Creatinine, Ser: 1.08 mg/dL (ref 0.61–1.24)
GFR, Estimated: >60 mL/min (ref >60)
Glucose, Bld: 215 mg/dL — ABNORMAL HIGH (ref 70–99)
Potassium: 3 mmol/L — ABNORMAL LOW (ref 3.5–5.1)
Sodium: 135 mmol/L (ref 135–145)

## 2023-10-02 MED ORDER — ONDANSETRON HCL 4 MG/2ML IJ SOLN: 4.0000 mg | Freq: Four times a day (QID) | INTRAMUSCULAR | Status: DC | PRN

## 2023-10-02 MED ORDER — HYDROMORPHONE HCL 1 MG/ML IJ SOLN
1.0000 mg | INTRAMUSCULAR | Status: DC | PRN
  Administered 2023-10-02 – 2023-10-06 (×11): 1 mg via INTRAVENOUS
  Filled 2023-10-02 (×11): qty 1

## 2023-10-02 MED ORDER — ATORVASTATIN CALCIUM 80 MG PO TABS
80.0000 mg | ORAL_TABLET | Freq: Every day | ORAL | Status: DC
  Administered 2023-10-02 – 2023-10-05 (×4): 80 mg via ORAL
  Filled 2023-10-02 (×4): qty 1

## 2023-10-02 MED ORDER — COLCHICINE 0.6 MG PO TABS: 0.6000 mg | ORAL_TABLET | Freq: Every day | ORAL | Status: DC | PRN

## 2023-10-02 MED ORDER — VITAMIN C 500 MG PO TABS
500.0000 mg | ORAL_TABLET | Freq: Every day | ORAL | Status: DC
  Administered 2023-10-04 – 2023-10-06 (×3): 500 mg via ORAL
  Filled 2023-10-02 (×4): qty 1

## 2023-10-02 MED ORDER — DOCUSATE SODIUM 100 MG PO CAPS
100.0000 mg | ORAL_CAPSULE | Freq: Every day | ORAL | Status: DC | PRN
  Administered 2023-10-04 – 2023-10-05 (×2): 100 mg via ORAL
  Filled 2023-10-02 (×2): qty 1

## 2023-10-02 MED ORDER — ZINC SULFATE 220 (50 ZN) MG PO CAPS
220.0000 mg | ORAL_CAPSULE | Freq: Every day | ORAL | Status: DC
  Administered 2023-10-04 – 2023-10-06 (×3): 220 mg via ORAL
  Filled 2023-10-02 (×4): qty 1

## 2023-10-02 MED ORDER — MUPIROCIN 2 % EX OINT
1.0000 | TOPICAL_OINTMENT | Freq: Two times a day (BID) | CUTANEOUS | Status: DC
  Administered 2023-10-02 – 2023-10-05 (×7): 1 via NASAL
  Filled 2023-10-02 (×4): qty 22

## 2023-10-02 MED ORDER — GABAPENTIN 300 MG PO CAPS
600.0000 mg | ORAL_CAPSULE | Freq: Every day | ORAL | Status: DC
  Administered 2023-10-02 – 2023-10-05 (×4): 600 mg via ORAL
  Filled 2023-10-02 (×4): qty 2

## 2023-10-02 MED ORDER — HYDROMORPHONE HCL 1 MG/ML IJ SOLN
1.0000 mg | Freq: Once | INTRAMUSCULAR | Status: AC
  Administered 2023-10-02: 1 mg via INTRAVENOUS
  Filled 2023-10-02: qty 1

## 2023-10-02 MED ORDER — FAMOTIDINE 20 MG PO TABS
20.0000 mg | ORAL_TABLET | Freq: Two times a day (BID) | ORAL | Status: DC
  Administered 2023-10-02: 20 mg via ORAL
  Filled 2023-10-02 (×2): qty 1

## 2023-10-02 MED ORDER — CITALOPRAM HYDROBROMIDE 20 MG PO TABS
10.0000 mg | ORAL_TABLET | Freq: Every morning | ORAL | Status: DC
  Administered 2023-10-03 – 2023-10-06 (×4): 10 mg via ORAL
  Filled 2023-10-02 (×4): qty 1

## 2023-10-02 MED ORDER — NITROGLYCERIN 0.4 MG SL SUBL: 0.4000 mg | SUBLINGUAL_TABLET | SUBLINGUAL | Status: DC | PRN

## 2023-10-02 MED ORDER — METOPROLOL TARTRATE 25 MG PO TABS
25.0000 mg | ORAL_TABLET | Freq: Two times a day (BID) | ORAL | Status: DC
  Administered 2023-10-03 – 2023-10-05 (×5): 25 mg via ORAL
  Filled 2023-10-02 (×8): qty 1

## 2023-10-02 MED ORDER — ONDANSETRON HCL 4 MG PO TABS: 4.0000 mg | ORAL_TABLET | Freq: Three times a day (TID) | ORAL | Status: DC | PRN

## 2023-10-02 MED ORDER — RANOLAZINE ER 500 MG PO TB12
500.0000 mg | ORAL_TABLET | Freq: Two times a day (BID) | ORAL | Status: DC
  Administered 2023-10-02 – 2023-10-06 (×7): 500 mg via ORAL
  Filled 2023-10-02 (×10): qty 1

## 2023-10-02 MED ORDER — FUROSEMIDE 20 MG PO TABS
20.0000 mg | ORAL_TABLET | Freq: Every morning | ORAL | Status: DC
  Administered 2023-10-03: 20 mg via ORAL
  Filled 2023-10-02: qty 1

## 2023-10-02 MED ORDER — KCL-LACTATED RINGERS-D5W 20 MEQ/L IV SOLN
INTRAVENOUS | Status: AC
  Filled 2023-10-02 (×3): qty 1000

## 2023-10-02 MED ORDER — ONDANSETRON HCL 4 MG PO TABS: 4.0000 mg | ORAL_TABLET | Freq: Four times a day (QID) | ORAL | Status: DC | PRN

## 2023-10-02 MED ORDER — POTASSIUM CHLORIDE 10 MEQ/100ML IV SOLN
10.0000 meq | Freq: Once | INTRAVENOUS | Status: AC
  Administered 2023-10-02: 10 meq via INTRAVENOUS

## 2023-10-02 MED ORDER — GABAPENTIN 300 MG PO CAPS
300.0000 mg | ORAL_CAPSULE | Freq: Every day | ORAL | Status: DC
  Administered 2023-10-04 – 2023-10-06 (×3): 300 mg via ORAL
  Filled 2023-10-02 (×4): qty 1

## 2023-10-02 MED ORDER — GABAPENTIN 300 MG PO CAPS: 300.0000 mg | ORAL_CAPSULE | ORAL | Status: DC

## 2023-10-02 MED ORDER — ALLOPURINOL 100 MG PO TABS
100.0000 mg | ORAL_TABLET | Freq: Every day | ORAL | Status: DC
  Administered 2023-10-04 – 2023-10-06 (×3): 100 mg via ORAL
  Filled 2023-10-02 (×4): qty 1

## 2023-10-02 MED ORDER — APIXABAN 5 MG PO TABS
5.0000 mg | ORAL_TABLET | Freq: Two times a day (BID) | ORAL | Status: DC
  Filled 2023-10-02: qty 1

## 2023-10-02 MED ORDER — POTASSIUM CHLORIDE CRYS ER 20 MEQ PO TBCR
40.0000 meq | EXTENDED_RELEASE_TABLET | Freq: Once | ORAL | Status: AC
  Administered 2023-10-02: 40 meq via ORAL
  Filled 2023-10-02: qty 2

## 2023-10-02 MED ORDER — OXYCODONE-ACETAMINOPHEN 5-325 MG PO TABS
1.0000 | ORAL_TABLET | Freq: Two times a day (BID) | ORAL | Status: DC | PRN
  Administered 2023-10-03: 2 via ORAL
  Filled 2023-10-02: qty 2

## 2023-10-02 MED ORDER — METHOCARBAMOL 750 MG PO TABS
750.0000 mg | ORAL_TABLET | Freq: Three times a day (TID) | ORAL | Status: DC | PRN
  Administered 2023-10-03 – 2023-10-05 (×6): 750 mg via ORAL
  Filled 2023-10-02 (×7): qty 1

## 2023-10-02 NOTE — Progress Notes (Signed)
 Orthopedic Tech Progress Note Patient Details:  Jeffrey [name] Aug 03, 1955 [id number]  Ortho Devices Type of Ortho Device: Knee Immobilizer Ortho Device/Splint Location: lle Ortho Device/Splint Interventions: Ordered, Adjustment, Application   Post Interventions Patient Tolerated: Well Instructions Provided: Care of device, Adjustment of device  [name] PARAS 10/02/2023, 9:20 PM

## 2023-10-02 NOTE — H&P (Signed)
 History and Physical    Patient: Jeffrey [name] FMW:[id number] DOB: 28-Jul-1955 DOA: 10/02/2023 DOS: the patient was seen and examined on 10/02/2023 PCP: [facility]  Patient coming from: Home  Chief Complaint:  Chief Complaint  Patient presents with   Leg Injury   HPI: XUE LOW is a 68 y.o. male with medical history significant of atrial fibrillation on chronic anticoagulation, diabetes, hyperlipidemia, history of intracerebral hemorrhage, history of peripheral neuropathy, history of previous traumatic fracture of the distal left femur, carotid artery disease, coronary artery disease who presents to the ER after being sent by orthopedics Dr. Jerri of [facility].  His PA ordered an x-ray of his left knee showing periprosthetic left femur fracture.  Couple of days ago he apparently had Hyperflex left knee which has been repaired twice before.  He felt immediate pain which is why he went to the orthopedics office.  He did not have a fall at the time.  It was purely mechanical.  Patient came to the ER where this was confirmed.  Orthopedics 1 trauma orthopedics to see patient in the morning and plan surgery.  Patient is on Eliquis  which needs to be washed out.  Is being admitted to the medical service for further evaluation and treatment.  Review of Systems: As mentioned in the history of present illness. All other systems reviewed and are negative. Past Medical History:  Diagnosis Date   Aneurysm of infrarenal abdominal aorta (HCC)    CAD (coronary artery disease)    4 stents   Carotid artery disease (HCC)    Left ICA   Diabetes mellitus without complication (HCC)    Dysrhythmia    A. Fib   Hyperlipidemia    Intracerebral hemorrhage (HCC) 1960   Peripheral neuropathy    Post-traumatic osteoarthritis of left knee    Past Surgical History:  Procedure Laterality Date   APPENDECTOMY  1960   I & D EXTREMITY Right 06/19/2020   Procedure: IRRIGATION AND DEBRIDEMENT LONG  FINGER;  Surgeon: Murrell Drivers, MD;  Location: [facility] OR;  Service: Orthopedics;  Laterality: Right;   LEFT HEART CATH  06/20/2018   ORIF FEMUR FRACTURE  1988   TOTAL KNEE ARTHROPLASTY Left 05/12/2023   Procedure: LEFT TOTAL KNEE ARTHROPLASTY;  Surgeon: Jerri Kay CHRISTELLA, MD;  Location: [facility] OR;  Service: Orthopedics;  Laterality: Left;   VARICOSE VEIN SURGERY Right    right leg   Social History:  reports that he quit smoking about 10 years ago. His smoking use included cigarettes. He has never used smokeless tobacco. He reports that he does not drink alcohol and does not use drugs.  Allergies  Allergen Reactions   Shellfish-Derived Products Other (See Comments)    GI upset- this tears up my stomach    Family History  Problem Relation Age of Onset   Vision loss Mother    Diabetes Mother    Crohn's disease Father     Prior to Admission medications   Medication Sig Start Date End Date Taking? Authorizing Provider  allopurinol  (ZYLOPRIM ) 100 MG tablet Take 100 mg by mouth daily. 01/22/21  Yes [provider]  apixaban  (ELIQUIS ) 5 MG TABS tablet Take 5 mg by mouth 2 (two) times daily.   Yes [provider]  ascorbic acid  (VITAMIN C ) 500 MG tablet Take 500 mg by mouth daily.   Yes [provider]  atorvastatin  (LIPITOR ) 80 MG tablet Take 80 mg by mouth at bedtime. 02/24/17  Yes [provider]  citalopram  (CELEXA ) 20 MG tablet Take 10 mg by mouth in the morning. 06/17/20  Yes [provider]  colchicine  0.6 MG tablet Take 0.6 mg by mouth daily as needed (gout flares).   Yes [provider]  empagliflozin  (JARDIANCE ) 25 MG TABS tablet Take 12.5 mg by mouth in the morning.   Yes [provider]  famotidine  (PEPCID ) 20 MG tablet Take 20 mg by mouth 2 (two) times daily.   Yes [provider]  furosemide  (LASIX ) 20 MG tablet Take 20 mg by mouth in the morning. 11/26/18  Yes [provider]  gabapentin  (NEURONTIN ) 300 MG  capsule Take 300-600 mg by mouth See admin instructions. Take 1 capsule (300 mg) by mouth once daily in the morning & take 2 capsules (600 mg) by mouth at bedtime. 09/17/20  Yes [provider]  HYDROcodone -acetaminophen  (NORCO/VICODIN) 5-325 MG tablet Take 1 tablet by mouth 2 (two) times daily as needed for moderate pain (pain score 4-6). 07/13/23  Yes Jule Ronal CROME, PA-C  methocarbamol  (ROBAXIN -750) 750 MG tablet Take 1 tablet (750 mg total) by mouth 3 (three) times daily as needed for muscle spasms. 05/26/23  Yes Jule Ronal CROME, PA-C  metoprolol  tartrate (LOPRESSOR ) 50 MG tablet Take 25 mg by mouth 2 (two) times daily. 11/16/14  Yes [provider]  Multiple Vitamins-Minerals (PRESERVISION AREDS 2) CAPS Chew 1 capsule by mouth in the morning and at bedtime.   Yes [provider]  ondansetron  (ZOFRAN ) 4 MG tablet Take 1 tablet (4 mg total) by mouth every 8 (eight) hours as needed for nausea or vomiting. 05/03/23  Yes Jule Ronal CROME, PA-C  oxyCODONE -acetaminophen  (PERCOCET) 5-325 MG tablet Take 1-2 tablets by mouth every 12 (twelve) hours as needed. 06/16/23  Yes Magnant, Charles L, PA-C  ranolazine  (RANEXA ) 500 MG 12 hr tablet Take 500 mg by mouth 2 (two) times daily. 07/26/16  Yes [provider]  zinc  sulfate, 50mg  elemental zinc , 220 (50 Zn) MG capsule Take 220 mg by mouth daily.   Yes [provider]  calcium  carbonate (OSCAL) 1500 (600 Ca) MG TABS tablet Take 1 tablet (1,500 mg total) by mouth daily. 04/27/22 05/27/22  Jerri Kay HERO, MD  docusate sodium  (COLACE) 100 MG capsule Take 1 capsule (100 mg total) by mouth daily as needed. Patient not taking: Reported on 10/02/2023 05/03/23 05/02/24  Jule Ronal CROME, PA-C  nitroGLYCERIN  (NITROSTAT ) 0.4 MG SL tablet Place 0.4 mg under the tongue every 5 (five) minutes x 3 doses as needed for chest pain.    [provider]    Physical Exam: Vitals:   10/02/23 1615 10/02/23 1652 10/02/23 1954 10/02/23  2018  BP: 139/81  132/71   Pulse: 65  (!) 53   Resp: 18     Temp: 97.8 F (36.6 C)  97.8 F (36.6 C)   TempSrc:   Oral   SpO2: 99%  98%   Weight:  103 kg  98.4 kg  Height:  6' 1 (1.854 m)  6' 1 (1.854 m)   Constitutional: Obese, NAD, calm, comfortable Eyes: PERRL, lids and conjunctivae normal ENMT: Mucous membranes are moist. Posterior pharynx clear of any exudate or lesions.Normal dentition.  Neck: normal, supple, no masses, no thyromegaly Respiratory: clear to auscultation bilaterally, no wheezing, no crackles. Normal respiratory effort. No accessory muscle use.  Cardiovascular: Regular rate and rhythm, no murmurs / rubs / gallops. No extremity edema. 2+ pedal pulses. No carotid bruits.  Abdomen: no tenderness, no masses palpated. No  hepatosplenomegaly. Bowel sounds positive.  Musculoskeletal: Patient's knee is currently immobilized, swollen and tender with ambulation Skin: no rashes, lesions, ulcers. No induration Neurologic: CN 2-12 grossly intact. Sensation intact, DTR normal. Strength 5/5 in all 4.  Psychiatric: Normal judgment and insight. Alert and oriented x 3. Normal mood  Data Reviewed:  Potassium 3.0, heart rate is 53, calcium  8.8, glucose 250. X-ray of the left knee showed distal femoral metaphyseal fracture.  X-ray of the pelvis showed previous internal fixation of the left hip degenerative changes in the wall of the spine and hips EKG showed normal sinus rhythm.  White count 13.7 and glucose 215.  Assessment and Plan:  #1 distal femur periprosthetic fracture: Patient will be admitted.  Pain control.  Hold anticoagulation.  Immobilized the left knee.  Trauma orthopedics to see patient and decide on surgery.  More than likely patient will need washout of his anticoagulation.  #2 atrial fibrillation: Rate is controlled.  Continue home regimen.  No anticoagulation.  #3 hypokalemia: Will continue to replete.  #4 carotid artery stenosis: Stable.  Continue home  regimen.  #5 leukocytosis: Probably secondary to inflammation.  Continue to monitor  #6 diabetes type 2: Continue sliding scale insulin   #7 hypercalcemia: Calcium  8.8.  Continue to monitor    Advance Care Planning:   Code Status: Prior full code  Consults: Dr. Jerri, orthopedics  Family Communication: No family at bedside  Severity of Illness: The appropriate patient status for this patient is INPATIENT. Inpatient status is judged to be reasonable and necessary in order to provide the required intensity of service to ensure the patient's safety. The patient's presenting symptoms, physical exam findings, and initial radiographic and laboratory data in the context of their chronic comorbidities is felt to place them at high risk for further clinical deterioration. Furthermore, it is not anticipated that the patient will be medically stable for discharge from the hospital within 2 midnights of admission.   * I certify that at the point of admission it is my clinical judgment that the patient will require inpatient hospital care spanning beyond 2 midnights from the point of admission due to high intensity of service, high risk for further deterioration and high frequency of surveillance required.*  AuthorBETHA SIM KNOLL, MD 10/02/2023 9:20 PM  For on call review [email or web address].

## 2023-10-02 NOTE — ED Notes (Signed)
NPO @ Midnight.

## 2023-10-02 NOTE — ED Triage Notes (Signed)
 Pt arrives with left leg injury that happened 2 days ago. Pt had outpt imaging and has a confirmed femur fx. Pt unable to put weight on leg. Pt has had multiple surgeries on that leg.

## 2023-10-02 NOTE — ED Provider Notes (Signed)
 [facility] EMERGENCY DEPARTMENT AT [facility] Provider Note   CSN: [id number] Arrival date & time: 10/02/23  1612     Patient presents with: Leg Injury   Jeffrey [name] is a 68 y.o. male on eliquis  for A Fib here with fall 2 days ago, hyperflexed left knee, couldn't walk after.  Went to ortho office today (Dr[facility] Benjiman [facility]), seen by PA and had xray concerning for periprosthetic left femur fracture.  Referred to ED.  Reports chronic neuropathy in feet but no change in sensation.  Denies striking head with fall or any other injuries.  {Add pertinent medical, surgical, social history, OB history to HPI:32947} HPI     Prior to Admission medications   Medication Sig Start Date End Date Taking? Authorizing Provider  allopurinol  (ZYLOPRIM ) 100 MG tablet Take 100 mg by mouth daily. 01/22/21   [provider]  apixaban  (ELIQUIS ) 5 MG TABS tablet Take 5 mg by mouth 2 (two) times daily.    [provider]  ascorbic acid  (VITAMIN C ) 500 MG tablet Take 500 mg by mouth daily.    [provider]  atorvastatin  (LIPITOR ) 80 MG tablet Take 80 mg by mouth at bedtime. 02/24/17   [provider]  calcium  carbonate (OSCAL) 1500 (600 Ca) MG TABS tablet Take 1 tablet (1,500 mg total) by mouth daily. 04/27/22 05/27/22  Jerri Kay HERO, MD  citalopram  (CELEXA ) 20 MG tablet Take 10 mg by mouth in the morning. 06/17/20   [provider]  colchicine  0.6 MG tablet Take 0.6 mg by mouth daily as needed (gout flares).    [provider]  docusate sodium  (COLACE) 100 MG capsule Take 1 capsule (100 mg total) by mouth daily as needed. 05/03/23 05/02/24  Jule Ronal CROME, PA-C  empagliflozin  (JARDIANCE ) 25 MG TABS tablet Take 12.5 mg by mouth in the morning.    [provider]  famotidine  (PEPCID ) 20 MG tablet Take 20 mg by mouth 2 (two) times daily.    [provider]  furosemide  (LASIX ) 20 MG tablet Take 20 mg by mouth in the morning.  11/26/18   [provider]  gabapentin  (NEURONTIN ) 300 MG capsule Take 300-600 mg by mouth See admin instructions. Take 1 capsule (300 mg) by mouth once daily in the morning & take 2 capsules (600 mg) by mouth at bedtime. 09/17/20   [provider]  HYDROcodone -acetaminophen  (NORCO/VICODIN) 5-325 MG tablet Take 1 tablet by mouth 2 (two) times daily as needed for moderate pain (pain score 4-6). 07/13/23   Jule Ronal CROME, PA-C  hydrOXYzine (VISTARIL) 25 MG capsule Take by mouth. 03/16/23   [provider]  methocarbamol  (ROBAXIN -750) 750 MG tablet Take 1 tablet (750 mg total) by mouth 3 (three) times daily as needed for muscle spasms. 05/26/23   Jule Ronal CROME, PA-C  metoprolol  tartrate (LOPRESSOR ) 50 MG tablet Take 25 mg by mouth 2 (two) times daily. 11/16/14   [provider]  Multiple Vitamins-Minerals (PRESERVISION AREDS 2) CAPS Chew 1 capsule by mouth in the morning and at bedtime.    [provider]  nitroGLYCERIN  (NITROSTAT ) 0.4 MG SL tablet Place 0.4 mg under the tongue every 5 (five) minutes x 3 doses as needed for chest pain.    [provider]  ondansetron  (ZOFRAN ) 4 MG tablet Take 1 tablet (4 mg total) by mouth every 8 (eight) hours as needed for nausea or vomiting. 05/03/23   Jule Ronal CROME, PA-C  oxyCODONE -acetaminophen  (PERCOCET) 5-325 MG tablet  Take 1-2 tablets by mouth every 12 (twelve) hours as needed. 06/16/23   Magnant, Carlin CROME, PA-C  ranolazine  (RANEXA ) 500 MG 12 hr tablet Take 500 mg by mouth 2 (two) times daily. 07/26/16   [provider]  zinc  sulfate, 50mg  elemental zinc , 220 (50 Zn) MG capsule Take 220 mg by mouth daily.    [provider]    Allergies: Shellfish-derived products    Review of Systems  Updated Vital Signs BP 139/81   Pulse 65   Temp 97.8 F (36.6 C)   Resp 18   Ht 6' 1 (1.854 m)   Wt 103 kg   SpO2 99%   BMI 29.95 kg/m   Physical Exam Constitutional:      General: He is not in  acute distress. HENT:     Head: Normocephalic and atraumatic.  Eyes:     Conjunctiva/sclera: Conjunctivae normal.     Pupils: Pupils are equal, round, and reactive to light.  Cardiovascular:     Rate and Rhythm: Normal rate and regular rhythm.     Pulses: Normal pulses.  Pulmonary:     Effort: Pulmonary effort is normal. No respiratory distress.  Abdominal:     General: There is no distension.     Tenderness: There is no abdominal tenderness.  Musculoskeletal:     Comments: Swelling of the left knee, pt unable to tolerate any ROM testing, compartments of the legs are soft  Skin:    General: Skin is warm and dry.  Neurological:     General: No focal deficit present.     Mental Status: He is alert and oriented to person, place, and time. Mental status is at baseline.  Psychiatric:        Mood and Affect: Mood normal.        Behavior: Behavior normal.     (all labs ordered are listed, but only abnormal results are displayed) Labs Reviewed - No data to display  EKG: None  Radiology: No results found.  {Document cardiac monitor, telemetry assessment procedure when appropriate:32947} Procedures   Medications Ordered in the ED - No data to display    {Click here for ABCD2, HEART and other calculators REFRESH Note before signing:1}                              Medical Decision Making Amount and/or Complexity of Data Reviewed Labs: ordered. Radiology: ordered.  Risk Prescription drug management.   Patient here with isolated injury to left lower leg after mechanical fall 2 days ago  No head injuries - no indication for CT head imaging  I reviewed external records office note from orthopedics today - we'll obtain xrays of femur and pelvis here to complete films, and likely admit to hospitalist service for ortho evaluation.   I spoke to orthopedic surgeon ***   I reviewed the patient's labs personally, notable for ***  IV pain meds given in ED  Supplemental  history provided by patient's family at bedside  He is neurovascularly intact on exam.  No evidence of ischemia of the lower limbs, compartment syndrome, or open fracture.  {Document critical care time when appropriate  Document review of labs and clinical decision tools ie CHADS2VASC2, etc  Document your independent review of radiology images and any outside records  Document your discussion with family members, caretakers and with consultants  Document social determinants of health affecting pt's care  Document your decision  making why or why not admission, treatments were needed:32947:::1}   Final diagnoses:  None    ED Discharge Orders     None

## 2023-10-02 NOTE — Progress Notes (Signed)
 Office Visit Note   Patient: Jeffrey [name]           Date of Birth: Feb 25, 1956           MRN: [id number] Visit Date: 10/02/2023              Requested by: Fernand Tracey LABOR, MD [address] [place],  [place] [address] PCP: Fernand Tracey LABOR, MD  Chief Complaint  Patient presents with   Left Knee - Pain      HPI: Patient is a pleasant 68 year old gentleman who is a patient of Dr. Jerri.  He is status post lefttotal knee arthroplasty 4 to 5 months ago.  He had been doing well.  Unfortunately had a hyperflexion of his knee with a fall 2 days ago.  He has had significant pain since cannot put weight on it and is swollen.  He is taken Tylenol  with no relief.  He has had a supracondylar fracture of this knee in the past..  He is currently on Eliquis .  Has a history of diabetes  Assessment & Plan: Visit Diagnoses:  1. Status post total left knee replacement   2. Periprosthetic fracture around internal prosthetic left knee joint, initial encounter   3. Closed supracondylar fracture of left femur, initial encounter [facility])     Plan: 68 year old gentleman who is status post left total knee arthroplasty and previous fixation of the supracondylar fracture of a left femur as well as a femur fracture.  He was doing well with his knee replacement.  Unfortunately over the weekend he had a flexion injury onto his left knee.  He was unable to bear weight afterwards.  X-rays today demonstrate recurrent fracture supracondylar fracture periprosthetic fracture of the left knee.  I did speak with Dr. Jerri by phone.  He was familiar with the patient.  He asked that I referred him to the emergency room.  Admission to internal medicine he would like a trauma consult given the complexity of this fracture around his knee replacement and previous surgeries  Follow-Up Instructions: Refer to hospital for admission  Ortho Exam  Patient is alert, oriented, no adenopathy, well-dressed, normal affect, normal respiratory  effort. Examination he has a moderate effusion in his left knee cannot straighten the knee or fire the quadriceps complex.  Compartments are soft and nontender he is neurovascularly intact.  He is nonweightbearing    Imaging: No results found. No images are attached to the encounter.  Labs: Lab Results  Component Value Date   HGBA1C 6.1 (H) 05/12/2023   REPTSTATUS 06/25/2020 FINAL 06/19/2020   GRAMSTAIN  06/19/2020    RARE WBC PRESENT, PREDOMINANTLY MONONUCLEAR NO ORGANISMS SEEN    CULT  06/19/2020    No growth aerobically or anaerobically. Performed at [facility], [address] [place], [place] [address]      No results found for: ALBUMIN, PREALBUMIN, CBC  Lab Results  Component Value Date   MG 1.8 11/27/2021   MG 1.8 11/26/2021   MG 1.6 (L) 08/20/2021   No results found for: VD25OH  No results found for: PREALBUMIN    Latest Ref Rng & Units 05/05/2023   11:50 AM 11/27/2021    6:56 AM 11/26/2021    2:54 PM  CBC EXTENDED  WBC 4.0 - 10.5 K/uL 7.9  6.8  7.4   RBC 4.22 - 5.81 MIL/uL 4.23  3.50  3.90   Hemoglobin 13.0 - 17.0 g/dL 86.2  88.5  87.5   HCT  39.0 - 52.0 % 39.2  32.8  36.8   Platelets 150 - 400 K/uL 181  194  213      There is no height or weight on file to calculate BMI.  Orders:  No orders of the defined types were placed in this encounter.  No orders of the defined types were placed in this encounter.    Procedures: No procedures performed  Clinical Data: No additional findings.  ROS:  All other systems negative, except as noted in the HPI. Review of Systems  Objective: Vital Signs: There were no vitals taken for this visit.  Specialty Comments:  No specialty comments available.  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 Fib   Hyperlipidemia    Intracerebral hemorrhage (HCC) 1960   Peripheral neuropathy    Post-traumatic osteoarthritis of left knee     Family History  Problem Relation Age of Onset   Vision loss Mother    Diabetes Mother    Crohn's disease Father     Past Surgical History:  Procedure Laterality Date   APPENDECTOMY  1960   I & D EXTREMITY Right 06/19/2020   Procedure: IRRIGATION AND DEBRIDEMENT LONG FINGER;  Surgeon: Murrell Drivers, MD;  Location: [facility] OR;  Service: Orthopedics;  Laterality: Right;   LEFT HEART CATH  06/20/2018   ORIF FEMUR FRACTURE  1988   TOTAL KNEE ARTHROPLASTY Left 05/12/2023   Procedure: LEFT TOTAL KNEE ARTHROPLASTY;  Surgeon: Jerri Kay HERO, MD;  Location: [facility] OR;  Service: Orthopedics;  Laterality: Left;   VARICOSE VEIN SURGERY Right    right leg   Social History   Occupational History    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

## 2023-10-03 ENCOUNTER — Inpatient Hospital Stay (HOSPITAL_COMMUNITY)

## 2023-10-03 ENCOUNTER — Encounter (HOSPITAL_COMMUNITY)
Admission: EM | Disposition: A | Discharge status: Home Health | Payer: Self-pay | Source: Home / Self Care | Attending: Family Medicine

## 2023-10-03 ENCOUNTER — Inpatient Hospital Stay (HOSPITAL_COMMUNITY): Admitting: Certified Registered Nurse Anesthetist

## 2023-10-03 ENCOUNTER — Encounter (HOSPITAL_COMMUNITY): Payer: Self-pay | Admitting: Internal Medicine

## 2023-10-03 ENCOUNTER — Other Ambulatory Visit: Payer: Self-pay

## 2023-10-03 DIAGNOSIS — S72402D Unspecified fracture of lower end of left femur, subsequent encounter for closed fracture with routine healing: Secondary | ICD-10-CM | POA: Diagnosis not present

## 2023-10-03 DIAGNOSIS — Z87891 Personal history of nicotine dependence: Secondary | ICD-10-CM

## 2023-10-03 DIAGNOSIS — I2510 Atherosclerotic heart disease of native coronary artery without angina pectoris: Secondary | ICD-10-CM | POA: Diagnosis not present

## 2023-10-03 DIAGNOSIS — I4891 Unspecified atrial fibrillation: Secondary | ICD-10-CM

## 2023-10-03 DIAGNOSIS — S7292XA Unspecified fracture of left femur, initial encounter for closed fracture: Principal | ICD-10-CM

## 2023-10-03 HISTORY — PX: ORIF FEMUR FRACTURE: SHX2119

## 2023-10-03 LAB — COMPREHENSIVE METABOLIC PANEL WITH GFR
ALT: 22 U/L (ref 0–44)
AST: 18 U/L (ref 15–41)
Albumin: 3.3 g/dL — ABNORMAL LOW (ref 3.5–5.0)
Alkaline Phosphatase: 105 U/L (ref 38–126)
Anion gap: 11 (ref 5–15)
BUN: 18 mg/dL (ref 8–23)
CO2: 23 mmol/L (ref 22–32)
Calcium: 8.8 mg/dL — ABNORMAL LOW (ref 8.9–10.3)
Chloride: 105 mmol/L (ref 98–111)
Creatinine, Ser: 0.86 mg/dL (ref 0.61–1.24)
GFR, Estimated: >60 mL/min (ref >60)
Glucose, Bld: 219 mg/dL — ABNORMAL HIGH (ref 70–99)
Potassium: 3.9 mmol/L (ref 3.5–5.1)
Sodium: 139 mmol/L (ref 135–145)
Total Bilirubin: 1 mg/dL (ref 0.0–1.2)
Total Protein: 6.5 g/dL (ref 6.5–8.1)

## 2023-10-03 LAB — CBC
HCT: 38.2 % — ABNORMAL LOW (ref 39.0–52.0)
Hemoglobin: 13.2 g/dL (ref 13.0–17.0)
MCH: 31.5 pg (ref 26.0–34.0)
MCHC: 34.6 g/dL (ref 30.0–36.0)
MCV: 91.2 fL (ref 80.0–100.0)
Platelets: 188 K/uL (ref 150–400)
RBC: 4.19 MIL/uL — ABNORMAL LOW (ref 4.22–5.81)
RDW: 14.4 % (ref 11.5–15.5)
WBC: 9.6 K/uL (ref 4.0–10.5)
nRBC: 0 % (ref 0.0–0.2)

## 2023-10-03 LAB — HIV ANTIBODY (ROUTINE TESTING W REFLEX): HIV Screen 4th Generation wRfx: NONREACTIVE

## 2023-10-03 LAB — SURGICAL PCR SCREEN
MRSA, PCR: NEGATIVE
Staphylococcus aureus: NEGATIVE

## 2023-10-03 LAB — GLUCOSE, CAPILLARY
Glucose-Capillary: 163 mg/dL — ABNORMAL HIGH (ref 70–99)
Glucose-Capillary: 180 mg/dL — ABNORMAL HIGH (ref 70–99)

## 2023-10-03 SURGERY — OPEN REDUCTION INTERNAL FIXATION (ORIF) DISTAL FEMUR FRACTURE
Anesthesia: General | Laterality: Left

## 2023-10-03 MED ORDER — LIDOCAINE 2% (20 MG/ML) 5 ML SYRINGE
INTRAMUSCULAR | Status: AC
Start: 2023-10-03 — End: 2023-10-03
  Filled 2023-10-03: qty 5

## 2023-10-03 MED ORDER — CEFAZOLIN SODIUM-DEXTROSE 2-4 GM/100ML-% IV SOLN
2.0000 g | Freq: Three times a day (TID) | INTRAVENOUS | Status: AC
  Administered 2023-10-03 – 2023-10-04 (×3): 2 g via INTRAVENOUS
  Filled 2023-10-03 (×3): qty 100

## 2023-10-03 MED ORDER — EPHEDRINE 5 MG/ML INJ
INTRAVENOUS | Status: AC
  Filled 2023-10-03: qty 5

## 2023-10-03 MED ORDER — NEOSTIGMINE METHYLSULFATE 10 MG/10ML IV SOLN
INTRAVENOUS | Status: DC | PRN
  Administered 2023-10-03: 2 mg via INTRAVENOUS

## 2023-10-03 MED ORDER — MIDAZOLAM HCL 2 MG/2ML IJ SOLN
INTRAMUSCULAR | Status: AC
  Filled 2023-10-03: qty 2

## 2023-10-03 MED ORDER — FENTANYL CITRATE (PF) 100 MCG/2ML IJ SOLN
25.0000 ug | INTRAMUSCULAR | Status: DC | PRN
  Administered 2023-10-03 (×2): 50 ug via INTRAVENOUS

## 2023-10-03 MED ORDER — DIPHENHYDRAMINE HCL 25 MG PO CAPS
25.0000 mg | ORAL_CAPSULE | Freq: Four times a day (QID) | ORAL | Status: DC | PRN
  Administered 2023-10-03: 25 mg via ORAL
  Filled 2023-10-03: qty 1

## 2023-10-03 MED ORDER — ONDANSETRON HCL 4 MG/2ML IJ SOLN
INTRAMUSCULAR | Status: AC
Start: 2023-10-03 — End: 2023-10-03
  Filled 2023-10-03: qty 2

## 2023-10-03 MED ORDER — CEFAZOLIN SODIUM-DEXTROSE 2-4 GM/100ML-% IV SOLN
2.0000 g | INTRAVENOUS | Status: AC
  Administered 2023-10-03: 2 g via INTRAVENOUS
  Filled 2023-10-03: qty 100

## 2023-10-03 MED ORDER — DEXAMETHASONE SODIUM PHOSPHATE 10 MG/ML IJ SOLN
INTRAMUSCULAR | Status: AC
  Filled 2023-10-03: qty 2

## 2023-10-03 MED ORDER — ROCURONIUM BROMIDE 10 MG/ML (PF) SYRINGE
PREFILLED_SYRINGE | INTRAVENOUS | Status: AC
Start: 2023-10-03 — End: 2023-10-03
  Filled 2023-10-03: qty 20

## 2023-10-03 MED ORDER — LIDOCAINE 2% (20 MG/ML) 5 ML SYRINGE
INTRAMUSCULAR | Status: DC | PRN
  Administered 2023-10-03: 60 mg via INTRAVENOUS

## 2023-10-03 MED ORDER — OXYCODONE HCL 5 MG PO TABS: 5.0000 mg | ORAL_TABLET | Freq: Once | ORAL | Status: DC | PRN

## 2023-10-03 MED ORDER — FENTANYL CITRATE (PF) 250 MCG/5ML IJ SOLN
INTRAMUSCULAR | Status: AC
  Filled 2023-10-03: qty 5

## 2023-10-03 MED ORDER — ROCURONIUM BROMIDE 10 MG/ML (PF) SYRINGE
PREFILLED_SYRINGE | INTRAVENOUS | Status: DC | PRN
  Administered 2023-10-03: 10 mg via INTRAVENOUS
  Administered 2023-10-03: 50 mg via INTRAVENOUS

## 2023-10-03 MED ORDER — ACETAMINOPHEN 500 MG PO TABS
1000.0000 mg | ORAL_TABLET | Freq: Once | ORAL | Status: AC
  Administered 2023-10-03: 1000 mg via ORAL
  Filled 2023-10-03: qty 2

## 2023-10-03 MED ORDER — ACETAMINOPHEN 325 MG PO TABS: 650.0000 mg | ORAL_TABLET | Freq: Three times a day (TID) | ORAL | Status: DC | PRN

## 2023-10-03 MED ORDER — FENTANYL CITRATE (PF) 250 MCG/5ML IJ SOLN
INTRAMUSCULAR | Status: DC | PRN
  Administered 2023-10-03: 50 ug via INTRAVENOUS
  Administered 2023-10-03 (×2): 25 ug via INTRAVENOUS
  Administered 2023-10-03: 50 ug via INTRAVENOUS
  Administered 2023-10-03: 100 ug via INTRAVENOUS

## 2023-10-03 MED ORDER — 0.9 % SODIUM CHLORIDE (POUR BTL) OPTIME
TOPICAL | Status: DC | PRN
Start: 2023-10-03 — End: 2023-10-03
  Administered 2023-10-03: 1000 mL

## 2023-10-03 MED ORDER — DEXAMETHASONE SODIUM PHOSPHATE 10 MG/ML IJ SOLN
INTRAMUSCULAR | Status: AC
  Filled 2023-10-03: qty 1

## 2023-10-03 MED ORDER — OXYCODONE-ACETAMINOPHEN 5-325 MG PO TABS
1.0000 | ORAL_TABLET | Freq: Four times a day (QID) | ORAL | Status: DC | PRN
  Administered 2023-10-03 – 2023-10-06 (×9): 2 via ORAL
  Filled 2023-10-03 (×9): qty 2

## 2023-10-03 MED ORDER — LACTATED RINGERS IV SOLN: INTRAVENOUS | Status: DC

## 2023-10-03 MED ORDER — OXYCODONE HCL 5 MG/5ML PO SOLN: 5.0000 mg | Freq: Once | ORAL | Status: DC | PRN

## 2023-10-03 MED ORDER — GLYCOPYRROLATE 0.2 MG/ML IJ SOLN
INTRAMUSCULAR | Status: DC | PRN
  Administered 2023-10-03: .4 mg via INTRAVENOUS

## 2023-10-03 MED ORDER — HYDROMORPHONE HCL 1 MG/ML IJ SOLN
INTRAMUSCULAR | Status: AC
  Filled 2023-10-03: qty 0.5

## 2023-10-03 MED ORDER — FENTANYL CITRATE (PF) 100 MCG/2ML IJ SOLN
INTRAMUSCULAR | Status: AC
  Filled 2023-10-03: qty 2

## 2023-10-03 MED ORDER — HYDROMORPHONE HCL 1 MG/ML IJ SOLN
INTRAMUSCULAR | Status: DC | PRN
  Administered 2023-10-03 (×2): .25 mg via INTRAVENOUS

## 2023-10-03 MED ORDER — AMISULPRIDE (ANTIEMETIC) 5 MG/2ML IV SOLN: 10.0000 mg | Freq: Once | INTRAVENOUS | Status: DC | PRN

## 2023-10-03 MED ORDER — PROPOFOL 10 MG/ML IV BOLUS
INTRAVENOUS | Status: DC | PRN
  Administered 2023-10-03: 120 mg via INTRAVENOUS

## 2023-10-03 MED ORDER — NEOSTIGMINE METHYLSULFATE 3 MG/3ML IV SOSY
PREFILLED_SYRINGE | INTRAVENOUS | Status: AC
  Filled 2023-10-03: qty 3

## 2023-10-03 MED ORDER — ONDANSETRON HCL 4 MG/2ML IJ SOLN
INTRAMUSCULAR | Status: DC | PRN
  Administered 2023-10-03: 4 mg via INTRAVENOUS

## 2023-10-03 MED ORDER — STERILE WATER FOR IRRIGATION IR SOLN
Status: DC | PRN
  Administered 2023-10-03: 1000 mL

## 2023-10-03 MED ORDER — PHENYLEPHRINE 80 MCG/ML (10ML) SYRINGE FOR IV PUSH (FOR BLOOD PRESSURE SUPPORT)
PREFILLED_SYRINGE | INTRAVENOUS | Status: AC
  Filled 2023-10-03: qty 10

## 2023-10-03 MED ORDER — EPHEDRINE SULFATE-NACL 50-0.9 MG/10ML-% IV SOSY
PREFILLED_SYRINGE | INTRAVENOUS | Status: DC | PRN
  Administered 2023-10-03 (×2): 5 mg via INTRAVENOUS
  Administered 2023-10-03: 10 mg via INTRAVENOUS
  Administered 2023-10-03: 5 mg via INTRAVENOUS

## 2023-10-03 MED ORDER — DEXAMETHASONE SODIUM PHOSPHATE 10 MG/ML IJ SOLN
INTRAMUSCULAR | Status: DC | PRN
  Administered 2023-10-03: 5 mg via INTRAVENOUS

## 2023-10-03 MED ORDER — CHLORHEXIDINE GLUCONATE 0.12 % MT SOLN
15.0000 mL | Freq: Once | OROMUCOSAL | Status: AC
  Administered 2023-10-03: 15 mL via OROMUCOSAL
  Filled 2023-10-03: qty 15

## 2023-10-03 MED ORDER — GLYCOPYRROLATE PF 0.2 MG/ML IJ SOSY
PREFILLED_SYRINGE | INTRAMUSCULAR | Status: AC
  Filled 2023-10-03: qty 2

## 2023-10-03 MED ORDER — ORAL CARE MOUTH RINSE: 15.0000 mL | Freq: Once | OROMUCOSAL | Status: AC

## 2023-10-03 MED ORDER — MIDAZOLAM HCL 2 MG/2ML IJ SOLN
INTRAMUSCULAR | Status: DC | PRN
  Administered 2023-10-03: 2 mg via INTRAVENOUS

## 2023-10-03 SURGICAL SUPPLY — 64 items
BAG COUNTER SPONGE SURGICOUNT (BAG) ×1 IMPLANT
BIT DRILL 3.3MM (BIT) IMPLANT
BIT DRILL NCB FEM QC 4.3X245 (BIT) IMPLANT
BIT DRILL QC 3.3X195 (BIT) IMPLANT
BLADE CLIPPER SURG (BLADE) IMPLANT
BNDG ELASTIC 4X5.8 VLCR STR LF (GAUZE/BANDAGES/DRESSINGS) ×1 IMPLANT
BNDG ELASTIC 6INX 5YD STR LF (GAUZE/BANDAGES/DRESSINGS) ×1 IMPLANT
BNDG GAUZE DERMACEA FLUFF 4 (GAUZE/BANDAGES/DRESSINGS) ×1 IMPLANT
BRUSH SCRUB EZ PLAIN DRY (MISCELLANEOUS) ×2 IMPLANT
CANISTER SUCTION 3000ML PPV (SUCTIONS) ×1 IMPLANT
CAP LOCK NCB (Cap) IMPLANT
COVER SURGICAL LIGHT HANDLE (MISCELLANEOUS) ×1 IMPLANT
DRAPE C-ARM 42X72 X-RAY (DRAPES) ×1 IMPLANT
DRAPE C-ARMOR (DRAPES) ×1 IMPLANT
DRAPE IMP U-DRAPE 54X76 (DRAPES) ×1 IMPLANT
DRAPE SURG ORHT 6 SPLT 77X108 (DRAPES) ×3 IMPLANT
DRAPE U-SHAPE 47X51 STRL (DRAPES) ×1 IMPLANT
DRSG ADAPTIC 3X8 NADH LF (GAUZE/BANDAGES/DRESSINGS) ×1 IMPLANT
DRSG MEPILEX POST OP 4X12 (GAUZE/BANDAGES/DRESSINGS) IMPLANT
DRSG MEPILEX POST OP 4X8 (GAUZE/BANDAGES/DRESSINGS) IMPLANT
ELECTRODE REM PT RTRN 9FT ADLT (ELECTROSURGICAL) ×1 IMPLANT
EVACUATOR 1/8 PVC DRAIN (DRAIN) IMPLANT
EVACUATOR 3/16 PVC DRAIN (DRAIN) IMPLANT
GAUZE PAD ABD 8X10 STRL (GAUZE/BANDAGES/DRESSINGS) ×4 IMPLANT
GAUZE SPONGE 4X4 12PLY STRL (GAUZE/BANDAGES/DRESSINGS) ×1 IMPLANT
GLOVE BIO SURGEON STRL SZ7.5 (GLOVE) ×1 IMPLANT
GLOVE BIO SURGEON STRL SZ8 (GLOVE) ×1 IMPLANT
GLOVE BIOGEL PI IND STRL 7.5 (GLOVE) ×1 IMPLANT
GLOVE BIOGEL PI IND STRL 8 (GLOVE) ×1 IMPLANT
GLOVE SURG ORTHO LTX SZ7.5 (GLOVE) ×2 IMPLANT
GOWN STRL REUS W/ TWL LRG LVL3 (GOWN DISPOSABLE) ×2 IMPLANT
GOWN STRL REUS W/ TWL XL LVL3 (GOWN DISPOSABLE) ×1 IMPLANT
GRAFT BNE CANC CHIPS 1-8 20CC (Bone Implant) IMPLANT
KIT BASIN OR (CUSTOM PROCEDURE TRAY) ×1 IMPLANT
KIT TURNOVER KIT B (KITS) ×1 IMPLANT
KWIRE FXSTD 280X2XNS SS (WIRE) IMPLANT
NDL 22X1.5 STRL (OR ONLY) (MISCELLANEOUS) IMPLANT
NEEDLE 22X1.5 STRL (OR ONLY) (MISCELLANEOUS) IMPLANT
NS IRRIG 1000ML POUR BTL (IV SOLUTION) ×1 IMPLANT
PACK TOTAL JOINT (CUSTOM PROCEDURE TRAY) ×1 IMPLANT
PACK UNIVERSAL I (CUSTOM PROCEDURE TRAY) ×1 IMPLANT
PAD ARMBOARD POSITIONER FOAM (MISCELLANEOUS) ×2 IMPLANT
PAD CAST 4YDX4 CTTN HI CHSV (CAST SUPPLIES) ×1 IMPLANT
PADDING CAST COTTON 6X4 STRL (CAST SUPPLIES) ×1 IMPLANT
PLATE BONE LOCK 238MM 9HOLE (Plate) IMPLANT
SCREW 5.0 80MM (Screw) IMPLANT
SCREW CORTICAL 5.0X95MM (Screw) IMPLANT
SCREW CORTICAL NCB 5.0X90MM (Screw) IMPLANT
SCREW FEM ST NCB 5X100 (Screw) IMPLANT
SCREW NCB 4.0MX42M (Screw) IMPLANT
SCREW NCB 4.0MX46M (Screw) IMPLANT
SCREW UNICORTICAL NCB 5.0X20 (Screw) IMPLANT
SPONGE T-LAP 18X18 ~~LOC~~+RFID (SPONGE) ×1 IMPLANT
STAPLER SKIN PROX 35W (STAPLE) ×1 IMPLANT
SUCTION TUBE FRAZIER 10FR DISP (SUCTIONS) ×1 IMPLANT
SUT PROLENE 0 CT 2 (SUTURE) IMPLANT
SUT VIC AB 0 CT1 27XBRD ANBCTR (SUTURE) ×2 IMPLANT
SUT VIC AB 1 CT1 27XBRD ANBCTR (SUTURE) ×2 IMPLANT
SUT VIC AB 2-0 CT1 TAPERPNT 27 (SUTURE) ×2 IMPLANT
SYR 20ML ECCENTRIC (SYRINGE) IMPLANT
TOWEL GREEN STERILE (TOWEL DISPOSABLE) ×2 IMPLANT
TOWEL GREEN STERILE FF (TOWEL DISPOSABLE) ×1 IMPLANT
TRAY FOLEY MTR SLVR 16FR STAT (SET/KITS/TRAYS/PACK) IMPLANT
WATER STERILE IRR 1000ML POUR (IV SOLUTION) ×2 IMPLANT

## 2023-10-03 NOTE — Plan of Care (Signed)

## 2023-10-03 NOTE — Transfer of Care (Signed)
 Immediate Anesthesia Transfer of Care Note  Patient: Jeffrey [name]  Procedure(s) Performed: OPEN REDUCTION INTERNAL FIXATION (ORIF) DISTAL FEMUR FRACTURE (Left)  Patient Location: PACU  Anesthesia Type:General  Level of Consciousness: drowsy  Airway & Oxygen  Therapy: Patient Spontanous Breathing and Patient connected to face mask oxygen   Post-op Assessment: Report given to RN and Post -op Vital signs reviewed and stable  Post vital signs: Reviewed and stable  Last Vitals:  Vitals Value Taken Time  BP 169/77 10/03/23 18:46  Temp 98.2   Pulse 58 10/03/23 18:47  Resp 12 10/03/23 18:47  SpO2 98 % 10/03/23 18:47  Vitals shown include unfiled device data.  Last Pain:  Vitals:   10/03/23 1415  TempSrc:   PainSc: 5       Patients Stated Pain Goal: 2 (10/03/23 0940)  Complications: No notable events documented.

## 2023-10-03 NOTE — Progress Notes (Signed)
 PT Cancellation Note  Patient Details Name: ROMOND PIPKINS MRN: [id number] DOB: Feb 06, 1956   Cancelled Treatment:    Reason Eval/Treat Not Completed: Patient not medically ready (await ortho consult). PT order received. Ortho has not yet evaluated. Will await their consult for WB restrictions and other limitations.   Richerd Lipoma, PT  Acute Rehab Services Secure chat preferred Office [phone]    Richerd LITTIE Lipoma 10/03/2023, 9:28 AM

## 2023-10-03 NOTE — Consult Note (Signed)
 Orthopaedic Trauma Service (OTS) Consult   Patient ID: Jeffrey [name] MRN: [id number] DOB/AGE: [birth year] 68 y.o.   Reason for Consult: Left periprosthetic distal femur fracture Referring Physician: Emery Fuss, MD (internal medicine)   HPI: Jeffrey [name] is an 68 y.o. male with a history of CAD, diabetes, A-fib on Eliquis  who is 5 months s/p left total knee arthroplasty by Dr. Jerri who sustained a hyperextension type injury to his left knee on Saturday.  Patient was out [place] when he believes he stepped on a piece of foliage which caused him to hyperextend his leg.  Patient did have immediate onset of pain but really did not think much of it and thought he would just walk it off however pain began to increase over the next several days.  He presented to Ortho care yesterday afternoon x-rays were obtained which demonstrated fracture to his left distal femur between the end of his intramedullary nail and total knee arthroplasty.  Pain is stayed the same since admission.  No significant worsening noted numbness or tingling noted.  Pain is relieved with rest and pain medication.  It is exacerbated with movement.  Patient has sustained numerous injuries to the left femur over the years.  He sustained a distal femur fracture back in the late 80s which was repaired with an ORIF by Dr. Anderson here in town.  Approximately 2 to 3 years ago patient was fishing on [facility] when he fell causing a fracture around that plate.  He was eventually taken to [facility] where his plate was removed and he was treated with intramedullary nailing.  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  Mild thickening of his mitral valve but no regurgitation or stenosis.  Tricuspid valve is  unremarkable.  Aortic and pulm monic valves or unremarkable  Last hemoglobin A1c was in March 2025 which was 6.1%   Past Medical History:  Diagnosis Date   Aneurysm of infrarenal abdominal aorta (HCC)    CAD (coronary artery disease)    4 stents   Carotid artery disease (HCC)    Left ICA   Diabetes mellitus without complication (HCC)    Dysrhythmia    A. Fib   Hyperlipidemia    Intracerebral hemorrhage (HCC) 1960   Peripheral neuropathy    Post-traumatic osteoarthritis of left knee     Past Surgical History:  Procedure Laterality Date   APPENDECTOMY  1960   I & D EXTREMITY Right 06/19/2020   Procedure: IRRIGATION AND DEBRIDEMENT LONG FINGER;  Surgeon: Murrell Drivers, MD;  Location: [facility] OR;  Service: Orthopedics;  Laterality: Right;   LEFT HEART CATH  06/20/2018   ORIF FEMUR FRACTURE  1988   TOTAL KNEE ARTHROPLASTY Left 05/12/2023   Procedure: LEFT TOTAL KNEE ARTHROPLASTY;  Surgeon: Jerri Kay CHRISTELLA, MD;  Location: [facility] OR;  Service: Orthopedics;  Laterality: Left;   VARICOSE VEIN SURGERY Right    right leg    Family History  Problem Relation Age of Onset   Vision loss Mother    Diabetes Mother    Crohn's disease Father     Social History:  reports that he quit smoking about 10 years ago. His smoking use included cigarettes. He has never  used smokeless tobacco. He reports that he does not drink alcohol and does not use drugs.  Allergies:  Allergies  Allergen Reactions   Shellfish-Derived Products Other (See Comments)    GI upset- this tears up my stomach    Medications: I have reviewed the patient's current medications. Current Meds  Medication Sig   allopurinol  (ZYLOPRIM ) 100 MG tablet Take 100 mg by mouth daily.   apixaban  (ELIQUIS ) 5 MG TABS tablet Take 5 mg by mouth 2 (two) times daily.   ascorbic acid  (VITAMIN C ) 500 MG tablet Take 500 mg by mouth daily.   atorvastatin  (LIPITOR ) 80 MG tablet Take 80 mg by mouth at bedtime.   citalopram  (CELEXA ) 20 MG tablet Take  10 mg by mouth in the morning.   colchicine  0.6 MG tablet Take 0.6 mg by mouth daily as needed (gout flares).   empagliflozin  (JARDIANCE ) 25 MG TABS tablet Take 12.5 mg by mouth in the morning.   famotidine  (PEPCID ) 20 MG tablet Take 20 mg by mouth 2 (two) times daily.   furosemide  (LASIX ) 20 MG tablet Take 20 mg by mouth in the morning.   gabapentin  (NEURONTIN ) 300 MG capsule Take 300-600 mg by mouth See admin instructions. Take 1 capsule (300 mg) by mouth once daily in the morning & take 2 capsules (600 mg) by mouth at bedtime.   HYDROcodone -acetaminophen  (NORCO/VICODIN) 5-325 MG tablet Take 1 tablet by mouth 2 (two) times daily as needed for moderate pain (pain score 4-6).   methocarbamol  (ROBAXIN -750) 750 MG tablet Take 1 tablet (750 mg total) by mouth 3 (three) times daily as needed for muscle spasms.   metoprolol  tartrate (LOPRESSOR ) 50 MG tablet Take 25 mg by mouth 2 (two) times daily.   Multiple Vitamins-Minerals (PRESERVISION AREDS 2) CAPS Chew 1 capsule by mouth in the morning and at bedtime.   ondansetron  (ZOFRAN ) 4 MG tablet Take 1 tablet (4 mg total) by mouth every 8 (eight) hours as needed for nausea or vomiting.   oxyCODONE -acetaminophen  (PERCOCET) 5-325 MG tablet Take 1-2 tablets by mouth every 12 (twelve) hours as needed.   ranolazine  (RANEXA ) 500 MG 12 hr tablet Take 500 mg by mouth 2 (two) times daily.   zinc  sulfate, 50mg  elemental zinc , 220 (50 Zn) MG capsule Take 220 mg by mouth daily.     Results for orders placed or performed during the hospital encounter of 10/02/23 (from the past 48 hours)  Basic metabolic panel     Status: Abnormal   Collection Time: 10/02/23  5:36 PM  Result Value Ref Range   Sodium 135 135 - 145 mmol/L   Potassium 3.0 (L) 3.5 - 5.1 mmol/L   Chloride 102 98 - 111 mmol/L   CO2 22 22 - 32 mmol/L   Glucose, Bld 215 (H) 70 - 99 mg/dL    Comment: Glucose reference range applies only to samples taken after fasting for at least 8 hours.   BUN 21 8 - 23  mg/dL   Creatinine, Ser 8.91 0.61 - 1.24 mg/dL   Calcium  8.8 (L) 8.9 - 10.3 mg/dL   GFR, Estimated >39 >39 mL/min    Comment: (NOTE) Calculated using the CKD-EPI Creatinine Equation (2021)    Anion gap 11 5 - 15    Comment: Performed at [facility], [address] [place], [place] [address]  CBC     Status: Abnormal   Collection Time: 10/02/23  5:36 PM  Result Value Ref Range   WBC 13.7 (H) 4.0 -  10.5 K/uL   RBC 4.54 4.22 - 5.81 MIL/uL   Hemoglobin 14.2 13.0 - 17.0 g/dL   HCT 58.6 60.9 - 47.9 %   MCV 91.0 80.0 - 100.0 fL   MCH 31.3 26.0 - 34.0 pg   MCHC 34.4 30.0 - 36.0 g/dL   RDW 85.7 88.4 - 84.4 %   Platelets 206 150 - 400 K/uL   nRBC 0.0 0.0 - 0.2 %    Comment: Performed at [facility], [address], [place], [place] [address]  Comprehensive metabolic panel     Status: Abnormal   Collection Time: 10/03/23  6:04 AM  Result Value Ref Range   Sodium 139 135 - 145 mmol/L   Potassium 3.9 3.5 - 5.1 mmol/L   Chloride 105 98 - 111 mmol/L   CO2 23 22 - 32 mmol/L   Glucose, Bld 219 (H) 70 - 99 mg/dL    Comment: Glucose reference range applies only to samples taken after fasting for at least 8 hours.   BUN 18 8 - 23 mg/dL   Creatinine, Ser 9.13 0.61 - 1.24 mg/dL   Calcium  8.8 (L) 8.9 - 10.3 mg/dL   Total Protein 6.5 6.5 - 8.1 g/dL   Albumin 3.3 (L) 3.5 - 5.0 g/dL   AST 18 15 - 41 U/L   ALT 22 0 - 44 U/L   Alkaline Phosphatase 105 38 - 126 U/L   Total Bilirubin 1.0 0.0 - 1.2 mg/dL   GFR, Estimated >39 >39 mL/min    Comment: (NOTE) Calculated using the CKD-EPI Creatinine Equation (2021)    Anion gap 11 5 - 15    Comment: Performed at [facility], [address] [place], [place] [address]  CBC     Status: Abnormal   Collection Time: 10/03/23  6:04 AM  Result Value Ref Range   WBC 9.6 4.0 - 10.5 K/uL   RBC 4.19 (L) 4.22 - 5.81 MIL/uL   Hemoglobin 13.2 13.0 - 17.0 g/dL   HCT 61.7 (L) 60.9 - 47.9 %   MCV 91.2 80.0 - 100.0 fL   MCH 31.5 26.0 - 34.0 pg    MCHC 34.6 30.0 - 36.0 g/dL   RDW 85.5 88.4 - 84.4 %   Platelets 188 150 - 400 K/uL   nRBC 0.0 0.0 - 0.2 %    Comment: Performed at [facility], [address], [place], [place] [address]  HIV Antibody (routine testing w rflx)     Status: None   Collection Time: 10/03/23  6:04 AM  Result Value Ref Range   HIV Screen 4th Generation wRfx Non Reactive Non Reactive    Comment: Performed at [facility], [address], [place], [place] [address]    DG Femur Min 2 Views Left Result Date: 10/02/2023 CLINICAL DATA:  Suspected femoral fracture EXAM: LEFT FEMUR 2 VIEWS COMPARISON:  Knee radiograph June 27, 2023 FINDINGS: There is a transverse fracture line involving the distal femoral metaphysis about the inferior tip of the intramedullary rod with mild impaction and displacement. The total knee arthroplasty hardware is otherwise in proper position. Mild soft tissue swelling around the knee joint. Proximal part of the femoral rod and intertrochanteric screw is unremarkable. IMPRESSION: Distal femoral metaphyseal fracture. Electronically Signed   By: Megan  Zare M.D.   On: 10/02/2023 18:38   DG Pelvis 1-2 Views Result Date: 10/02/2023 CLINICAL DATA:  Femur fracture. Suspected femur fracture. Leg injury 2 days ago. Femur fracture  on outpatient imaging. Unable to bear weight. Multiple previous surgeries. EXAM: PELVIS - 1-2 VIEW COMPARISON:  CT abdomen and pelvis 05/21/2018 FINDINGS: Postoperative changes with previous internal fixation of the left hip. No acute fracture or dislocation is identified in the pelvis or visualized hips. SI joints and symphysis pubis are not displaced. No focal bone lesions are identified. Degenerative changes in the lower lumbar spine and bilateral hips. Soft tissues are unremarkable. IMPRESSION: Previous internal fixation of the left hip. Degenerative changes in the wall bar spine and hips. No acute fracture or dislocation is identified. Electronically Signed   By:  Elsie Gravely M.D.   On: 10/02/2023 18:25    Intake/Output      07/28 0701 07/29 0700 07/29 0701 07/30 0700   Urine (mL/kg/hr) 600 400 (1.4)   Total Output 600 400   Net -600 -400        Urine Occurrence  1 x      Review of Systems  Constitutional:  Negative for chills and fever.  Respiratory:  Negative for shortness of breath.   Cardiovascular:  Negative for chest pain and palpitations.  Gastrointestinal:  Negative for nausea and vomiting.  Musculoskeletal:        Left knee pain    Blood pressure (!) 141/74, pulse 62, temperature 97.6 F (36.4 C), temperature source Oral, resp. rate 18, height 6' 1 (1.854 m), weight 98.4 kg, SpO2 98%. Physical Exam Vitals and nursing note reviewed.  Constitutional:      General: He is not in acute distress.    Appearance: Normal appearance.     Comments: Very pleasant 68 year old male  HENT:     Head: Normocephalic.  Eyes:     Extraocular Movements: Extraocular movements intact.  Cardiovascular:     Comments: S1 and S2, regular rate Pulmonary:     Effort: Pulmonary effort is normal.     Comments: Unlabored Abdominal:     Comments: Soft, nontender, nondistended  Musculoskeletal:     Comments: Left lower extremity Knee immobilizer is in place, this was removed Moderate swelling to left knee Multiple previous surgical wounds are noted including anterior knee and lateral left thigh.  These are all well-healed Surgical wounds left hip are well-healed Extremity is warm + DP pulse DPN, SPN, TN sensory functions grossly intact EHL, FHL, lesser toe motor functions are intact Ankle flexion, extension, inversion eversion are intact Skin changes consistent with PVD appreciated No DCT Compartments are soft No pain with passive stretching of toes or ankle Did not manipulate knee or hip due to known fracture No other traumatic wounds appreciated Various abrasions throughout his extremities noted  Skin:    General: Skin is warm.      Capillary Refill: Capillary refill takes less than 2 seconds.  Neurological:     Mental Status: He is alert and oriented to person, place, and time.     Comments: Did not assess coordination or gait  Psychiatric:        Attention and Perception: Attention and perception normal.        Mood and Affect: Mood and affect normal.        Speech: Speech normal.        Behavior: Behavior is cooperative.     Assessment/Plan:  68 year old male 88-month status post left total knee arthroplasty with acute left periprosthetic distal femur fracture  - Left periprosthetic distal femur fracture  Patient has a total knee as well as an intramedullary implant present which does further  complicate surgery  Will obtain CT scan stat preoperatively to evaluate amount of bone present distally.  We may need to remove his intramedullary nail or at the very least the locking bolt distally  Would anticipate ORIF with plate  Touchdown weightbearing versus nonweightbearing postoperatively.  Will determine this based on intraoperative findings  No knee range of motion restrictions   Risks and benefits of surgery reviewed the patient and he wishes to proceed   Multiple previous surgeries and his diabetes do increase his risk of infection and nonunion  - Pain management:  Multimodal  Minimize narcotics  - ABL anemia/Hemodynamics  Stable  - Medical issues   Per primary  - DVT/PE prophylaxis:  Resume Eliquis  postoperatively  - ID:   Perioperative antibiotics  - Metabolic Bone Disease:  Will check vitamin D  levels  - Activity:  As above  - FEN/GI prophylaxis/Foley/Lines:  N.p.o.  Carb modified diet postoperatively  - Impediments to fracture healing:  Diabetes  Vascular disease  Multiple surgeries on same extremity near the zone of injury - Dispo:  OR later today for ORIF left distal femur   Francis MICAEL Mt, PA-C [phone] (C) 10/03/2023, 9:49 AM  [facility] [address] [place] [address] [phone] (F)    After 5pm and on the weekends please log on to Amion, go to orthopaedics and the look under the [facility] Call for the provider(s) on call. You can also call our office at [phone] and then follow the prompts to be connected to the call team.

## 2023-10-03 NOTE — Anesthesia Preprocedure Evaluation (Addendum)
 Anesthesia Evaluation  Patient identified by MRN, date of birth, ID band Patient awake    Reviewed: Allergy & Precautions, NPO status , Patient's Chart, lab work & pertinent test results, reviewed documented beta blocker date and time   Airway Mallampati: III  TM Distance: >3 FB Neck ROM: Full    Dental  (+) Edentulous Upper, Dental Advisory Given   Pulmonary former smoker   Pulmonary exam normal breath sounds clear to auscultation       Cardiovascular + CAD and + Cardiac Stents  Normal cardiovascular exam+ dysrhythmias Atrial Fibrillation  Rhythm:Regular Rate:Normal  TTE 2023 1. Left ventricular ejection fraction, by estimation, is 60 to 65%. The  left ventricle has normal function. The left ventricle has no regional  wall motion abnormalities. Left ventricular diastolic parameters are  indeterminate.   2. Right ventricular systolic function is normal. The right ventricular  size is mildly enlarged.   3. Left atrial size was mildly dilated.   4. Right atrial size was mildly dilated.   5. The mitral valve is abnormal. No evidence of mitral valve  regurgitation. No evidence of mitral stenosis.   6. The aortic valve is normal in structure. Aortic valve regurgitation is  not visualized. No aortic stenosis is present.   7. The inferior vena cava is normal in size with <50% respiratory  variability, suggesting right atrial pressure of 8 mmHg.      Neuro/Psych negative neurological ROS  negative psych ROS   GI/Hepatic negative GI ROS, Neg liver ROS,,,  Endo/Other  diabetes, Type 2, Oral Hypoglycemic Agents    Renal/GU negative Renal ROS  negative genitourinary   Musculoskeletal negative musculoskeletal ROS (+)    Abdominal   Peds  Hematology  (+) Blood dyscrasia (eliquis )   Anesthesia Other Findings 68 y.o. male with medical history significant of atrial fibrillation on chronic anticoagulation, diabetes,  hyperlipidemia, history of intracerebral hemorrhage, history of peripheral neuropathy, history of previous traumatic fracture of the distal left femur, carotid artery disease, coronary artery disease who presents to the ER after being sent by orthopedics Dr. Jerri of Ortho care.  His PA ordered an x-ray of his left knee showing periprosthetic left femur fracture  Reproductive/Obstetrics                              Anesthesia Physical Anesthesia Plan  ASA: 3  Anesthesia Plan: General   Post-op Pain Management: Tylenol  PO (pre-op)*   Induction: Intravenous  PONV Risk Score and Plan: 2 and Dexamethasone , Ondansetron  and Treatment may vary due to age or medical condition  Airway Management Planned: Oral ETT  Additional Equipment:   Intra-op Plan:   Post-operative Plan: Extubation in OR  Informed Consent: I have reviewed the patients History and Physical, chart, labs and discussed the procedure including the risks, benefits and alternatives for the proposed anesthesia with the patient or authorized representative who has indicated his/her understanding and acceptance.     Dental advisory given  Plan Discussed with: CRNA  Anesthesia Plan Comments:          Anesthesia Quick Evaluation

## 2023-10-03 NOTE — Progress Notes (Signed)
 [facility] Progress Note Patient: Jeffrey [name] FMW:[id number] DOB: 05-May-1955 DOA: 10/02/2023  DOS: the patient was seen and examined on 10/03/2023  Brief Hospital Course: PMH of CAD, PAD, type II DM, HLD, neuropathy presents to the hospital with complaints of fall. Patient with PMH of left total knee arthroplasty. Over the weekend had a fall which was a mechanical fall [place]. Went to see the orthopedic in the clinic as there was increasing pain and swelling and was referred to admission as he was found to have periprosthetic fracture of the left knee joint.  Assessment and Plan: Distal femur periprosthetic fracture Sustained after mechanical fall. Orthopedic consulted. Scheduled for surgical correction today on 7/29. Pain medication, DVT prophylaxis and weightbearing per orthopedics.  Preoperative medical evaluation Pt can climb a flight of stair, ambulates around house using any device, without any shortness of breath or chest pain. Patient low to moderate risk for perioperative cardiovascular outcome. Recommend No further workup  Bleeding risk will be higher given his need for Eliquis  and last dose was on 7/20 8 in the morning.  Discussed with Orthopedics Long discussion with family and patient at bedside, they understands patient will be at low to moderate risk for the surgical procedure. Alternative surgical intervention were discussed by the surgery team. Family accepts all risks and wants to proceed with surgical intervention.    Paroxysmal atrial fibrillation Rate is controlled. On metoprolol  and Eliquis .  Eliquis  currently on hold.   Hypokalemia Replace.   CAD. Carotid artery stenosis No recent angina symptoms. He reports that he had a stress test as well as cath few years ago. Follows up with cardiology on a regular basis.   Stable.  Continue home regimen.   Leukocytosis:  Probably secondary to inflammation.  Continue to monitor   Type  2 diabetes mellitus, well-controlled. Without long-term insulin  use. With neuropathy.  On Jardiance . Continue.sliding scale   Subjective: pain present no acute fever or chills.   Physical Exam: General: in Mild distress, No Rash Cardiovascular: S1 and S2 Present, No Murmur Respiratory: Good respiratory effort, Bilateral Air entry present. No Crackles, No wheezes Abdomen: Bowel Sound present, No tenderness Extremities: trace edema Neuro: Alert and oriented x3, no new focal deficit   Data Reviewed: I have Reviewed nursing notes, Vitals, and Lab results. Since last encounter, pertinent lab results CBC BMP   . I have ordered test including CBC BMP  . I have discussed pt's care plan and test results with Orthopedics   .  Disposition: Status is: Inpatient Remains inpatient appropriate because: monitor for post op recover.   SCDs Start: 10/02/23 2119   Family Communication: family at bedside Level of care: Med-Surg   Vitals:   10/02/23 2018 10/03/23 0607 10/03/23 0748 10/03/23 0944  BP:  137/65 (!) 141/74   Pulse:  (!) 55 (!) 51 62  Resp:  18 18   Temp:  (!) 97.4 F (36.3 C) 97.6 F (36.4 C)   TempSrc:  Oral Oral   SpO2:  99% 98%   Weight: 98.4 kg     Height: 6' 1 (1.854 m)        Author: Yetta Blanch, MD 10/03/2023 4:09 PM  Please look on [email or web address] to find out who is on call.

## 2023-10-03 NOTE — Progress Notes (Signed)
 Orthopedic Tech Progress Note Patient Details:  Jeffrey [name] [birth year] [id number]  Ortho Devices Type of Ortho Device: Bone foam zero knee Ortho Device/Splint Location: lle Ortho Device/Splint Interventions: Ordered, Application, Adjustment   Post Interventions Patient Tolerated: Well Instructions Provided: Care of device, Adjustment of device  [name] 10/03/2023, 8:24 PM

## 2023-10-04 ENCOUNTER — Encounter (HOSPITAL_COMMUNITY): Payer: Self-pay | Admitting: Orthopedic Surgery

## 2023-10-04 DIAGNOSIS — S72352A Displaced comminuted fracture of shaft of left femur, initial encounter for closed fracture: Secondary | ICD-10-CM

## 2023-10-04 LAB — CBC
HCT: 36.9 % — ABNORMAL LOW (ref 39.0–52.0)
Hemoglobin: 12.4 g/dL — ABNORMAL LOW (ref 13.0–17.0)
MCH: 30.9 pg (ref 26.0–34.0)
MCHC: 33.6 g/dL (ref 30.0–36.0)
MCV: 92 fL (ref 80.0–100.0)
Platelets: 199 K/uL (ref 150–400)
RBC: 4.01 MIL/uL — ABNORMAL LOW (ref 4.22–5.81)
RDW: 14.5 % (ref 11.5–15.5)
WBC: 12 K/uL — ABNORMAL HIGH (ref 4.0–10.5)
nRBC: 0 % (ref 0.0–0.2)

## 2023-10-04 LAB — VITAMIN D 25 HYDROXY (VIT D DEFICIENCY, FRACTURES): Vit D, 25-Hydroxy: 30.94 ng/mL (ref 30–100)

## 2023-10-04 MED ORDER — VITAMIN D 25 MCG (1000 UNIT) PO TABS
2000.0000 [IU] | ORAL_TABLET | Freq: Two times a day (BID) | ORAL | Status: DC
  Administered 2023-10-04 – 2023-10-06 (×5): 2000 [IU] via ORAL
  Filled 2023-10-04 (×5): qty 2

## 2023-10-04 NOTE — Progress Notes (Signed)
 Orthopaedic Trauma Service Progress Note  Patient ID: Jeffrey [name] MRN: [id number] DOB/AGE: [birth year] 68 y.o.  Subjective:  Feels better Pain controlled No new issues    ROS As above  Today's  total administered Morphine Milligram Equivalents: 35 Yesterday's total administered Morphine Milligram Equivalents: 185  Objective:   VITALS:   Vitals:   10/03/23 1930 10/03/23 2308 10/04/23 0430 10/04/23 0730  BP: (!) 158/66 (!) 118/57 137/70 109/74  Pulse: 60 61 (!) 56 60  Resp: 11 16 16 18   Temp:  97.8 F (36.6 C) 97.7 F (36.5 C) 98.4 F (36.9 C)  TempSrc:  Oral Oral Oral  SpO2: 93% 94% 98% 98%  Weight:      Height:        Estimated body mass index is 28.63 kg/m as calculated from the following:   Height as of this encounter: 6' 1 (1.854 m).   Weight as of this encounter: 98.4 kg.   Intake/Output      07/29 0701 07/30 0700 07/30 0701 07/31 0700   P.O.  360   I.V. (mL/kg) 1627.9 (16.5)    IV Piggyback 100    Total Intake(mL/kg) 1727.9 (17.6) 360 (3.7)   Urine (mL/kg/hr) 900 (0.4)    Blood 225    Total Output 1125    Net +602.9 +360        Urine Occurrence 1 x      LABS  Results for orders placed or performed during the hospital encounter of 10/02/23 (from the past 24 hours)  Glucose, capillary     Status: Abnormal   Collection Time: 10/03/23  1:46 PM  Result Value Ref Range   Glucose-Capillary 163 (H) 70 - 99 mg/dL  Glucose, capillary     Status: Abnormal   Collection Time: 10/03/23  6:49 PM  Result Value Ref Range   Glucose-Capillary 180 (H) 70 - 99 mg/dL  VITAMIN D  25 Hydroxy (Vit-D Deficiency, Fractures)     Status: None   Collection Time: 10/04/23  5:46 AM  Result Value Ref Range   Vit D, 25-Hydroxy 30.94 30 - 100 ng/mL  CBC     Status: Abnormal   Collection Time: 10/04/23  5:46 AM  Result Value Ref Range   WBC 12.0 (H) 4.0 - 10.5 K/uL   RBC 4.01 (L) 4.22 - 5.81  MIL/uL   Hemoglobin 12.4 (L) 13.0 - 17.0 g/dL   HCT 63.0 (L) 60.9 - 47.9 %   MCV 92.0 80.0 - 100.0 fL   MCH 30.9 26.0 - 34.0 pg   MCHC 33.6 30.0 - 36.0 g/dL   RDW 85.4 88.4 - 84.4 %   Platelets 199 150 - 400 K/uL   nRBC 0.0 0.0 - 0.2 %     PHYSICAL EXAM:   Gen: sitting up in bed, NAD, looks good  Lungs: unlabored Ext:       Left Lower Extremity Dressing is clean, dry and intact  Extremity is warm  No DCT  Compartments are soft  No pain out of proportion with passive stretching of his toes or ankle  DPN, SPN, TN sensory functions are intact  EHL, FHL, lesser toe motor functions intact  Ankle flexion, extension, inversion eversion intact  + DP pulse  Good clinical alignment of his left leg is noted  Assessment/Plan: 1 Day Post-Op  Principal Problem:   Closed left femoral fracture (HCC) Active Problems:   Atrial fibrillation (HCC)   Post-traumatic osteoarthritis of left knee   Hypokalemia   Anti-infectives (From admission, onward)    Start     Dose/Rate Route Frequency Ordered Stop   10/03/23 2200  ceFAZolin  (ANCEF ) IVPB 2g/100 mL premix        2 g 200 mL/hr over 30 Minutes Intravenous Every 8 hours 10/03/23 1955 10/04/23 2159   10/03/23 1400  ceFAZolin  (ANCEF ) IVPB 2g/100 mL premix        2 g 200 mL/hr over 30 Minutes Intravenous On call to O.R. 10/03/23 1007 10/03/23 1625     .  POD/HD#: 49  68 year old male 6-month status post left total knee arthroplasty with acute left periprosthetic distal femur fracture   - Left periprosthetic distal femur fracture s/p ORIF               TDWB L leg with assistance   ROM as tolerated L knee and hip    Therapy evals   Ice and elevate    Bone foam when not working on ROM    Dressing change tomorrow or Friday    No Bracing   PT- please teach HEP for L knee ROM- AROM, PROM. Prone exercises as well. No ROM restrictions.  Quad sets, SLR, LAQ, SAQ, heel slides, stretching,   Ankle theraband program, heel cord  stretching, toe towel curls, etc  No pillows under bend of knee when at rest, ok to place under heel to help work on extension. Can also use zero knee bone foam if available DO NOT LET KNEE REST IN FLEXION!!!!!   - Pain management:               Multimodal               Minimize narcotics   - ABL anemia/Hemodynamics               Stable   - Medical issues                Per primary        Peripheral neuropathy    Chronic but increases risk of falling    - DVT/PE prophylaxis:               Resume Eliquis     - ID:                Perioperative antibiotics   - Metabolic Bone Disease:               Vitamin d  low normal    Supplement    - Activity:               As above   - FEN/GI prophylaxis/Foley/Lines:               Carb modified diet   - Impediments to fracture healing:               Diabetes               Vascular disease               Multiple surgeries on same extremity near the zone of injury - Dispo:              Therapy evals  CIR consult?     Francis MICAEL Mt, PA-C [phone] (C) 10/04/2023, 10:35 AM  [facility] [address] [place]  [place] [address] [phone] (O) [phone] (F)    After 5pm and on the weekends please log on to Amion, go to orthopaedics and the look under the [facility] Call for the provider(s) on call. You can also call our office at [phone] and then follow the prompts to be connected to the call team.  Patient ID: Jeffrey [name], male   DOB: Nov 19, 1955, 68 y.o.   MRN: [id number]

## 2023-10-04 NOTE — Progress Notes (Signed)
 PROGRESS NOTE    Jeffrey [name]  FMW:[id number] DOB: 02/18/1956 DOA: 10/02/2023 PCP: [facility]   Brief Narrative:  This 68 yrs old Male with PMH significant of CAD, PAD, type II DM, HLD, Neuropathy presents to the hospital S/P fall. Patient has prior history of left total knee arthroplasty. Over the weekend had a fall which was a mechanical fall [place]. He went to see the orthopedic in the clinic as there was increasing pain and swelling and was referred to ED for admission as he was found to have periprosthetic fracture of the left knee joint.  Status post ORIF.  POD #2.  Assessment & Plan:   Principal Problem:   Closed left femoral fracture (HCC) Active Problems:   Atrial fibrillation (HCC)   Post-traumatic osteoarthritis of left knee   Hypokalemia   Distal femur periprosthetic fracture: Sustained after mechanical fall. Orthopedic consulted. He underwent successful ORIF on 7/29. Continue Pain medication, DVT prophylaxis and weightbearing per orthopedics.  Paroxysmal atrial fibrillation: Heart rate is controlled. Continue metoprolol  and Eliquis .     Hypokalemia: Replaced. Continue to monitor   CAD. Carotid artery stenosis No recent angina symptoms. He reports that he had a stress test as well as cath few years ago. Follows up with cardiology on a regular basis.   Stable.  Continue home regimen.   Leukocytosis:  Probably secondary to inflammation.  Continue to monitor   Type 2 diabetes mellitus, well-controlled. Without long-term insulin  use. With neuropathy.  On Jardiance . Continue.sliding scale    DVT prophylaxis: Eliquis  Code Status: Full code Family Communication: No family at bed side Disposition Plan:    Status is: Inpatient Remains inpatient appropriate because:  Awaiting SNF placement    Consultants:  Orthopaedics  Procedures: S/P ORIF  Antimicrobials:  Anti-infectives (From admission, onward)    Start      Dose/Rate Route Frequency Ordered Stop   10/03/23 2200  ceFAZolin  (ANCEF ) IVPB 2g/100 mL premix        2 g 200 mL/hr over 30 Minutes Intravenous Every 8 hours 10/03/23 1955 10/04/23 2159   10/03/23 1400  ceFAZolin  (ANCEF ) IVPB 2g/100 mL premix        2 g 200 mL/hr over 30 Minutes Intravenous On call to O.R. 10/03/23 1007 10/03/23 1625       Subjective: Seen and examined at bedside.  Overnight events noted. Patient is status post ORIF POD #2.  Patient reports having pain in the left knee area.  Objective: Vitals:   10/03/23 1930 10/03/23 2308 10/04/23 0430 10/04/23 0730  BP: (!) 158/66 (!) 118/57 137/70 109/74  Pulse: 60 61 (!) 56 60  Resp: 11 16 16 18   Temp:  97.8 F (36.6 C) 97.7 F (36.5 C) 98.4 F (36.9 C)  TempSrc:  Oral Oral Oral  SpO2: 93% 94% 98% 98%  Weight:      Height:        Intake/Output Summary (Last 24 hours) at 10/04/2023 1253 Last data filed at 10/04/2023 0845 Gross per 24 hour  Intake 1860.03 ml  Output 725 ml  Net 1135.03 ml   Filed Weights   10/02/23 1652 10/02/23 2018  Weight: 103 kg 98.4 kg    Examination:  General exam: Appears calm and comfortable , not in any acute distress. Respiratory system: Clear to auscultation. Respiratory effort normal.  RR 16 Cardiovascular system: S1 & S2 heard, RRR. No JVD, murmurs, rubs, gallops or clicks.  Gastrointestinal system: Abdomen is non distended, soft and  non tender.  Normal bowel sounds heard. Central nervous system: Alert and oriented x 3. No focal neurological deficits. Extremities: Status post ORIF left knee tenderness. Skin: No rashes, lesions or ulcers Psychiatry: Judgement and insight appear normal. Mood & affect appropriate.     Data Reviewed: I have personally reviewed following labs and imaging studies  CBC: Recent Labs  Lab 10/02/23 1736 10/03/23 0604 10/04/23 0546  WBC 13.7* 9.6 12.0*  HGB 14.2 13.2 12.4*  HCT 41.3 38.2* 36.9*  MCV 91.0 91.2 92.0  PLT 206 188 199   Basic  Metabolic Panel: Recent Labs  Lab 10/02/23 1736 10/03/23 0604  NA 135 139  K 3.0* 3.9  CL 102 105  CO2 22 23  GLUCOSE 215* 219*  BUN 21 18  CREATININE 1.08 0.86  CALCIUM  8.8* 8.8*   GFR: Estimated Creatinine Clearance: 98.7 mL/min (by C-G formula based on SCr of 0.86 mg/dL). Liver Function Tests: Recent Labs  Lab 10/03/23 0604  AST 18  ALT 22  ALKPHOS 105  BILITOT 1.0  PROT 6.5  ALBUMIN 3.3*   No results for input(s): LIPASE, AMYLASE in the last 168 hours. No results for input(s): AMMONIA in the last 168 hours. Coagulation Profile: No results for input(s): INR, PROTIME in the last 168 hours. Cardiac Enzymes: No results for input(s): CKTOTAL, CKMB, CKMBINDEX, TROPONINI in the last 168 hours. BNP (last 3 results) No results for input(s): PROBNP in the last 8760 hours. HbA1C: No results for input(s): HGBA1C in the last 72 hours. CBG: Recent Labs  Lab 10/03/23 1346 10/03/23 1849  GLUCAP 163* 180*   Lipid Profile: No results for input(s): CHOL, HDL, LDLCALC, TRIG, CHOLHDL, LDLDIRECT in the last 72 hours. Thyroid  Function Tests: No results for input(s): TSH, T4TOTAL, FREET4, T3FREE, THYROIDAB in the last 72 hours. Anemia Panel: No results for input(s): VITAMINB12, FOLATE, FERRITIN, TIBC, IRON, RETICCTPCT in the last 72 hours. Sepsis Labs: No results for input(s): PROCALCITON, LATICACIDVEN in the last 168 hours.  Recent Results (from the past 240 hours)  Surgical PCR screen     Status: None   Collection Time: 10/02/23  8:35 PM   Specimen: Nasal Mucosa; Nasal Swab  Result Value Ref Range Status   MRSA, PCR NEGATIVE NEGATIVE Final   Staphylococcus aureus NEGATIVE NEGATIVE Final    Comment: (NOTE) The Xpert SA Assay (FDA approved for NASAL specimens in patients 73 years of age and older), is one component of a comprehensive surveillance program. It is not intended to diagnose infection nor to guide or  monitor treatment. Performed at [facility], [address] [place], [place] [address]     Radiology Studies: DG Knee Left Port Result Date: 10/03/2023 CLINICAL DATA:  Status post ORIF distal left femoral fracture EXAM: PORTABLE LEFT KNEE - 2 VIEW COMPARISON:  Films from earlier in the same day. FINDINGS: New fixation sideplate is noted along the lateral aspect of the distal femur. Fracture fragments are in near anatomic alignment. Prior left knee replacement is noted. Prior medullary rod is again seen. IMPRESSION: Status post ORIF distal left femoral fracture. Electronically Signed   By: Oneil Devonshire M.D.   On: 10/03/2023 20:29   DG Knee 1-2 Views Left Result Date: 10/03/2023 CLINICAL DATA:  Known distal femoral fracture EXAM: LEFT KNEE - 1-2 VIEW COMPARISON:  06/27/2023 FINDINGS: Medullary rod with distal fixation screws is again identified. A new transverse fracture through the distal metaphysis of the left femur is noted. The left knee replacement is again seen.  Mild posterior displacement of the distal fracture fragment is noted. Considerable soft tissue swelling is seen. IMPRESSION: Distal femoral metaphyseal fracture as described. Electronically Signed   By: Oneil Devonshire M.D.   On: 10/03/2023 19:07   DG FEMUR MIN 2 VIEWS LEFT Result Date: 10/03/2023 CLINICAL DATA:  Known distal left femoral fracture EXAM: LEFT FEMUR 2 VIEWS COMPARISON:  None Available. FLUOROSCOPY TIME:  Radiation Exposure Index (as provided by the fluoroscopic device): 8.27 mGy If the device does not provide the exposure index: Fluoroscopy Time:  1 minute 19 seconds Number of Acquired Images:  16 FINDINGS: Initial images again demonstrate the known femoral rod in satisfactory position. Left knee prosthesis is again seen. Transverse fracture through the distal femoral metaphysis is again seen. Fracture fragments have been predominately reduced. Fixation wires were placed across the fracture or further reduction. Distal  fixation screw femoral rod were then removed. Fixation sideplate with multiple fixation screws was then placed along the lateral aspect of the femur. Multiple fixation screws are noted. Fracture fragments are in near anatomic alignment. IMPRESSION: Status post ORIF of distal left femoral fracture Electronically Signed   By: Oneil Devonshire M.D.   On: 10/03/2023 19:06   DG C-Arm 1-60 Min-No Report Result Date: 10/03/2023 Fluoroscopy was utilized by the requesting physician.  No radiographic interpretation.   DG C-Arm 1-60 Min-No Report Result Date: 10/03/2023 Fluoroscopy was utilized by the requesting physician.  No radiographic interpretation.   DG C-Arm 1-60 Min-No Report Result Date: 10/03/2023 Fluoroscopy was utilized by the requesting physician.  No radiographic interpretation.   CT FEMUR LEFT WO CONTRAST Result Date: 10/03/2023 CLINICAL DATA:  Periprosthetic fracture of the distal left femur. Status post total knee arthroplasty. EXAM: CT OF THE LOWER LEFT EXTREMITY WITHOUT CONTRAST TECHNIQUE: Multidetector CT imaging of the lower left extremity was performed according to the standard protocol. RADIATION DOSE REDUCTION: This exam was performed according to the departmental dose-optimization program which includes automated exposure control, adjustment of the mA and/or kV according to patient size and/or use of iterative reconstruction technique. COMPARISON:  Radiographs of the left femur and knee dated 10/02/2023. FINDINGS: Bones/Joint/Cartilage Acute periprosthetic fracture of the distal left femoral metadiaphysis extending to the distal end of the intramedullary nail and distal interlocking screw. Hardware otherwise appears intact. There is approximately 1.6 cm of posterior displacement and 0.9 cm of medial displacement of the distal fracture component. Prior ORIF of a healed left mid to distal diaphyseal fracture. Status post left total knee arthroplasty with normal alignment. Hardware is intact.  Distal femoral metadiaphyseal fracture margins extend to the undersurface of the superior anterior component of the femoral prosthesis. Mild osteoarthritis of the left hip. Pubic symphysis is anatomically aligned. Ligaments Ligaments are suboptimally evaluated by CT. Muscles and Tendons Muscles are normal. No muscle atrophy. No intramuscular fluid collection or hematoma. Soft tissue Soft tissue swelling of the anterior mid to distal thigh. No loculated fluid collection or hematoma. No soft tissue mass. Visualized intrapelvic contents are grossly unremarkable. IMPRESSION: 1. Acute displaced periprosthetic fracture of the distal left femoral metadiaphysis extending to the distal end of the intramedullary nail and distal interlocking screw. 2. Status post left total knee arthroplasty with normal alignment. Distal femoral metadiaphyseal fracture margins extend to the undersurface of the superior anterior component of the femoral prosthesis at the level of the femoral trochlea. Electronically Signed   By: Harrietta Sherry M.D.   On: 10/03/2023 13:16   CT KNEE LEFT WO CONTRAST Result Date: 10/03/2023 CLINICAL DATA:  Periprosthetic fracture of the distal left femur. Status post total knee arthroplasty. EXAM: CT OF THE LOWER LEFT EXTREMITY WITHOUT CONTRAST TECHNIQUE: Multidetector CT imaging of the lower left extremity was performed according to the standard protocol. RADIATION DOSE REDUCTION: This exam was performed according to the departmental dose-optimization program which includes automated exposure control, adjustment of the mA and/or kV according to patient size and/or use of iterative reconstruction technique. COMPARISON:  Radiographs of the left femur and knee dated 10/02/2023. FINDINGS: Bones/Joint/Cartilage Acute periprosthetic fracture of the distal left femoral metadiaphysis extending to the distal end of the intramedullary nail and distal interlocking screw. Hardware otherwise appears intact. There is  approximately 1.6 cm of posterior displacement and 0.9 cm of medial displacement of the distal fracture component. Prior ORIF of a healed left mid to distal diaphyseal fracture. Status post left total knee arthroplasty with normal alignment. Hardware is intact. Distal femoral metadiaphyseal fracture margins extend to the undersurface of the superior anterior component of the femoral prosthesis. Mild osteoarthritis of the left hip. Pubic symphysis is anatomically aligned. Ligaments Ligaments are suboptimally evaluated by CT. Muscles and Tendons Muscles are normal. No muscle atrophy. No intramuscular fluid collection or hematoma. Soft tissue Soft tissue swelling of the anterior mid to distal thigh. No loculated fluid collection or hematoma. No soft tissue mass. Visualized intrapelvic contents are grossly unremarkable. IMPRESSION: 1. Acute displaced periprosthetic fracture of the distal left femoral metadiaphysis extending to the distal end of the intramedullary nail and distal interlocking screw. 2. Status post left total knee arthroplasty with normal alignment. Distal femoral metadiaphyseal fracture margins extend to the undersurface of the superior anterior component of the femoral prosthesis at the level of the femoral trochlea. Electronically Signed   By: Harrietta Sherry M.D.   On: 10/03/2023 13:16   DG Femur Min 2 Views Left Result Date: 10/02/2023 CLINICAL DATA:  Suspected femoral fracture EXAM: LEFT FEMUR 2 VIEWS COMPARISON:  Knee radiograph June 27, 2023 FINDINGS: There is a transverse fracture line involving the distal femoral metaphysis about the inferior tip of the intramedullary rod with mild impaction and displacement. The total knee arthroplasty hardware is otherwise in proper position. Mild soft tissue swelling around the knee joint. Proximal part of the femoral rod and intertrochanteric screw is unremarkable. IMPRESSION: Distal femoral metaphyseal fracture. Electronically Signed   By: Megan  Zare  M.D.   On: 10/02/2023 18:38   DG Pelvis 1-2 Views Result Date: 10/02/2023 CLINICAL DATA:  Femur fracture. Suspected femur fracture. Leg injury 2 days ago. Femur fracture on outpatient imaging. Unable to bear weight. Multiple previous surgeries. EXAM: PELVIS - 1-2 VIEW COMPARISON:  CT abdomen and pelvis 05/21/2018 FINDINGS: Postoperative changes with previous internal fixation of the left hip. No acute fracture or dislocation is identified in the pelvis or visualized hips. SI joints and symphysis pubis are not displaced. No focal bone lesions are identified. Degenerative changes in the lower lumbar spine and bilateral hips. Soft tissues are unremarkable. IMPRESSION: Previous internal fixation of the left hip. Degenerative changes in the wall bar spine and hips. No acute fracture or dislocation is identified. Electronically Signed   By: Elsie Gravely M.D.   On: 10/02/2023 18:25   Scheduled Meds:  allopurinol   100 mg Oral Daily   ascorbic acid   500 mg Oral Daily   atorvastatin   80 mg Oral QHS   cholecalciferol   2,000 Units Oral BID   citalopram   10 mg Oral q AM   gabapentin   300 mg Oral Daily  And   gabapentin   600 mg Oral QHS   metoprolol  tartrate  25 mg Oral BID   mupirocin  ointment  1 Application Nasal BID   ranolazine   500 mg Oral BID   zinc  sulfate (50mg  elemental zinc )  220 mg Oral Daily   Continuous Infusions:   ceFAZolin  (ANCEF ) IV 2 g (10/04/23 0559)     LOS: 2 days    Time spent: 50 mins    Darcel Dawley, MD [facility]   If 7PM-7AM, please contact night-coverage

## 2023-10-04 NOTE — Progress Notes (Signed)
 Initial Nutrition Assessment  DOCUMENTATION CODES:   Not applicable  INTERVENTION:  Liberalized pt's diet to increase options available while admitted to help meet increased needs  Encouraged adequate intake and ordering high quality protein to help meet increased needs  Encouraged home Fairlife supplements since pt does not want Ensure while admitted  NUTRITION DIAGNOSIS:   Increased nutrient needs related to post-op healing as evidenced by estimated needs.  GOAL:   Patient will meet greater than or equal to 90% of their needs  MONITOR:   PO intake  REASON FOR ASSESSMENT:   Consult Assessment of nutrition requirement/status  ASSESSMENT:   Pt with hx of type 2 diabetes, HLD, peripheral neuropathy, atrial fibrillation, CAD, and traumatic fx of L femur (s/p ORIF 1988, IM placement 2023). Hx L total knee arthroplasty 05/12/23. Admitted from outpatient ortho after xrays showed fx between L femur and total knee arthroplasty.  7/29 ORIF of L femur fx  Pt reports good appetite and ate 100% of breakfast. Pt reports no GI discomforts. Pt reports pain level well controlled following surgery.  PTA, pt reports good appetite where he would eat 3x per day. Breakfast consisted of a bagel with cream cheese or something light. Lunch consisted of a sandwich with protein and vegetables while dinner is either at a restaurant or pt's wife cooks. Pt reports due to recent heat, they have avoided using the oven as much as possible so most meals rely on cold foods. Pt reports eating variety of foods.  Pt reports no recent wt loss, chart review shows no significant changes in wt. Nutrition focused physical exam shows adequate fat and muscle storage, pt appears to be well nourished at baseline.  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 Pt usually works outside in the early morning then again in the evening. Pt reports he recently stopped using cane after his knee  replacement and felt like he was back to baseline before most recent issue with his L hip.  Encouraged adequate intake and ordering high quality protein at all meals to help meet pt's increased calorie and protein needs for post op recovery. Encouraged supplementation of protein shakes, pt reports drinking Fairlife shakes at home and denied Ensure shakes while admitted. Encouraged wife to bring in Fairlife shakes if pt feels like he needs some extra calories and protein, wife agreeable.  Medications reviewed and include:  Vitamin C  500mg  daily Zinc  sulfate 220mg  daily Ranexa  BID  Labs reviewed:  CBG x 24: 163-180 mg/dL J8r 6.1 (08/7972)  NUTRITION - FOCUSED PHYSICAL EXAM:  Flowsheet Row Most Recent Value  Orbital Region No depletion  Upper Arm Region No depletion  Thoracic and Lumbar Region No depletion  Buccal Region No depletion  Temple Region No depletion  Clavicle Bone Region No depletion  Clavicle and Acromion Bone Region No depletion  Scapular Bone Region No depletion  Dorsal Hand No depletion  Patellar Region No depletion  Anterior Thigh Region No depletion  Posterior Calf Region No depletion  Edema (RD Assessment) Mild  [L leg s/p surgery]  Hair Reviewed  Eyes Reviewed  Mouth Reviewed  Skin Reviewed  Nails Reviewed    Diet Order:   Diet Order             Diet Carb Modified Fluid consistency: Thin  Diet effective now                   EDUCATION NEEDS:  Education needs have been addressed  Skin:  Skin Assessment: Skin Integrity Issues: Skin Integrity Issues:: Incisions Incisions: L leg  Last BM:  7/28  Height:   Ht Readings from Last 1 Encounters:  10/02/23 6' 1 (1.854 m)   Weight:   Wt Readings from Last 1 Encounters:  10/02/23 98.4 kg    Ideal Body Weight:  83.6 kg  BMI:  Body mass index is 28.63 kg/m.  Estimated Nutritional Needs:   Kcal:  2300-2500  Protein:  115-160g  Fluid:  >/= 2L   Josette Glance, MS, RDN,  LDN Clinical Dietitian I Please reach out via secure chat

## 2023-10-04 NOTE — Progress Notes (Signed)
 Mobility Specialist Progress Note:    10/04/23 1110  Mobility  Activity Ambulated with assistance (To BR)  Level of Assistance Minimal assist, patient does 75% or more  Assistive Device Four wheel walker  Distance Ambulated (ft) 15 ft  LLE Weight Bearing Per Provider Order TWB  Activity Response Tolerated well  Mobility Referral Yes  Mobility visit 1 Mobility  Mobility Specialist Start Time (ACUTE ONLY) 1020  Mobility Specialist Stop Time (ACUTE ONLY) 1030  Mobility Specialist Time Calculation (min) (ACUTE ONLY) 10 min   Received pt in bed and agreeable to mobility. Required MinA STS, but contact guard during ambulation. Pt requested to use the BR and instructed to use the call light when finished. Able to maintain weight-bearing precautions. All needs met.  Lavanda Pollack Mobility Specialist  Please contact via [employer] or  Rehab Office [phone]

## 2023-10-04 NOTE — Anesthesia Postprocedure Evaluation (Signed)
 Anesthesia Post Note  Patient: Jeffrey [name]  Procedure(s) Performed: OPEN REDUCTION INTERNAL FIXATION (ORIF) DISTAL FEMUR FRACTURE (Left)     Patient location during evaluation: PACU Anesthesia Type: General Level of consciousness: awake and alert Pain management: pain level controlled Vital Signs Assessment: post-procedure vital signs reviewed and stable Respiratory status: spontaneous breathing, nonlabored ventilation, respiratory function stable and patient connected to nasal cannula oxygen  Cardiovascular status: blood pressure returned to baseline and stable Postop Assessment: no apparent nausea or vomiting Anesthetic complications: no   No notable events documented.  Last Vitals:  Vitals:   10/04/23 0430 10/04/23 0730  BP: 137/70 109/74  Pulse: (!) 56 60  Resp: 16 18  Temp: 36.5 C 36.9 C  SpO2: 98% 98%    Last Pain:  Vitals:   10/04/23 0730  TempSrc: Oral  PainSc:    Pain Goal: Patients Stated Pain Goal: 2 (10/03/23 0940)                 [name]

## 2023-10-04 NOTE — Progress Notes (Signed)
  Inpatient Rehabilitation Admissions Coordinator   Rehab consult received. Patient CGA to supervision level on eval. He is not in need of CIR level rehab at this time. We will sign off.  Heron Leavell, RN, MSN Rehab Admissions Coordinator [phone] 10/04/2023 11:09 AM

## 2023-10-04 NOTE — Evaluation (Signed)
 Physical Therapy Evaluation Patient Details Name: Jeffrey [name] MRN: [id number] DOB: November 06, 1955 Today's Date: 10/04/2023  History of Present Illness  Jeffrey [name] is a 68 y.o. male admitted 10/02/23 following mechanical fall. Imaging demonstrated left periprosthetic distal femur fracture. Pt s/p Lt ORIF 7/29. PMHx: CAD, PAD, T2DM, HLD, neuropathy, and L TKA (05/12/23).   Clinical Impression  Pt admitted with above diagnosis. 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 He lives with his wife in a one story house with 3 STE. Pt currently with functional limitations due to the deficits listed below (see PT Problem List). He performed bed mobility with supervision, transfers using RW with CGA, and gait using RW with CGA-supervision. Pt is currently limited by weight bearing status of LLE TDWB and pain. He required increased time to complete functional mobility. Pt ambulated within the room using a hopping technique with heavy reliance on BUE support in order to advance RLE while maintaining LLE NWB. Pt will benefit from acute skilled PT to increase his independence and safety with mobility to allow discharge. Recommend HHPT to increase ROM/strength, decrease pain, improve balance, decrease fall risk, and optimize safety within the home environment.    If plan is discharge home, recommend the following: A little help with walking and/or transfers;A little help with bathing/dressing/bathroom;Assistance with cooking/housework;Assist for transportation;Help with stairs or ramp for entrance   Can travel by private vehicle        Equipment Recommendations None recommended by PT (Pt already has DME)  Recommendations for Other Services       Functional Status Assessment Patient has had a recent decline in their functional status and demonstrates the ability to make significant improvements in function in a reasonable and predictable amount of time.      Precautions / Restrictions Precautions Precautions: Fall Recall of Precautions/Restrictions: Intact Precaution/Restrictions Comments: Educated pt on weight bearing status. He was able to abide by restrictions throughout session. Restrictions Weight Bearing Restrictions Per Provider Order: Yes LLE Weight Bearing Per Provider Order: Touchdown weight bearing      Mobility  Bed Mobility Overal bed mobility: Needs Assistance Bed Mobility: Supine to Sit, Sit to Supine     Supine to sit: HOB elevated, Supervision Sit to supine: HOB elevated, Supervision   General bed mobility comments: Pt sat up on L side of bed with increased time. He managed LLE with BUE support. Pt scooted fwd/bkwd well. Able to reposition himself in the center of the bed.    Transfers Overall transfer level: Needs assistance Equipment used: Rolling walker (2 wheels) Transfers: Sit to/from Stand Sit to Stand: From elevated surface, Contact guard assist           General transfer comment: Pt stood from a raised bed height. He demonstrated proper hand placement using RW. Powered up with CGA. Pt took increased time to stabilize. Cued increased WBing through BUE support on RW. He opted to maintain LLE NWB. Good eccentric control with sitting.    Ambulation/Gait Ambulation/Gait assistance: Contact guard assist, Supervision Gait Distance (Feet): 30 Feet Assistive device: Rolling walker (2 wheels) Gait Pattern/deviations: Step-to pattern, Decreased step length - right, Decreased stride length Gait velocity: decreased Gait velocity interpretation: <1.31 ft/sec, indicative of household ambulator   General Gait Details: Educated pt on gait mechanics while maintaining LLE TDWB. Pt opted to keep LLE NWB instead. He utilized a Company secretary advancing RLE by increasing WBing through BUE support on RW. Pt maintained upright posture and  manuevered within room well.  Stairs            Wheelchair Mobility      Tilt Bed    Modified Rankin (Stroke Patients Only)       Balance Overall balance assessment: Mild deficits observed, not formally tested                                           Pertinent Vitals/Pain Pain Assessment Pain Assessment: 0-10 Pain Score: 5  Pain Location: LLE Pain Descriptors / Indicators: Grimacing, Guarding, Aching, Discomfort Pain Intervention(s): Monitored during session, Limited activity within patient's tolerance, Repositioned    Home Living Family/patient expects to be discharged to:: Private residence Living Arrangements: Spouse/significant other Available Help at Discharge: Family;Available 24 hours/day Type of Home: House Home Access: Stairs to enter Entrance Stairs-Rails: Right;Left;Can reach both Entrance Stairs-Number of Steps: 3   Home Layout: One level Home Equipment: Agricultural consultant (2 wheels);Cane - single point;Shower seat - built in;Grab bars - toilet;Grab bars - tub/shower;Lift chair;Wheelchair - manual;BSC/3in1      Prior Function Prior Level of Function : Independent/Modified Independent;Driving             Mobility Comments: Pt reports recently getting off his SPC following TKA. He has been ambulating without AD. 1 fall leading to current admission. ADLs Comments: Indep with ADLs/IADLs. 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 He completes handy tasks as needed with increased time and take breaks as needed.     Extremity/Trunk Assessment   Upper Extremity Assessment Upper Extremity Assessment: Overall WFL for tasks assessed;Right hand dominant    Lower Extremity Assessment Lower Extremity Assessment: LLE deficits/detail;RLE deficits/detail RLE Deficits / Details: AROM WFL. Hip and Knee strength is grossly 4/5. Ankle DF 3+/5 and PF 4/5. RLE Sensation: history of peripheral neuropathy RLE Coordination: WNL LLE Deficits / Details: Limited hip/knee AROM d/t pain. Pt guarded  during attempt of hip/knee PROM. Ankle ROM/strength WFL. LLE: Unable to fully assess due to pain LLE Sensation: history of peripheral neuropathy LLE Coordination: decreased gross motor    Cervical / Trunk Assessment Cervical / Trunk Assessment: Normal  Communication   Communication Communication: No apparent difficulties    Cognition Arousal: Alert Behavior During Therapy: WFL for tasks assessed/performed   PT - Cognitive impairments: No apparent impairments                       PT - Cognition Comments: Pt A,Ox4 Following commands: Intact       Cueing Cueing Techniques: Verbal cues     General Comments General comments (skin integrity, edema, etc.): LLE edema    Exercises     Assessment/Plan    PT Assessment Patient needs continued PT services  PT Problem List Decreased strength;Decreased range of motion;Decreased activity tolerance;Decreased balance;Decreased mobility       PT Treatment Interventions DME instruction;Gait training;Stair training;Functional mobility training;Therapeutic activities;Therapeutic exercise;Balance training;Patient/family education    PT Goals (Current goals can be found in the Care Plan section)  Acute Rehab PT Goals Patient Stated Goal: Return Home and recover without another leg injury. PT Goal Formulation: With patient Time For Goal Achievement: 10/19/23 Potential to Achieve Goals: Good    Frequency Min 2X/week     Co-evaluation  AM-PAC PT 6 Clicks Mobility  Outcome Measure Help needed turning from your back to your side while in a flat bed without using bedrails?: A Little Help needed moving from lying on your back to sitting on the side of a flat bed without using bedrails?: A Little Help needed moving to and from a bed to a chair (including a wheelchair)?: A Little Help needed standing up from a chair using your arms (e.g., wheelchair or bedside chair)?: A Little Help needed to walk in  hospital room?: A Little Help needed climbing 3-5 steps with a railing? : A Lot 6 Click Score: 17    End of Session Equipment Utilized During Treatment: Gait belt Activity Tolerance: Patient tolerated treatment well Patient left: in bed;with call bell/phone within reach;with bed alarm set Nurse Communication: Mobility status;Patient requests pain meds PT Visit Diagnosis: Difficulty in walking, not elsewhere classified (R26.2);Other abnormalities of gait and mobility (R26.89)    Time: 9256-9184 PT Time Calculation (min) (ACUTE ONLY): 32 min   Charges:   PT Evaluation $PT Eval Moderate Complexity: 1 Mod PT Treatments $Gait Training: 8-22 mins PT General Charges $$ ACUTE PT VISIT: 1 Visit         Randall SAUNDERS, PT, DPT Acute Rehabilitation Services Office: [phone] Secure Chat Preferred  Jeffrey [name] 10/04/2023, 8:25 AM

## 2023-10-04 NOTE — TOC CAGE-AID Note (Signed)
 Transition of Care [facility]) - CAGE-AID Screening  Patient Details  Name: Jeffrey [name] MRN: [id number] Date of Birth: [birth year]  Clinical Narrative:  Patient denies any current alcohol or drug use, substance abuse resources not provided at this time.  CAGE-AID Screening:   Have You Ever Felt You Ought to Cut Down on Your Drinking or Drug Use?: No Have People Annoyed You By Critizing Your Drinking Or Drug Use?: No Have You Felt Bad Or Guilty About Your Drinking Or Drug Use?: No Have You Ever Had a Drink or Used Drugs First Thing In The Morning to Steady Your Nerves or to Get Rid of a Hangover?: No CAGE-AID Score: 0  Substance Abuse Education Offered: No

## 2023-10-05 LAB — CBC
HCT: 34.7 % — ABNORMAL LOW (ref 39.0–52.0)
Hemoglobin: 12 g/dL — ABNORMAL LOW (ref 13.0–17.0)
MCH: 31.4 pg (ref 26.0–34.0)
MCHC: 34.6 g/dL (ref 30.0–36.0)
MCV: 90.8 fL (ref 80.0–100.0)
Platelets: 196 K/uL (ref 150–400)
RBC: 3.82 MIL/uL — ABNORMAL LOW (ref 4.22–5.81)
RDW: 14.2 % (ref 11.5–15.5)
WBC: 10.8 K/uL — ABNORMAL HIGH (ref 4.0–10.5)
nRBC: 0 % (ref 0.0–0.2)

## 2023-10-05 NOTE — Progress Notes (Signed)
 Physical Therapy Treatment Patient Details Name: Jeffrey [name] MRN: [id number] DOB: [birth year] Today's Date: 10/05/2023   History of Present Illness Jeffrey [name] is a 68 y.o. male admitted 10/02/23 following mechanical fall. Imaging demonstrated left periprosthetic distal femur fracture. Pt s/p Lt ORIF 7/29. PMHx: CAD, PAD, T2DM, HLD, neuropathy, and L TKA (05/12/23).    PT Comments  Today's session focused on gait training and stair training. Pt increased gait distance ambulating ~244ft using RW with close supervision. He required intermittent cues to adhere to weight bearing precautions, appearing to accept greater than touch down on LLE. Pt corrected immediately and relied on BUE support on RW to offload LEs. Educated pt on how to ascend/descend steps in accordance with his home set-up. He completed the backwards approach of stairs using RW with minA to stabilize and assist in sequencing. Pt reported his other entrance steps are wide enough to accommodate the RW. He performed forwards steps ups with BUE support on RW and CGA for safety. Will continue to follow acutely and advance appropriately.    If plan is discharge home, recommend the following: A little help with walking and/or transfers;A little help with bathing/dressing/bathroom;Assistance with cooking/housework;Assist for transportation;Help with stairs or ramp for entrance   Can travel by private vehicle        Equipment Recommendations  None recommended by PT    Recommendations for Other Services       Precautions / Restrictions Precautions Precautions: Fall Recall of Precautions/Restrictions: Intact Restrictions Weight Bearing Restrictions Per Provider Order: Yes LLE Weight Bearing Per Provider Order: Touchdown weight bearing     Mobility  Bed Mobility Overal bed mobility: Needs Assistance Bed Mobility: Supine to Sit, Sit to Supine     Supine to sit: HOB elevated, Supervision Sit to supine: HOB elevated,  Supervision   General bed mobility comments: Supervision for safety.    Transfers Overall transfer level: Needs assistance Equipment used: Rolling walker (2 wheels) Transfers: Sit to/from Stand, Bed to chair/wheelchair/BSC Sit to Stand: Contact guard assist, Supervision   Step pivot transfers: Contact guard assist, Supervision       General transfer comment: Pt stood from bed and recliner chair. He demonstrated proper hand placement using RW. Powered up without physical assist. Close supervision-CGA for safety. Transferred to/from recliner chair. Good eccentric control.    Ambulation/Gait Ambulation/Gait assistance: Supervision Gait Distance (Feet): 250 Feet Assistive device: Rolling walker (2 wheels) Gait Pattern/deviations: Step-to pattern, Decreased step length - right, Decreased stride length, Decreased weight shift to left Gait velocity: decreased Gait velocity interpretation: <1.8 ft/sec, indicate of risk for recurrent falls   General Gait Details: Pt heavily relied on BUE support on RW to advance RLE. Pt allowing toe touch on LLE but intermittently appearing to accept more weight. Cues to maintain WBing status. He navigated room/hallway well. Good upright posture, proper proximity to RW, and adequate foot clearence. Pt achieved Rt foot flat contact.   Stairs Stairs: Yes Stairs assistance: Contact guard assist, Min assist Stair Management: No rails, With walker, Backwards, Forwards, Step to pattern Number of Stairs: 2 (x3) General stair comments: Educated pt on how to ascend/descend stairs using various techniques in accordance with his home set up. Demonstrated backwards RW, forwards with rails, and forwards with RW where the step is wide enough to accept AD. He performed steps using RW. Cues for sequencing. CGA-minA to reposition RW across steps and maintain pt's balance.   Wheelchair Mobility     Tilt Bed    Modified  Rankin (Stroke Patients Only)       Balance  Overall balance assessment: Mild deficits observed, not formally tested                                          Communication Communication Communication: No apparent difficulties  Cognition Arousal: Alert Behavior During Therapy: WFL for tasks assessed/performed   PT - Cognitive impairments: No apparent impairments                         Following commands: Intact      Cueing Cueing Techniques: Verbal cues, Visual cues  Exercises      General Comments        Pertinent Vitals/Pain Pain Assessment Pain Assessment: Faces Faces Pain Scale: Hurts little more Pain Location: LLE Pain Descriptors / Indicators: Guarding, Discomfort, Aching Pain Intervention(s): Premedicated before session, Monitored during session, Limited activity within patient's tolerance    Home Living                          Prior Function            PT Goals (current goals can now be found in the care plan section) Acute Rehab PT Goals Patient Stated Goal: Regain independence Progress towards PT goals: Progressing toward goals    Frequency    Min 2X/week      PT Plan      Co-evaluation              AM-PAC PT 6 Clicks Mobility   Outcome Measure  Help needed turning from your back to your side while in a flat bed without using bedrails?: A Little Help needed moving from lying on your back to sitting on the side of a flat bed without using bedrails?: A Little Help needed moving to and from a bed to a chair (including a wheelchair)?: A Little Help needed standing up from a chair using your arms (e.g., wheelchair or bedside chair)?: A Little Help needed to walk in hospital room?: A Little Help needed climbing 3-5 steps with a railing? : A Little 6 Click Score: 18    End of Session Equipment Utilized During Treatment: Gait belt Activity Tolerance: Patient tolerated treatment well Patient left: in bed;with call bell/phone within reach Nurse  Communication: Mobility status PT Visit Diagnosis: Difficulty in walking, not elsewhere classified (R26.2);Other abnormalities of gait and mobility (R26.89)     Time: 9052-8974 PT Time Calculation (min) (ACUTE ONLY): 38 min  Charges:    $Gait Training: 38-52 mins PT General Charges $$ ACUTE PT VISIT: 1 Visit                     Randall SAUNDERS, PT, DPT Acute Rehabilitation Services Office: [phone] Secure Chat Preferred  Delon CHRISTELLA Callander 10/05/2023, 12:11 PM

## 2023-10-05 NOTE — TOC Initial Note (Signed)
 Transition of Care [facility]) - Initial/Assessment Note    Patient Details  Name: Jeffrey [name] MRN: [id number] Date of Birth: [birth year]  [facility]) CM/SW Contact:    Lendia Dais, LCSW Phone Number: 10/05/2023, 11:07 AM  Clinical Narrative: CSW spoke to pt about PT recs for [facility]. Pt was agreeable to dc'ing home with [facility] preferred provider is the [place].   Pt is from home with wife Kristie and has no current services. CSW received permission to contact the wife.   Pt reports current DME in the home; cane, walker, and lift chair.   RN CM was informed about HH rec.                   Expected Discharge Plan: Home w Home Health Services Barriers to Discharge: Continued Medical Work up   Patient Goals and CMS Choice Patient states their goals for this hospitalization and ongoing recovery are:: Walking straight      ownership interest in [facility].provided to:: Patient ([facility])    Expected Discharge Plan and Services In-house Referral: Clinical Social Work   Post Acute Care Choice: Home Health Living arrangements for the past 2 months: Single Family Home                                      Prior Living Arrangements/Services Living arrangements for the past 2 months: Single Family Home Lives with:: Spouse Patient language and need for interpreter reviewed:: Yes Do you feel safe going back to the place where you live?: Yes      Need for Family Participation in Patient Care: Yes (Comment) Care giver support system in place?: Yes (comment) Current home services: DME, Other (comment) (none) Criminal Activity/Legal Involvement Pertinent to Current Situation/Hospitalization: No - Comment as needed  Activities of Daily Living   ADL Screening (condition at time of admission) Independently performs ADLs?: No Does the patient have a NEW difficulty with bathing/dressing/toileting/self-feeding that is expected to last >3 days?: Yes  (Initiates electronic notice to provider for possible OT consult) Does the patient have a NEW difficulty with getting in/out of bed, walking, or climbing stairs that is expected to last >3 days?: Yes (Initiates electronic notice to provider for possible PT consult) Does the patient have a NEW difficulty with communication that is expected to last >3 days?: No Is the patient deaf or have difficulty hearing?: No Does the patient have difficulty seeing, even when wearing glasses/contacts?: No Does the patient have difficulty concentrating, remembering, or making decisions?: No  Permission Sought/Granted Permission sought to share information with : Family Supports Permission granted to share information with : Yes, Verbal Permission Granted  Share Information with NAME: Uno,Kristie R (Spouse)  [phone]           Emotional Assessment Appearance:: Appears stated age Attitude/Demeanor/Rapport: Engaged Affect (typically observed): Pleasant, Appropriate Orientation: : Oriented to Self, Oriented to Place, Oriented to  Time, Oriented to Situation Alcohol / Substance Use: Not Applicable Psych Involvement: No (comment)  Admission diagnosis:  Closed left femoral fracture (HCC) [S72.92XA] Patient Active Problem List   Diagnosis Date Noted   Periprosthetic fracture around internal prosthetic left knee joint 10/02/2023   Supracondylar fracture of left femur (HCC) 10/02/2023   Closed left femoral fracture (HCC) 10/02/2023   Hypokalemia 10/02/2023   Status post total left knee replacement 05/12/2023   Post-traumatic osteoarthritis of  left knee 01/31/2023   Atrial fibrillation (HCC) 11/26/2021   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 [place] [address] Phone: [phone] Fax:  [phone]  [facility] - [place], [place] - [address] [place] [address] Phone: [phone] Fax: [phone]     Social Drivers of Health (SDOH) Social History: SDOH Screenings   Food Insecurity: No Food Insecurity (10/02/2023)  Housing: Low Risk  (10/02/2023)  Transportation Needs: No Transportation Needs (10/02/2023)  Utilities: Not At Risk (10/02/2023)  Social Connections: Socially Integrated (10/02/2023)  Tobacco Use: Medium Risk (10/03/2023)   SDOH Interventions:     Readmission Risk Interventions     No data to display

## 2023-10-05 NOTE — Progress Notes (Signed)
 PROGRESS NOTE    Jeffrey [name]  FMW:[id number] DOB: 1955/10/14 DOA: 10/02/2023 PCP: [facility]   Brief Narrative:  This 68 yrs old Male with PMH significant of CAD, PAD, type II DM, HLD, Neuropathy presents to the hospital S/P fall. Patient has prior history of left total knee arthroplasty. Over the weekend had a fall which was a mechanical [place]. He went to see the orthopedic in the clinic as there was increasing pain and swelling and was referred to ED for admission as he was found to have periprosthetic fracture of the left knee joint.  Status post ORIF.  POD #2.  Assessment & Plan:   Principal Problem:   Closed left femoral fracture (HCC) Active Problems:   Atrial fibrillation (HCC)   Post-traumatic osteoarthritis of left knee   Hypokalemia  Distal femur periprosthetic fracture: Sustained after mechanical fall. Orthopedic consulted. He underwent successful ORIF on 7/29. POD # 2 Continue Pain medication, DVT prophylaxis and weightbearing per orthopedics. Patient reports having significant pain, wants to participate in therapy more before discharge. PT and OT recommended home health services.  Paroxysmal atrial fibrillation: Heart rate is controlled. Continue metoprolol  and Eliquis .     Hypokalemia: Replaced. Continue to monitor   CAD. Carotid artery stenosis No recent angina symptoms. He reports that he had a stress test as well as cath few years ago. Follows up with cardiology on a regular basis.   Stable.  Continue home regimen.   Leukocytosis:  Probably secondary to inflammation.  Continue to monitor   Type 2 diabetes mellitus, well-controlled. Without long-term insulin  use. With neuropathy.  On Jardiance . Continue.sliding scale    DVT prophylaxis: Eliquis  Code Status: Full code Family Communication: No family at bed side Disposition Plan:    Status is: Inpatient Remains inpatient appropriate because:  Awaiting  SNF placement    Consultants:  Orthopaedics  Procedures: S/P ORIF  Antimicrobials:  Anti-infectives (From admission, onward)    Start     Dose/Rate Route Frequency Ordered Stop   10/03/23 2200  ceFAZolin  (ANCEF ) IVPB 2g/100 mL premix        2 g 200 mL/hr over 30 Minutes Intravenous Every 8 hours 10/03/23 1955 10/04/23 1638   10/03/23 1400  ceFAZolin  (ANCEF ) IVPB 2g/100 mL premix        2 g 200 mL/hr over 30 Minutes Intravenous On call to O.R. 10/03/23 1007 10/03/23 1625       Subjective: Patient was seen and examined at bedside.  Overnight events noted. Patient is status post ORIF POD #2.  Patient reports having pain in the left knee area.  Objective: Vitals:   10/04/23 2000 10/05/23 0504 10/05/23 0750 10/05/23 0900  BP: (!) 108/55 113/63 (!) 116/47 (!) 107/57  Pulse: (!) 59 (!) 56 60   Resp: 17 15 18    Temp: 98.2 F (36.8 C) 97.7 F (36.5 C) 97.8 F (36.6 C)   TempSrc: Oral Oral    SpO2: 95% 96% 97%   Weight:      Height:        Intake/Output Summary (Last 24 hours) at 10/05/2023 1204 Last data filed at 10/04/2023 1500 Gross per 24 hour  Intake 300 ml  Output --  Net 300 ml   Filed Weights   10/02/23 1652 10/02/23 2018  Weight: 103 kg 98.4 kg    Examination:  General exam: Appears calm and comfortable , not in any acute distress. Respiratory system: CTA Bilaterally.  Respiratory effort normal.  RR 16 Cardiovascular system: S1 & S2 heard, RRR. No JVD, murmurs, rubs, gallops or clicks.  Gastrointestinal system: Abdomen is non distended, soft and non tender.  Normal bowel sounds heard. Central nervous system: Alert and oriented x 3. No focal neurological deficits. Extremities: Status post ORIF left knee tenderness. Skin: No rashes, lesions or ulcers Psychiatry: Judgement and insight appear normal. Mood & affect appropriate.     Data Reviewed: I have personally reviewed following labs and imaging studies  CBC: Recent Labs  Lab 10/02/23 1736  10/03/23 0604 10/04/23 0546 10/05/23 0627  WBC 13.7* 9.6 12.0* 10.8*  HGB 14.2 13.2 12.4* 12.0*  HCT 41.3 38.2* 36.9* 34.7*  MCV 91.0 91.2 92.0 90.8  PLT 206 188 199 196   Basic Metabolic Panel: Recent Labs  Lab 10/02/23 1736 10/03/23 0604  NA 135 139  K 3.0* 3.9  CL 102 105  CO2 22 23  GLUCOSE 215* 219*  BUN 21 18  CREATININE 1.08 0.86  CALCIUM  8.8* 8.8*   GFR: Estimated Creatinine Clearance: 98.7 mL/min (by C-G formula based on SCr of 0.86 mg/dL). Liver Function Tests: Recent Labs  Lab 10/03/23 0604  AST 18  ALT 22  ALKPHOS 105  BILITOT 1.0  PROT 6.5  ALBUMIN 3.3*   No results for input(s): LIPASE, AMYLASE in the last 168 hours. No results for input(s): AMMONIA in the last 168 hours. Coagulation Profile: No results for input(s): INR, PROTIME in the last 168 hours. Cardiac Enzymes: No results for input(s): CKTOTAL, CKMB, CKMBINDEX, TROPONINI in the last 168 hours. BNP (last 3 results) No results for input(s): PROBNP in the last 8760 hours. HbA1C: No results for input(s): HGBA1C in the last 72 hours. CBG: Recent Labs  Lab 10/03/23 1346 10/03/23 1849  GLUCAP 163* 180*   Lipid Profile: No results for input(s): CHOL, HDL, LDLCALC, TRIG, CHOLHDL, LDLDIRECT in the last 72 hours. Thyroid  Function Tests: No results for input(s): TSH, T4TOTAL, FREET4, T3FREE, THYROIDAB in the last 72 hours. Anemia Panel: No results for input(s): VITAMINB12, FOLATE, FERRITIN, TIBC, IRON, RETICCTPCT in the last 72 hours. Sepsis Labs: No results for input(s): PROCALCITON, LATICACIDVEN in the last 168 hours.  Recent Results (from the past 240 hours)  Surgical PCR screen     Status: None   Collection Time: 10/02/23  8:35 PM   Specimen: Nasal Mucosa; Nasal Swab  Result Value Ref Range Status   MRSA, PCR NEGATIVE NEGATIVE Final   Staphylococcus aureus NEGATIVE NEGATIVE Final    Comment: (NOTE) The Xpert SA Assay (FDA  approved for NASAL specimens in patients 9 years of age and older), is one component of a comprehensive surveillance program. It is not intended to diagnose infection nor to guide or monitor treatment. Performed at [facility], [address] [place], [place] [address]     Radiology Studies: DG Knee Left Port Result Date: 10/03/2023 CLINICAL DATA:  Status post ORIF distal left femoral fracture EXAM: PORTABLE LEFT KNEE - 2 VIEW COMPARISON:  Films from earlier in the same day. FINDINGS: New fixation sideplate is noted along the lateral aspect of the distal femur. Fracture fragments are in near anatomic alignment. Prior left knee replacement is noted. Prior medullary rod is again seen. IMPRESSION: Status post ORIF distal left femoral fracture. Electronically Signed   By: Oneil Devonshire M.D.   On: 10/03/2023 20:29   DG FEMUR MIN 2 VIEWS LEFT Result Date: 10/03/2023 CLINICAL DATA:  Known distal left femoral fracture EXAM: LEFT FEMUR 2 VIEWS COMPARISON:  None Available. FLUOROSCOPY TIME:  Radiation Exposure Index (as provided by the fluoroscopic device): 8.27 mGy If the device does not provide the exposure index: Fluoroscopy Time:  1 minute 19 seconds Number of Acquired Images:  16 FINDINGS: Initial images again demonstrate the known femoral rod in satisfactory position. Left knee prosthesis is again seen. Transverse fracture through the distal femoral metaphysis is again seen. Fracture fragments have been predominately reduced. Fixation wires were placed across the fracture or further reduction. Distal fixation screw femoral rod were then removed. Fixation sideplate with multiple fixation screws was then placed along the lateral aspect of the femur. Multiple fixation screws are noted. Fracture fragments are in near anatomic alignment. IMPRESSION: Status post ORIF of distal left femoral fracture Electronically Signed   By: Oneil Devonshire M.D.   On: 10/03/2023 19:06   DG C-Arm 1-60 Min-No Report Result  Date: 10/03/2023 Fluoroscopy was utilized by the requesting physician.  No radiographic interpretation.   DG C-Arm 1-60 Min-No Report Result Date: 10/03/2023 Fluoroscopy was utilized by the requesting physician.  No radiographic interpretation.   DG C-Arm 1-60 Min-No Report Result Date: 10/03/2023 Fluoroscopy was utilized by the requesting physician.  No radiographic interpretation.   Scheduled Meds:  allopurinol   100 mg Oral Daily   ascorbic acid   500 mg Oral Daily   atorvastatin   80 mg Oral QHS   cholecalciferol   2,000 Units Oral BID   citalopram   10 mg Oral q AM   gabapentin   300 mg Oral Daily   And   gabapentin   600 mg Oral QHS   metoprolol  tartrate  25 mg Oral BID   mupirocin  ointment  1 Application Nasal BID   ranolazine   500 mg Oral BID   zinc  sulfate (50mg  elemental zinc )  220 mg Oral Daily   Continuous Infusions:     LOS: 3 days    Time spent: 35 mins    Darcel Dawley, MD [facility]   If 7PM-7AM, please contact night-coverage

## 2023-10-06 MED ORDER — METHOCARBAMOL 500 MG PO TABS: 500.0000 mg | ORAL_TABLET | Freq: Four times a day (QID) | ORAL | 0 refills | Status: AC

## 2023-10-06 MED ORDER — VITAMIN D3 25 MCG PO TABS: 2000.0000 [IU] | ORAL_TABLET | Freq: Two times a day (BID) | ORAL | 0 refills | Status: AC

## 2023-10-06 MED ORDER — OXYCODONE-ACETAMINOPHEN 7.5-325 MG PO TABS: 1.0000 | ORAL_TABLET | ORAL | 0 refills | Status: DC | PRN

## 2023-10-06 MED ORDER — DOCUSATE SODIUM 100 MG PO CAPS
100.0000 mg | ORAL_CAPSULE | Freq: Every day | ORAL | 2 refills | Status: AC | PRN
Start: 2023-10-06 — End: 2024-10-05

## 2023-10-06 MED ORDER — ORAL CARE MOUTH RINSE: 15.0000 mL | OROMUCOSAL | Status: DC | PRN

## 2023-10-06 NOTE — TOC Transition Note (Addendum)
 Transition of Care [facility]) - Discharge Note   Patient Details  Name: Jeffrey [name] MRN: [id number] Date of Birth: 1955-05-30  Transition of Care [facility]) CM/SW Contact:  Rosalva Jon Bloch, RN Phone Number: 10/06/2023, 11:12 AM   Clinical Narrative:    Patient will DC to: home Anticipated DC date: 10/06/2023 Family notified: no Transport by: car      -  s/p ORIF DISTAL FEMUR FRACTURE (Left) ,7/30 Per MD patient ready for DC today. RN, patient, and patient's wife notified of DC.  Pt agreeable to home health services. Pt without preference. Referral made with [facility] and accepted. Pt without DME needs. Pt without RX med concerns. Post hospital f/u noted on AVS. Transportation to home will be provided by wife. RNCM will sign off for now as intervention is no longer needed. Please consult us  again if new needs arise.    Final next level of care: Home w Home Health Services Barriers to Discharge: No Barriers Identified   Patient Goals and CMS Choice Patient states their goals for this hospitalization and ongoing recovery are:: Walking straight   Choice offered to / list presented to : Patient [place] ownership interest in [facility].provided to:: Patient ([facility])    Discharge Placement                       Discharge Plan and Services Additional resources added to the After Visit Summary for   In-house Referral: Clinical Social Work   Post Acute Care Choice: Home Health                    HH Arranged: PT, OT [facility] Agency: [facility] Date [facility] Agency Contacted: 10/06/23 Time HH Agency Contacted: 1110 Representative spoke with at [facility] Agency: [place]   Social Drivers of Health (SDOH) Interventions SDOH Screenings   Food Insecurity: No Food Insecurity (10/02/2023)  Housing: Low Risk  (10/02/2023)  Transportation Needs: No Transportation Needs (10/02/2023)  Utilities: Not At Risk (10/02/2023)  Social Connections: Socially Integrated  (10/02/2023)  Tobacco Use: Medium Risk (10/03/2023)     Readmission Risk Interventions     No data to display

## 2023-10-06 NOTE — Progress Notes (Signed)
 PT Cancellation Note  Patient Details Name: Jeffrey [name] MRN: [id number] DOB: 1955-09-09   Cancelled Treatment:    Reason Eval/Treat Not Completed: (P) Patient declined, no reason specified, pt declining mobility stating he had just completed walk with MT and was resting prior to D/C. Pt without questions or concerns. Will check back as schedule allows to continue with PT POC.  Therisa SAUNDERS. PTA Acute Rehabilitation Services Office: [phone]    Therisa CHRISTELLA Boor 10/06/2023, 11:10 AM

## 2023-10-06 NOTE — Plan of Care (Signed)

## 2023-10-06 NOTE — Discharge Summary (Signed)
 Physician Discharge Summary  Jeffrey [name] FMW:[id number] DOB: 07-Aug-1955 DOA: 10/02/2023  PCP: [facility]  Admit date: 10/02/2023  Discharge date: 10/06/2023  Admitted From: Home.  Disposition: Home health Services.  Recommendations for Outpatient Follow-up:  Follow up with PCP in 1-2 weeks. Please obtain BMP/CBC in one week. Advised to follow-up with orthopedics Dr. Celena Sharper in 1 week.  Home Health: Home PT/OT Equipment/Devices:None  Discharge Condition: Stable CODE STATUS:Full code Diet recommendation: Heart Healthy   Brief [facility] Course: This 68 yrs old Male with PMH significant of CAD, PAD, type II DM, HLD, Neuropathy presents to the hospital S/P fall. Patient has prior history of left total knee arthroplasty. Over the weekend had a fall which was a mechanical fall [place]. He went to see the orthopedic in the clinic as there was increasing pain and swelling and was referred to ED for admission as he was found to have periprosthetic fracture of the left knee joint.  Status post ORIF.  POD #3.  Patient has made significant improvement.  Pain has been reasonably controlled.  PT and OT recommended home health services.  Ortho signed off.  Patient being discharged home.   Discharge Diagnoses:  Principal Problem:   Closed left femoral fracture (HCC) Active Problems:   Atrial fibrillation (HCC)   Post-traumatic osteoarthritis of left knee   Hypokalemia  Distal femur periprosthetic fracture: Sustained after mechanical fall. Orthopedic consulted. He underwent successful ORIF on 7/29. POD # 3 Continue Pain medication, DVT prophylaxis and weightbearing per orthopedics. Patient reports having significant pain, wants to participate in therapy more before discharge. PT and OT recommended home health services.   Paroxysmal atrial fibrillation: Heart rate is controlled. Continue metoprolol  and Eliquis .     Hypokalemia: Replaced.  Continue to monitor   CAD. Carotid artery stenosis No recent angina symptoms. He reports that he had a stress test as well as cath few years ago. Follows up with cardiology on a regular basis.   Stable.  Continue home regimen.   Leukocytosis:  Probably secondary to inflammation.  Continue to monitor   Type 2 diabetes mellitus, well-controlled. Without long-term insulin  use. With neuropathy.  On Jardiance . Continue.sliding scale   Discharge Instructions  Discharge Instructions     Call MD for:  persistant dizziness or light-headedness   Complete by: As directed    Call MD for:  persistant nausea and vomiting   Complete by: As directed    Diet - low sodium heart healthy   Complete by: As directed    Diet Carb Modified   Complete by: As directed    Discharge wound care:   Complete by: As directed    Follow-up orthopedics as scheduled.   Increase activity slowly   Complete by: As directed       Allergies as of 10/06/2023       Reactions   Shellfish-derived Products Other (See Comments)   GI upset- this tears up my stomach        Medication List     STOP taking these medications    HYDROcodone -acetaminophen  5-325 MG tablet Commonly known as: NORCO/VICODIN   oxyCODONE -acetaminophen  5-325 MG tablet Commonly known as: Percocet Replaced by: oxyCODONE -acetaminophen  7.5-325 MG tablet       TAKE these medications    allopurinol  100 MG tablet Commonly known as: ZYLOPRIM  Take 100 mg by mouth daily.   ascorbic acid  500 MG tablet Commonly known as: VITAMIN C  Take 500 mg by mouth daily.  atorvastatin  80 MG tablet Commonly known as: LIPITOR  Take 80 mg by mouth at bedtime.   calcium  carbonate 1500 (600 Ca) MG Tabs tablet Commonly known as: OSCAL Take 1 tablet (1,500 mg total) by mouth daily.   citalopram  20 MG tablet Commonly known as: CELEXA  Take 10 mg by mouth in the morning.   colchicine  0.6 MG tablet Take 0.6 mg by mouth daily as needed (gout  flares).   docusate sodium  100 MG capsule Commonly known as: Colace Take 1 capsule (100 mg total) by mouth daily as needed. What changed: Another medication with the same name was added. Make sure you understand how and when to take each.   docusate sodium  100 MG capsule Commonly known as: Colace Take 1 capsule (100 mg total) by mouth daily as needed. What changed: You were already taking a medication with the same name, and this prescription was added. Make sure you understand how and when to take each.   Eliquis  5 MG Tabs tablet Generic drug: apixaban  Take 5 mg by mouth 2 (two) times daily.   famotidine  20 MG tablet Commonly known as: PEPCID  Take 20 mg by mouth 2 (two) times daily.   furosemide  20 MG tablet Commonly known as: LASIX  Take 20 mg by mouth in the morning.   gabapentin  300 MG capsule Commonly known as: NEURONTIN  Take 300-600 mg by mouth See admin instructions. Take 1 capsule (300 mg) by mouth once daily in the morning & take 2 capsules (600 mg) by mouth at bedtime.   Jardiance  25 MG Tabs tablet Generic drug: empagliflozin  Take 12.5 mg by mouth in the morning.   methocarbamol  750 MG tablet Commonly known as: Robaxin -750 Take 1 tablet (750 mg total) by mouth 3 (three) times daily as needed for muscle spasms. What changed: Another medication with the same name was added. Make sure you understand how and when to take each.   methocarbamol  500 MG tablet Commonly known as: ROBAXIN  Take 1 tablet (500 mg total) by mouth 4 (four) times daily for 10 days. What changed: You were already taking a medication with the same name, and this prescription was added. Make sure you understand how and when to take each.   metoprolol  tartrate 50 MG tablet Commonly known as: LOPRESSOR  Take 25 mg by mouth 2 (two) times daily.   nitroGLYCERIN  0.4 MG SL tablet Commonly known as: NITROSTAT  Place 0.4 mg under the tongue every 5 (five) minutes x 3 doses as needed for chest pain.    ondansetron  4 MG tablet Commonly known as: Zofran  Take 1 tablet (4 mg total) by mouth every 8 (eight) hours as needed for nausea or vomiting.   oxyCODONE -acetaminophen  7.5-325 MG tablet Commonly known as: Percocet Take 1 tablet by mouth every 4 (four) hours as needed for up to 5 days for severe pain (pain score 7-10). Replaces: oxyCODONE -acetaminophen  5-325 MG tablet   PreserVision AREDS 2 Caps Chew 1 capsule by mouth in the morning and at bedtime.   ranolazine  500 MG 12 hr tablet Commonly known as: RANEXA  Take 500 mg by mouth 2 (two) times daily.   vitamin D3 25 MCG tablet Commonly known as: CHOLECALCIFEROL  Take 2 tablets (2,000 Units total) by mouth 2 (two) times daily.   zinc  sulfate (50mg  elemental zinc ) 220 (50 Zn) MG capsule Take 220 mg by mouth daily.               Discharge Care Instructions  (From admission, onward)  Start     Ordered   10/06/23 0000  Discharge wound care:       Comments: Follow-up orthopedics as scheduled.   10/06/23 1002            Follow-up Information     [facility] Follow up in 1 week(s).   Contact information: [address] [facility] [place] [address] [phone]         Celena Sharper, MD Follow up in 10 day(s).   Specialty: Orthopedic Surgery Contact information: [address] [place] [address] [phone]         [facility] Follow up.   Specialty: Home Health Services Why: home health PT and OT services will be provided by [facility] Contact information: [address] [place] [address] [phone]                Allergies  Allergen Reactions   Shellfish-Derived Products Other (See Comments)    GI upset- this tears up my stomach    Consultations: Orthopedics  Procedures/Studies: DG Knee Left Port Result Date: 10/03/2023 CLINICAL DATA:  Status post ORIF distal left femoral fracture EXAM: PORTABLE LEFT  KNEE - 2 VIEW COMPARISON:  Films from earlier in the same day. FINDINGS: New fixation sideplate is noted along the lateral aspect of the distal femur. Fracture fragments are in near anatomic alignment. Prior left knee replacement is noted. Prior medullary rod is again seen. IMPRESSION: Status post ORIF distal left femoral fracture. Electronically Signed   By: Oneil Devonshire M.D.   On: 10/03/2023 20:29   DG Knee 1-2 Views Left Result Date: 10/03/2023 CLINICAL DATA:  Known distal femoral fracture EXAM: LEFT KNEE - 1-2 VIEW COMPARISON:  06/27/2023 FINDINGS: Medullary rod with distal fixation screws is again identified. A new transverse fracture through the distal metaphysis of the left femur is noted. The left knee replacement is again seen. Mild posterior displacement of the distal fracture fragment is noted. Considerable soft tissue swelling is seen. IMPRESSION: Distal femoral metaphyseal fracture as described. Electronically Signed   By: Oneil Devonshire M.D.   On: 10/03/2023 19:07   DG FEMUR MIN 2 VIEWS LEFT Result Date: 10/03/2023 CLINICAL DATA:  Known distal left femoral fracture EXAM: LEFT FEMUR 2 VIEWS COMPARISON:  None Available. FLUOROSCOPY TIME:  Radiation Exposure Index (as provided by the fluoroscopic device): 8.27 mGy If the device does not provide the exposure index: Fluoroscopy Time:  1 minute 19 seconds Number of Acquired Images:  16 FINDINGS: Initial images again demonstrate the known femoral rod in satisfactory position. Left knee prosthesis is again seen. Transverse fracture through the distal femoral metaphysis is again seen. Fracture fragments have been predominately reduced. Fixation wires were placed across the fracture or further reduction. Distal fixation screw femoral rod were then removed. Fixation sideplate with multiple fixation screws was then placed along the lateral aspect of the femur. Multiple fixation screws are noted. Fracture fragments are in near anatomic alignment. IMPRESSION:  Status post ORIF of distal left femoral fracture Electronically Signed   By: Oneil Devonshire M.D.   On: 10/03/2023 19:06   DG C-Arm 1-60 Min-No Report Result Date: 10/03/2023 Fluoroscopy was utilized by the requesting physician.  No radiographic interpretation.   DG C-Arm 1-60 Min-No Report Result Date: 10/03/2023 Fluoroscopy was utilized by the requesting physician.  No radiographic interpretation.   DG C-Arm 1-60 Min-No Report Result Date: 10/03/2023 Fluoroscopy was utilized by the requesting physician.  No radiographic interpretation.  CT FEMUR LEFT WO CONTRAST Result Date: 10/03/2023 CLINICAL DATA:  Periprosthetic fracture of the distal left femur. Status post total knee arthroplasty. EXAM: CT OF THE LOWER LEFT EXTREMITY WITHOUT CONTRAST TECHNIQUE: Multidetector CT imaging of the lower left extremity was performed according to the standard protocol. RADIATION DOSE REDUCTION: This exam was performed according to the departmental dose-optimization program which includes automated exposure control, adjustment of the mA and/or kV according to patient size and/or use of iterative reconstruction technique. COMPARISON:  Radiographs of the left femur and knee dated 10/02/2023. FINDINGS: Bones/Joint/Cartilage Acute periprosthetic fracture of the distal left femoral metadiaphysis extending to the distal end of the intramedullary nail and distal interlocking screw. Hardware otherwise appears intact. There is approximately 1.6 cm of posterior displacement and 0.9 cm of medial displacement of the distal fracture component. Prior ORIF of a healed left mid to distal diaphyseal fracture. Status post left total knee arthroplasty with normal alignment. Hardware is intact. Distal femoral metadiaphyseal fracture margins extend to the undersurface of the superior anterior component of the femoral prosthesis. Mild osteoarthritis of the left hip. Pubic symphysis is anatomically aligned. Ligaments Ligaments are suboptimally  evaluated by CT. Muscles and Tendons Muscles are normal. No muscle atrophy. No intramuscular fluid collection or hematoma. Soft tissue Soft tissue swelling of the anterior mid to distal thigh. No loculated fluid collection or hematoma. No soft tissue mass. Visualized intrapelvic contents are grossly unremarkable. IMPRESSION: 1. Acute displaced periprosthetic fracture of the distal left femoral metadiaphysis extending to the distal end of the intramedullary nail and distal interlocking screw. 2. Status post left total knee arthroplasty with normal alignment. Distal femoral metadiaphyseal fracture margins extend to the undersurface of the superior anterior component of the femoral prosthesis at the level of the femoral trochlea. Electronically Signed   By: Harrietta Sherry M.D.   On: 10/03/2023 13:16   CT KNEE LEFT WO CONTRAST Result Date: 10/03/2023 CLINICAL DATA:  Periprosthetic fracture of the distal left femur. Status post total knee arthroplasty. EXAM: CT OF THE LOWER LEFT EXTREMITY WITHOUT CONTRAST TECHNIQUE: Multidetector CT imaging of the lower left extremity was performed according to the standard protocol. RADIATION DOSE REDUCTION: This exam was performed according to the departmental dose-optimization program which includes automated exposure control, adjustment of the mA and/or kV according to patient size and/or use of iterative reconstruction technique. COMPARISON:  Radiographs of the left femur and knee dated 10/02/2023. FINDINGS: Bones/Joint/Cartilage Acute periprosthetic fracture of the distal left femoral metadiaphysis extending to the distal end of the intramedullary nail and distal interlocking screw. Hardware otherwise appears intact. There is approximately 1.6 cm of posterior displacement and 0.9 cm of medial displacement of the distal fracture component. Prior ORIF of a healed left mid to distal diaphyseal fracture. Status post left total knee arthroplasty with normal alignment. Hardware is  intact. Distal femoral metadiaphyseal fracture margins extend to the undersurface of the superior anterior component of the femoral prosthesis. Mild osteoarthritis of the left hip. Pubic symphysis is anatomically aligned. Ligaments Ligaments are suboptimally evaluated by CT. Muscles and Tendons Muscles are normal. No muscle atrophy. No intramuscular fluid collection or hematoma. Soft tissue Soft tissue swelling of the anterior mid to distal thigh. No loculated fluid collection or hematoma. No soft tissue mass. Visualized intrapelvic contents are grossly unremarkable. IMPRESSION: 1. Acute displaced periprosthetic fracture of the distal left femoral metadiaphysis extending to the distal end of the intramedullary nail and distal interlocking screw. 2. Status post left total knee arthroplasty with normal alignment. Distal femoral metadiaphyseal  fracture margins extend to the undersurface of the superior anterior component of the femoral prosthesis at the level of the femoral trochlea. Electronically Signed   By: Harrietta Sherry M.D.   On: 10/03/2023 13:16   DG Femur Min 2 Views Left Result Date: 10/02/2023 CLINICAL DATA:  Suspected femoral fracture EXAM: LEFT FEMUR 2 VIEWS COMPARISON:  Knee radiograph June 27, 2023 FINDINGS: There is a transverse fracture line involving the distal femoral metaphysis about the inferior tip of the intramedullary rod with mild impaction and displacement. The total knee arthroplasty hardware is otherwise in proper position. Mild soft tissue swelling around the knee joint. Proximal part of the femoral rod and intertrochanteric screw is unremarkable. IMPRESSION: Distal femoral metaphyseal fracture. Electronically Signed   By: Megan  Zare M.D.   On: 10/02/2023 18:38   DG Pelvis 1-2 Views Result Date: 10/02/2023 CLINICAL DATA:  Femur fracture. Suspected femur fracture. Leg injury 2 days ago. Femur fracture on outpatient imaging. Unable to bear weight. Multiple previous surgeries. EXAM:  PELVIS - 1-2 VIEW COMPARISON:  CT abdomen and pelvis 05/21/2018 FINDINGS: Postoperative changes with previous internal fixation of the left hip. No acute fracture or dislocation is identified in the pelvis or visualized hips. SI joints and symphysis pubis are not displaced. No focal bone lesions are identified. Degenerative changes in the lower lumbar spine and bilateral hips. Soft tissues are unremarkable. IMPRESSION: Previous internal fixation of the left hip. Degenerative changes in the wall bar spine and hips. No acute fracture or dislocation is identified. Electronically Signed   By: Elsie Gravely M.D.   On: 10/02/2023 18:25   Status post ORIF.   Subjective: Patient was seen and examined at bedside.  Overnight events noted. Patient reports doing much better , still reports having mild pain but much reasonably controlled.   Patient wants to go home. HHS arranged.  Discharge Exam: Vitals:   10/06/23 0435 10/06/23 0748  BP: (!) 116/55 116/60  Pulse: (!) 58 (!) 58  Resp: 16 16  Temp: 97.9 F (36.6 C) 97.7 F (36.5 C)  SpO2: 99% 96%   Vitals:   10/05/23 2002 10/05/23 2023 10/06/23 0435 10/06/23 0748  BP: (!) 142/64 (!) 142/64 (!) 116/55 116/60  Pulse: 65 65 (!) 58 (!) 58  Resp: 18  16 16   Temp: 97.9 F (36.6 C)  97.9 F (36.6 C) 97.7 F (36.5 C)  TempSrc: Oral  Oral   SpO2: 99%  99% 96%  Weight:      Height:        General: Pt is alert, awake, not in acute distress Cardiovascular: RRR, S1/S2 +, no rubs, no gallops Respiratory: CTA bilaterally, no wheezing, no rhonchi Abdominal: Soft, NT, ND, bowel sounds + Extremities: no edema, no cyanosis, s/p Left ORIF.    The results of significant diagnostics from this hospitalization (including imaging, microbiology, ancillary and laboratory) are listed below for reference.     Microbiology: Recent Results (from the past 240 hours)  Surgical PCR screen     Status: None   Collection Time: 10/02/23  8:35 PM   Specimen: Nasal  Mucosa; Nasal Swab  Result Value Ref Range Status   MRSA, PCR NEGATIVE NEGATIVE Final   Staphylococcus aureus NEGATIVE NEGATIVE Final    Comment: (NOTE) The Xpert SA Assay (FDA approved for NASAL specimens in patients 21 years of age and older), is one component of a comprehensive surveillance program. It is not intended to diagnose infection nor to guide or monitor treatment. Performed at [facility]  [facility], [address], [place], [place] [address]      Labs: BNP (last 3 results) No results for input(s): BNP in the last 8760 hours. Basic Metabolic Panel: Recent Labs  Lab 10/02/23 1736 10/03/23 0604  NA 135 139  K 3.0* 3.9  CL 102 105  CO2 22 23  GLUCOSE 215* 219*  BUN 21 18  CREATININE 1.08 0.86  CALCIUM  8.8* 8.8*   Liver Function Tests: Recent Labs  Lab 10/03/23 0604  AST 18  ALT 22  ALKPHOS 105  BILITOT 1.0  PROT 6.5  ALBUMIN 3.3*   No results for input(s): LIPASE, AMYLASE in the last 168 hours. No results for input(s): AMMONIA in the last 168 hours. CBC: Recent Labs  Lab 10/02/23 1736 10/03/23 0604 10/04/23 0546 10/05/23 0627  WBC 13.7* 9.6 12.0* 10.8*  HGB 14.2 13.2 12.4* 12.0*  HCT 41.3 38.2* 36.9* 34.7*  MCV 91.0 91.2 92.0 90.8  PLT 206 188 199 196   Cardiac Enzymes: No results for input(s): CKTOTAL, CKMB, CKMBINDEX, TROPONINI in the last 168 hours. BNP: Invalid input(s): POCBNP CBG: Recent Labs  Lab 10/03/23 1346 10/03/23 1849  GLUCAP 163* 180*   D-Dimer No results for input(s): DDIMER in the last 72 hours. Hgb A1c No results for input(s): HGBA1C in the last 72 hours. Lipid Profile No results for input(s): CHOL, HDL, LDLCALC, TRIG, CHOLHDL, LDLDIRECT in the last 72 hours. Thyroid  function studies No results for input(s): TSH, T4TOTAL, T3FREE, THYROIDAB in the last 72 hours.  Invalid input(s): FREET3 Anemia work up No results for input(s): VITAMINB12, FOLATE, FERRITIN, TIBC,  IRON, RETICCTPCT in the last 72 hours. Urinalysis No results found for: COLORURINE, APPEARANCEUR, LABSPEC, PHURINE, GLUCOSEU, HGBUR, BILIRUBINUR, KETONESUR, PROTEINUR, UROBILINOGEN, NITRITE, LEUKOCYTESUR Sepsis Labs Recent Labs  Lab 10/02/23 1736 10/03/23 0604 10/04/23 0546 10/05/23 0627  WBC 13.7* 9.6 12.0* 10.8*   Microbiology Recent Results (from the past 240 hours)  Surgical PCR screen     Status: None   Collection Time: 10/02/23  8:35 PM   Specimen: Nasal Mucosa; Nasal Swab  Result Value Ref Range Status   MRSA, PCR NEGATIVE NEGATIVE Final   Staphylococcus aureus NEGATIVE NEGATIVE Final    Comment: (NOTE) The Xpert SA Assay (FDA approved for NASAL specimens in patients 8 years of age and older), is one component of a comprehensive surveillance program. It is not intended to diagnose infection nor to guide or monitor treatment. Performed at [facility], [address] [place], [place] [address]      Time coordinating discharge: Over 30 minutes  SIGNED:   Darcel Dawley, MD  [facility] 10/06/2023, 2:21 PM Pager   If 7PM-7AM, please contact night-coverage

## 2023-10-06 NOTE — Progress Notes (Signed)
 Orthopaedic Trauma Service Progress Note  Patient ID: Jeffrey [name] MRN: [id number] DOB/AGE: January 02, 1956 68 y.o.  Subjective:  Doing great  Pain controlled  Ready to go home    ROS As above  Today's  total administered Morphine Milligram Equivalents: 50 Yesterday's total administered Morphine Milligram Equivalents: 125  Objective:   VITALS:   Vitals:   10/05/23 2002 10/05/23 2023 10/06/23 0435 10/06/23 0748  BP: (!) 142/64 (!) 142/64 (!) 116/55 116/60  Pulse: 65 65 (!) 58 (!) 58  Resp: 18  16 16   Temp: 97.9 F (36.6 C)  97.9 F (36.6 C) 97.7 F (36.5 C)  TempSrc: Oral  Oral   SpO2: 99%  99% 96%  Weight:      Height:        Estimated body mass index is 28.63 kg/m as calculated from the following:   Height as of this encounter: 6' 1 (1.854 m).   Weight as of this encounter: 98.4 kg.   Intake/Output      07/31 0701 08/01 0700 08/01 0701 08/02 0700   P.O. 240    IV Piggyback     Total Intake(mL/kg) 240 (2.4)    Net +240         Urine Occurrence 4 x      LABS  No results found for this or any previous visit (from the past 24 hours).   PHYSICAL EXAM:   Gen: sitting up on EOB, NAD, looks good  Lungs: unlabored Ext:       Left Lower Extremity Dressing is clean, dry and intact             Extremity is warm             No DCT             Compartments are soft             No pain out of proportion with passive stretching of his toes or ankle             DPN, SPN, TN sensory functions are intact             EHL, FHL, lesser toe motor functions intact             Ankle flexion, extension, inversion eversion intact             + DP pulse             Good clinical alignment of his left leg is noted    Assessment/Plan: 3 Days Post-Op   Principal Problem:   Closed left femoral fracture (HCC) Active Problems:   Atrial fibrillation (HCC)   Post-traumatic osteoarthritis of left  knee   Hypokalemia   Anti-infectives (From admission, onward)    Start     Dose/Rate Route Frequency Ordered Stop   10/03/23 2200  ceFAZolin  (ANCEF ) IVPB 2g/100 mL premix        2 g 200 mL/hr over 30 Minutes Intravenous Every 8 hours 10/03/23 1955 10/04/23 1638   10/03/23 1400  ceFAZolin  (ANCEF ) IVPB 2g/100 mL premix        2 g 200 mL/hr over 30 Minutes Intravenous On call to O.R. 10/03/23 1007 10/03/23 1625     .  POD/HD#: 95  68 year old male 72-month status post left  total knee arthroplasty with acute left periprosthetic distal femur fracture   - Left periprosthetic distal femur fracture s/p ORIF               TDWB L leg with assistance              ROM as tolerated L knee and hip               Therapy evals              Ice and elevate               Bone foam when not working on ROM               Dressing change on Sunday   Wound care reviewed with pt and wife                No Bracing    PT- please teach HEP for L knee ROM- AROM, PROM. Prone exercises as well. No ROM restrictions.  Quad sets, SLR, LAQ, SAQ, heel slides, stretching,    Ankle theraband program, heel cord stretching, toe towel curls, etc   No pillows under bend of knee when at rest, ok to place under heel to help work on extension. Can also use zero knee bone foam if available DO NOT LET KNEE REST IN FLEXION!!!!!     - Pain management:               Multimodal               Minimize narcotics   - ABL anemia/Hemodynamics               Stable   - Medical issues                Per primary                              Peripheral neuropathy                          Chronic but increases risk of falling    - DVT/PE prophylaxis:               Resume Eliquis     - ID:                Perioperative antibiotics   - Metabolic Bone Disease:               Vitamin d  low normal                          Supplement    - Activity:               As above   - FEN/GI prophylaxis/Foley/Lines:                Carb modified diet   - Impediments to fracture healing:               Diabetes               Vascular disease               Multiple surgeries on same extremity near the zone of injury - Dispo:              Stable for DC   Follow up with  ortho in 10 days   Jeffrey MICAEL Mt, PA-C [phone] (C) 10/06/2023, 12:15 PM  [facility] [address] [place] [address] [phone] Jeffrey [name] (F)    After 5pm and on the weekends please log on to Amion, go to orthopaedics and the look under the [facility] Call for the provider(s) on call. You can also call our office at [phone] and then follow the prompts to be connected to the call team.  Patient ID: Jeffrey [name], male   DOB: 14-May-1955, 68 y.o.   MRN: [id number]

## 2023-10-06 NOTE — Discharge Instructions (Addendum)
 Advised to follow-up with primary care physician in 1 week. Advised to follow-up with orthopedics as scheduled.    Orthopaedic Trauma Service Discharge Instructions   General Discharge Instructions   WEIGHT BEARING STATUS: Touchdown weightbearing left lower extremity using walker  RANGE OF MOTION/ACTIVITY: Unrestricted range of motion left knee.  Activity as tolerated while maintaining weightbearing restrictions  Bone health: Vitamin D  levels are low normal.  Continue to take 5000 IUs vitamin D3 daily  Review the following resource for additional information regarding bone health  [email or web address].cy  Wound Care: Daily dressing changes starting on 10/08/2023.  See below Discharge Wound Care Instructions  Do NOT apply any ointments, solutions or lotions to pin sites or surgical wounds.  These prevent needed drainage and even though solutions like hydrogen peroxide kill bacteria, they also damage cells lining the pin sites that help fight infection.  Applying lotions or ointments can keep the wounds moist and can cause them to breakdown and open up as well. This can increase the risk for infection. When in doubt call the office.  Surgical incisions should be dressed daily.  If any drainage is noted, use one layer of adaptic or Mepitel, then gauze, Kerlix, and an ace wrap.  NetCamper.cz [email or web address]-[email or web address]_rd_[email or web address]&[email or web address]  [email or web address].[email or web address]  These dressing supplies should be available at local medical supply stores ([employer], [employer], etc). They are not usually carried at places like [facility], [facility], [facility], etc  Once the incision is completely dry and without drainage, it may be left open to air out.  Showering may begin 36-48 hours later.   Cleaning gently with soap and water .  Traumatic wounds should be dressed daily as well.    One layer of adaptic, gauze, Kerlix, then ace wrap.  The adaptic can be discontinued once the draining has ceased    If you have a wet to dry dressing: wet the gauze with saline the squeeze as much saline out so the gauze is moist (not soaking wet), place moistened gauze over wound, then place a dry gauze over the moist one, followed by Kerlix wrap, then ace wrap.  DVT/PE prophylaxis: eliquis   Diet: as you were eating previously.  Can use over the counter stool softeners and bowel preparations, such as Miralax , to help with bowel movements.  Narcotics can be constipating.  Be sure to drink plenty of fluids  PAIN MEDICATION USE AND EXPECTATIONS  You have likely been given narcotic medications to help control your pain.  After a traumatic event that results in an fracture (broken bone) with or without surgery, it is ok to use narcotic pain medications to help control one's pain.  We understand that everyone responds to pain differently and each individual patient will be evaluated on a regular basis for the continued need for narcotic medications. Ideally, narcotic medication use should last no more than 6-8 weeks (coinciding with fracture healing).   As a patient it is your responsibility as well to monitor narcotic medication use and report the amount and frequency you use these medications when you come to your office visit.   We would also advise that if you are using narcotic medications, you should take a dose prior to therapy to maximize you participation.  IF YOU ARE ON NARCOTIC MEDICATIONS IT IS NOT PERMISSIBLE TO OPERATE A MOTOR VEHICLE (MOTORCYCLE/CAR/TRUCK/MOPED) OR HEAVY MACHINERY DO NOT MIX NARCOTICS WITH OTHER CNS (CENTRAL NERVOUS SYSTEM) DEPRESSANTS SUCH AS ALCOHOL   POST-OPERATIVE OPIOID TAPER INSTRUCTIONS: It is important to wean off of your opioid medication  as soon as possible. If you do  not need pain medication after your surgery it is ok to stop day one. Opioids include: Codeine, Hydrocodone (Norco, Vicodin), Oxycodone (Percocet, oxycontin ) and hydromorphone  amongst others.  Long term and even short term use of opiods can cause: Increased pain response Dependence Constipation Depression Respiratory depression And more.  Withdrawal symptoms can include Flu like symptoms Nausea, vomiting And more Techniques to manage these symptoms Hydrate well Eat regular healthy meals Stay active Use relaxation techniques(deep breathing, meditating, yoga) Do Not substitute Alcohol to help with tapering If you have been on opioids for less than two weeks and do not have pain than it is ok to stop all together.  Plan to wean off of opioids This plan should start within one week post op of your fracture surgery  Maintain the same interval or time between taking each dose and first decrease the dose.  Cut the total daily intake of opioids by one tablet each day Next start to increase the time between doses. The last dose that should be eliminated is the evening dose.    STOP SMOKING OR USING NICOTINE PRODUCTS!!!!  As discussed nicotine severely impairs your body's ability to heal surgical and traumatic wounds but also impairs bone healing.  Wounds and bone heal by forming microscopic blood vessels (angiogenesis) and nicotine is a vasoconstrictor (essentially, shrinks blood vessels).  Therefore, if vasoconstriction occurs to these microscopic blood vessels they essentially disappear and are unable to deliver necessary nutrients to the healing tissue.  This is one modifiable factor that you can do to dramatically increase your chances of healing your injury.    (This means no smoking, no nicotine gum, patches, etc)  DO NOT USE NONSTEROIDAL ANTI-INFLAMMATORY DRUGS (NSAID'S)  Using products such as Advil (ibuprofen), Aleve (naproxen), Motrin (ibuprofen) for additional pain control during  fracture healing can delay and/or prevent the healing response.  If you would like to take over the counter (OTC) medication, Tylenol  (acetaminophen ) is ok.  However, some narcotic medications that are given for pain control contain acetaminophen  as well. Therefore, you should not exceed more than 4000 mg of tylenol  in a day if you do not have liver disease.  Also note that there are may OTC medicines, such as cold medicines and allergy medicines that my contain tylenol  as well.  If you have any questions about medications and/or interactions please ask your doctor/PA or your pharmacist.      ICE AND ELEVATE INJURED/OPERATIVE EXTREMITY  Using ice and elevating the injured extremity above your heart can help with swelling and pain control.  Icing in a pulsatile fashion, such as 20 minutes on and 20 minutes off, can be followed.    Do not place ice directly on skin. Make sure there is a barrier between to skin and the ice pack.    Using frozen items such as frozen peas works well as the conform nicely to the are that needs to be iced.  USE AN ACE WRAP OR TED HOSE FOR SWELLING CONTROL  In addition to icing and elevation, Ace wraps or TED hose are used to help limit and resolve swelling.  It is recommended to use Ace wraps or TED hose until you are informed to stop.    When using Ace Wraps start the wrapping distally (farthest away from the body) and wrap proximally (closer to the body)   Example: If you had surgery on your leg and you do not have a splint on, start the ace  wrap at the toes and work your way up to the thigh        If you had surgery on your upper extremity and do not have a splint on, start the ace wrap at your fingers and work your way up to the upper arm  IF YOU ARE IN A SPLINT OR CAST DO NOT REMOVE IT FOR ANY REASON   If your splint gets wet for any reason please contact the office immediately. You may shower in your splint or cast as long as you keep it dry.  This can be done by  wrapping in a cast cover or garbage back (or similar)  Do Not stick any thing down your splint or cast such as pencils, money, or hangers to try and scratch yourself with.  If you feel itchy take benadryl  as prescribed on the bottle for itching  IF YOU ARE IN A CAM BOOT (BLACK BOOT)  You may remove boot periodically. Perform daily dressing changes as noted below.  Wash the liner of the boot regularly and wear a sock when wearing the boot. It is recommended that you sleep in the boot until told otherwise    Call office for the following: Temperature greater than 101F Persistent nausea and vomiting Severe uncontrolled pain Redness, tenderness, or signs of infection (pain, swelling, redness, odor or green/yellow discharge around the site) Difficulty breathing, headache or visual disturbances Hives Persistent dizziness or light-headedness Extreme fatigue Any other questions or concerns you may have after discharge  In an emergency, call 911 or go to an Emergency Department at a nearby hospital  HELPFUL INFORMATION  If you had a block, it will wear off between 8-24 hrs postop typically.  This is period when your pain may go from nearly zero to the pain you would have had postop without the block.  This is an abrupt transition but nothing dangerous is happening.  You may take an extra dose of narcotic when this happens.  You should wean off your narcotic medicines as soon as you are able.  Most patients will be off or using minimal narcotics before their first postop appointment.   We suggest you use the pain medication the first night prior to going to bed, in order to ease any pain when the anesthesia wears off. You should avoid taking pain medications on an empty stomach as it will make you nauseous.  Do not drink alcoholic beverages or take illicit drugs when taking pain medications.  In most states it is against the law to drive while you are in a splint or sling.  And certainly against  the law to drive while taking narcotics.  You may return to work/school in the next couple of days when you feel up to it.   Pain medication may make you constipated.  Below are a few solutions to try in this order: Decrease the amount of pain medication if you aren't having pain. Drink lots of decaffeinated fluids. Drink prune juice and/or each dried prunes  If the first 3 don't work start with additional solutions Take Colace - an over-the-counter stool softener Take Senokot - an over-the-counter laxative Take Miralax  - a stronger over-the-counter laxative     CALL THE OFFICE WITH ANY QUESTIONS OR CONCERNS: [phone]   VISIT OUR WEBSITE FOR ADDITIONAL INFORMATION: [email or web address]

## 2023-10-07 ENCOUNTER — Other Ambulatory Visit (HOSPITAL_COMMUNITY): Payer: Self-pay | Admitting: Physician Assistant

## 2023-10-07 DIAGNOSIS — S7292XA Unspecified fracture of left femur, initial encounter for closed fracture: Secondary | ICD-10-CM

## 2023-10-07 MED ORDER — OXYCODONE-ACETAMINOPHEN 7.5-325 MG PO TABS: 1.5000 | ORAL_TABLET | ORAL | 0 refills | Status: AC | PRN

## 2023-10-07 NOTE — Op Note (Signed)
 10/03/2023  9:47 PM  PATIENT:  Jeffrey [name]  68 y.o. male  PRE-OPERATIVE DIAGNOSIS:   1. LEFT PERIPROSTHETIC SUPRACONDYLAR FEMUR FRACTURE 2. RETAINED FRACTURE FIXATION HARDWARE LEFT DISTAL FEMUR   POST-OPERATIVE DIAGNOSIS:  1. LEFT PERIPROSTHETIC SUPRACONDYLAR FEMUR FRACTURE 2. LEFT SUPRACONDYLAR NONUNION FRACTURE 3. STABLE COLLATERAL LIGAMENTS LEFT KNEE WITHOUT EXCESSIVE RECURVATUM FOLLOWING REPAIR  PROCEDURE:   1. OPEN REDUCTION INTERNAL FIXATION OF LEFT PERIPROSTHETIC FEMUR FRACTURE WITH BIOMET NCB PLATE 2. LEFT DISTAL FEMUR NONUNION REPAIR WITH CURETTAGE AND ALLOGRAFTING 3. REMOVAL OF DEEP IMPLANT RIGHT FEMUR 4. APPLICATION OF MANUAL STRESS UNDER FLUOROSCOPY LEFT TOTAL KNEE IMPLANT  SURGEON:  Mariselda Badalamenti, MD  PHYSICIAN ASSISTANT: Francis Mt, PA-C  ANESTHESIA:   GENERAL  I/O:  Total I/O In: 240 [P.O.:240] Out: -   SPECIMEN:  Anearobic and aerobic left distal femur  TOURNIQUET:  NONE  COMPLICATIONS: NONE  DICTATION: Note written in EPIC  DISPOSITION: TO PACU  CONDITION: STABLE  DELAY START OF DVT PROPHYLAXIS BECAUSE OF BLEEDING RISK: NO  BRIEF SUMMARY OF INDICATION FOR PROCEDURE:  Jeffrey [name] is a 68 y.o. who sustained a supracondylar femur fracture, which was quite distal and near to the femoral implant and below an old long femoral nail with distal locking bolts in a low velocity ground level fall near his tractor. Local hypertrophy is concerning for occult stress fracture. The risks and benefits of surgery were discussed with the patient and family, including the possibility of infection, nerve injury, vessel injury, wound breakdown, arthritis, symptomatic hardware, DVT/ PE, loss of motion, malunion, nonunion, heart attack, stroke, death, implant loosening, and need for further surgery among others.  These risks were acknowledged and consent provided to proceed.   BRIEF SUMMARY OF PROCEDURE:  The patient was taken to the operating room where general  anesthesia was induced and after receipt of preoperative antibiotics.  The left lower extremity was prepped and draped in usual sterile fashion.  No tourniquet was used during the procedure.  A radiolucent triangle was placed underneath the femur and towel bumps to restore appropriate alignment and length. C-arm was brought in to confirm the appropriate position of the distal incision.  This was checked on lateral as well.    A distinctly different incision more proximal than would be used just to repair the fracture was then made in order to go down and remove the two distal locking bolts. These bolts had both overgrown with slight n incision was then made.  Dissection was carried down to the IT band.  It was split in line with the incision.  The deep protractor was placed.  Hematoma evacuated. My assistant pulled and maintained traction and dialed in the rotation for alignment.  I then introduced the Biomet NCB plate.  I placed a pin distally parallel to the femoral component and joint line of the tibial tray and then checked the position on both AP and lateral views proximally, placing a single screw distally and a drill bit through the most proximal hole.  To create a bridge construct. I then placed multiple screws in the articular block, checking their trajector and alignment with fluoro, followed by additional standard screws proximally.  Locking caps were placed over the heads of the screws distally but none proximally after confirming appropriate position of all screws on orthogonal views. Wounds were irrigated thoroughly and then closed in standard layered fashion using #1 Vicryl for the tensor, 0 Vicryl for the deep subcu, 2-0 Vicryl and 3-0 vertical mattress sutures for the  skin.  A sterile gently compressive dressing was then applied with an Ace wrap from foot to thigh as well as a knee immobilizer until the patient wakes up adequately from anesthesia at which time she will be allowed  unrestricted range of motion. Francis Mt, PA-C, assisted me throughout as did a PA student and an assistant was necessary to obtain and maintain reduction during provisional and definitive fixation and also assisted with wound closure.   PROGNOSIS:  Because of comorbidities and increased BMI, patient is at increased risk for perioperative complications. PT/ OT to assist with touch down weightbearing and unrestricted range of motion of the knee without bracing.  Formal pharmacologic DVT prophylaxis with Lovenox. F/u in 10-14 days for removal of sutures.     Jeffrey [name]. Jeffrey [name], M.D.

## 2023-10-07 NOTE — Progress Notes (Unsigned)
 Patient's wife called the answering service stating his pain has been difficult to control. He has been doing well with 1.5-2 tablets of Percocet 7.5 mg. He was only prescribed 10 tablets at discharge so he is running out of medications. Refill sent to his pharmacy.   Aleck Stalling, PA-C 10/07/23

## 2023-11-08 ENCOUNTER — Encounter: Payer: Self-pay | Admitting: Physician Assistant

## 2023-11-08 ENCOUNTER — Other Ambulatory Visit: Payer: Self-pay

## 2023-11-08 ENCOUNTER — Ambulatory Visit (INDEPENDENT_AMBULATORY_CARE_PROVIDER_SITE_OTHER): Admitting: Physician Assistant

## 2023-11-08 ENCOUNTER — Other Ambulatory Visit (INDEPENDENT_AMBULATORY_CARE_PROVIDER_SITE_OTHER)

## 2023-11-08 DIAGNOSIS — Z96652 Presence of left artificial knee joint: Secondary | ICD-10-CM

## 2023-11-08 NOTE — Progress Notes (Signed)
 Post-Op Visit Note   Patient: Jeffrey [name]           Date of Birth: [birth year]           MRN: [id number] Visit Date: 11/08/2023 PCP: [facility]   Assessment & Plan:  Chief Complaint:  Chief Complaint  Patient presents with   Left Knee - Follow-up    Left TKA 05/12/2023 with Dr.Xu and ORIF distal femur 10/03/2023 with Dr.Handy   Visit Diagnoses:  1. Status post total left knee replacement     Plan: Patient is a pleasant 68 year old gentleman who comes in today 6 months status post left total knee replacement 05/12/2023 followed by ORIF left periprosthetic femur fracture by Dr. Celena on 10/03/2023.  He has been doing okay.  He is still in some pain.  He has been compliant nonweightbearing the left lower extremity.  He has been taking Eliquis  for DVT prophylaxis.  He is getting home health physical therapy working on left hip and knee range of motion.  Examination of his left knee reveals range of motion from 0 to 110 degrees.  He is neurovascularly intact distally.  Patient tells me he is following up with Dr. Celena following his appointment with me today.  Will defer precautions to Dr. Celena.  Patient will continue with range of motion of the left knee.  Follow-up with us  in 6 months for repeat evaluation and x-rays of the left knee.  Dental prophylaxis reinforced.  Call with concerns or questions.  Follow-Up Instructions: Return in about 6 months (around 05/07/2024).   Orders:  Orders Placed This Encounter  Procedures   XR FEMUR MIN 2 VIEWS LEFT   No orders of the defined types were placed in this encounter.   Imaging: XR FEMUR MIN 2 VIEWS LEFT Result Date: 11/08/2023 X-rays demonstrate evidence of consolidation at the fracture site.  No hardware complication.   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 Fib   Hyperlipidemia    Intracerebral hemorrhage (HCC) 1960   Peripheral neuropathy    Post-traumatic osteoarthritis of left knee     Family History  Problem Relation Age of Onset   Vision loss Mother    Diabetes Mother    Crohn's disease Father     Past Surgical History:  Procedure Laterality Date   APPENDECTOMY  1960   I & D EXTREMITY Right 06/19/2020   Procedure: IRRIGATION AND DEBRIDEMENT LONG FINGER;  Surgeon: Murrell Drivers, MD;  Location: [facility] OR;  Service: Orthopedics;  Laterality: Right;   LEFT HEART CATH  06/20/2018   ORIF FEMUR FRACTURE  1988   ORIF FEMUR FRACTURE Left 10/03/2023   Procedure: OPEN REDUCTION INTERNAL FIXATION (ORIF) DISTAL FEMUR FRACTURE;  Surgeon:  Celena Sharper, MD;  Location: [facility] OR;  Service: Orthopedics;  Laterality: Left;   TOTAL KNEE ARTHROPLASTY Left 05/12/2023   Procedure: LEFT TOTAL KNEE ARTHROPLASTY;  Surgeon: Jerri Kay HERO, MD;  Location: [facility] OR;  Service: Orthopedics;  Laterality: Left;   VARICOSE VEIN SURGERY Right    right leg   Social History   Occupational History    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

## 2023-12-12 NOTE — Therapy (Incomplete)
 OUTPATIENT PHYSICAL THERAPY LOWER EXTREMITY EVALUATION   Patient Name: Jeffrey [name] MRN: [id number] DOB:1955/08/24, 68 y.o., male Today's Date: 12/12/2023  END OF SESSION:   Past Medical History:  Diagnosis Date   Aneurysm of infrarenal abdominal aorta    CAD (coronary artery disease)    4 stents   Carotid artery disease    Left ICA   Diabetes mellitus without complication (HCC)    Dysrhythmia    A. Fib   Hyperlipidemia    Intracerebral hemorrhage (HCC) 1960   Peripheral neuropathy    Post-traumatic osteoarthritis of left knee    Past Surgical History:  Procedure Laterality Date   APPENDECTOMY  1960   I & D EXTREMITY Right 06/19/2020   Procedure: IRRIGATION AND DEBRIDEMENT LONG FINGER;  Surgeon: Murrell Drivers, MD;  Location: [facility];  Service: Orthopedics;  Laterality: Right;   LEFT HEART CATH  06/20/2018   ORIF FEMUR FRACTURE  1988   ORIF FEMUR FRACTURE Left 10/03/2023   Procedure: OPEN REDUCTION INTERNAL FIXATION (ORIF) DISTAL FEMUR FRACTURE;  Surgeon: Celena Sharper, MD;  Location: [facility] OR;  Service: Orthopedics;  Laterality: Left;   TOTAL KNEE ARTHROPLASTY Left 05/12/2023   Procedure: LEFT TOTAL KNEE ARTHROPLASTY;  Surgeon: Jerri Kay CHRISTELLA, MD;  Location: [facility];  Service: Orthopedics;  Laterality: Left;   VARICOSE VEIN SURGERY Right    right leg   Patient Active Problem List   Diagnosis Date Noted   Periprosthetic fracture around internal prosthetic left knee joint 10/02/2023   Supracondylar fracture of left femur (HCC) 10/02/2023   Closed left femoral fracture (HCC) 10/02/2023   Hypokalemia 10/02/2023   Status post total left knee replacement 05/12/2023   Post-traumatic osteoarthritis of left knee 01/31/2023   Atrial fibrillation (HCC) 11/26/2021   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  Rationale for Evaluation and Treatment: Rehabilitation  ONSET DATE: ***  SUBJECTIVE:   SUBJECTIVE STATEMENT: ***  PERTINENT HISTORY: Left TKA 05/12/23, Rt TKA 05/12/2023, OA Left femur fx (shaft just proximal to distal plate) ORIF 8011 & removal 01/05/22, A-Fib, CAD 4 stents, carotid artery disease, DM, HTN, HLD, Depression, aneurysm of infrarenal abdominal aorta, peripheral neuropathy    PAIN:  NPRS scale: ***/10 Pain location: *** Pain description: *** Aggravating factors: *** Relieving factors: ***  PRECAUTIONS: None  WEIGHT BEARING RESTRICTIONS: No  FALLS:  Has patient fallen in last 6 months? {fallsyesno:27318}  LIVING ENVIRONMENT: Lives with: lives with their spouse and dog 55#  [place]ives [place] farm.  Tractor climb.  Lives in: House Stairs: Yes:  External: 5 steps; 2 rails can reach both. Has following equipment at home: Single point cane, Walker - 2 wheeled, Wheelchair (manual), [employer], and Grab bars  OCCUPATION: retired. Works on farm. Climb tractor. Handyman work.   PLOF: Independent  PATIENT GOALS: work on his farm. Walk around. Travel with RV (bumper pull) needs to set up   OBJECTIVE:   DIAGNOSTIC FINDINGS: ***  PATIENT SURVEYS:  Patient-Specific Activity Scoring Scheme  0 represents "unable to perform." 10 represents "able to perform at prior level. 0 1 2 3 4 5 6 7 8 9  10 (Date and Score)   Activity Eval     1. ***      2. ***      3. ***  4.    5.    Score ***    Total score = sum of the activity scores/number of activities Minimum detectable change (90%CI) for average score = 2 points Minimum detectable change (90%CI) for single activity score = 3 points  COGNITION: Overall cognitive status: WFL    SENSATION: {sensation:27233}  EDEMA:  {edema:24020}  MUSCLE LENGTH: Hamstrings: Right *** deg; Left *** deg Thomas test: Right *** deg; Left *** deg  POSTURE:   {posture:25561}  PALPATION: ***  LOWER EXTREMITY ROM:   ROM Right eval Left eval  Hip flexion    Hip extension    Hip abduction    Hip adduction    Hip internal rotation    Hip external rotation    Knee flexion    Knee extension    Ankle dorsiflexion    Ankle plantarflexion    Ankle inversion    Ankle eversion     (Blank rows = not tested)  LOWER EXTREMITY MMT:  MMT Right eval Left eval  Hip flexion    Hip extension    Hip abduction    Hip adduction    Hip internal rotation    Hip external rotation    Knee flexion    Knee extension    Ankle dorsiflexion    Ankle plantarflexion    Ankle inversion    Ankle eversion     (Blank rows = not tested)  LOWER EXTREMITY SPECIAL TESTS:  {LEspecialtests:26242}  FUNCTIONAL TESTS:  18 inch chair transfer: Lt SLS: Rt SLS:  GAIT: Distance walked: *** Assistive device utilized: {Assistive devices:23999} Level of assistance: {Levels of assistance:24026} Comments: ***                                                                                                                                                                        TODAY'S TREATMENT                                                                          DATE: Therex:    HEP instruction/performance c cues for techniques, handout provided.  Trial set performed of each for comprehension and symptom assessment.  See below for exercise list  PATIENT EDUCATION:  Education details: HEP, POC Person educated: Patient Education method: Explanation, Demonstration, Verbal cues, and Handouts Education comprehension: verbalized understanding, returned demonstration, and verbal cues required  HOME EXERCISE PROGRAM: ***  ASSESSMENT:  CLINICAL IMPRESSION: Patient is a 68 y.o. who comes to  clinic with complaints of ***pain with mobility, strength and movement coordination deficits that impair their ability to perform usual daily and recreational functional  activities without increase difficulty/symptoms at this time.  Patient to benefit from skilled PT services to address impairments and limitations to improve to previous level of function without restriction secondary to condition.   OBJECTIVE IMPAIRMENTS: {opptimpairments:25111}.   ACTIVITY LIMITATIONS: {activitylimitations:27494}  PARTICIPATION LIMITATIONS: {participationrestrictions:25113}  PERSONAL FACTORS: {Personal factors:25162} are also affecting patient's functional outcome.   REHAB POTENTIAL: {rehabpotential:25112}  CLINICAL DECISION MAKING: {clinical decision making:25114}  EVALUATION COMPLEXITY: {Evaluation complexity:25115}   GOALS: Goals reviewed with patient? Yes  SHORT TERM GOALS: (target date for Short term goals are 3 weeks ***)   1.  Patient will demonstrate independent use of home exercise program to maintain progress from in clinic treatments.  Goal status: New  LONG TERM GOALS: (target dates for all long term goals are 10 weeks  *** )   1. Patient will demonstrate/report pain at worst less than or equal to 2/10 to facilitate minimal limitation in daily activity secondary to pain symptoms.  Goal status: New   2. Patient will demonstrate independent use of home exercise program to facilitate ability to maintain/progress functional gains from skilled physical therapy services.  Goal status: New   3. Patient will demonstrate Patient specific functional scale avg > or = *** to indicate reduced disability due to condition.   Goal status: New   4.  Patient will demonstrate *** LE MMT 5/5 throughout to faciltiate usual transfers, stairs, squatting at [facility] for daily life.   Goal status: New   5.  Patient will demonstrate Goal status: New   6.  *** Goal status: New   7.  *** Goal Status: New   PLAN:  PT FREQUENCY: 1-2x/week  PT DURATION: 10 weeks  PLANNED INTERVENTIONS: Can include 02853- PT Re-evaluation, 97110-Therapeutic exercises, 97530-  Therapeutic activity, [id number]- Neuromuscular re-education, 97535- Self Care, 97140- Manual therapy, [phone]- Gait training, [phone]- Orthotic Fit/training, [phone]- Canalith repositioning, [id number]- Aquatic Therapy, [phone]- Electrical stimulation (unattended), [id number] Physical performance testing, 97016- Vasopneumatic device, [id number]- Ultrasound, [id number]- Traction (mechanical), [id number]- Ionotophoresis 4mg /ml Dexamethasone ,  79439 - Needle insertion w/o injection 1 or 2 muscles, 20561 - Needle insertion w/o injection 3 or more muscles.   Patient/Family education, Balance training, Stair training, Taping, Dry Needling, Joint mobilization, Joint manipulation, Spinal manipulation, Spinal mobilization, Scar mobilization, Vestibular training, Visual/preceptual remediation/compensation, DME instructions, Cryotherapy, and Moist heat.  All performed as medically necessary.  All included unless contraindicated  PLAN FOR NEXT SESSION: Review HEP knowledge/results.    SABRAmw

## 2023-12-13 ENCOUNTER — Ambulatory Visit (INDEPENDENT_AMBULATORY_CARE_PROVIDER_SITE_OTHER)

## 2023-12-13 ENCOUNTER — Other Ambulatory Visit: Payer: Self-pay

## 2023-12-13 DIAGNOSIS — M6281 Muscle weakness (generalized): Secondary | ICD-10-CM

## 2023-12-13 DIAGNOSIS — R2689 Other abnormalities of gait and mobility: Secondary | ICD-10-CM

## 2023-12-13 DIAGNOSIS — M25662 Stiffness of left knee, not elsewhere classified: Secondary | ICD-10-CM

## 2023-12-13 DIAGNOSIS — R2681 Unsteadiness on feet: Secondary | ICD-10-CM

## 2023-12-13 DIAGNOSIS — M25562 Pain in left knee: Secondary | ICD-10-CM | POA: Diagnosis not present

## 2023-12-13 DIAGNOSIS — G8929 Other chronic pain: Secondary | ICD-10-CM

## 2023-12-13 DIAGNOSIS — M79662 Pain in left lower leg: Secondary | ICD-10-CM

## 2023-12-13 DIAGNOSIS — R600 Localized edema: Secondary | ICD-10-CM

## 2023-12-13 NOTE — Therapy (Signed)
 OUTPATIENT PHYSICAL THERAPY LOWER EXTREMITY EVALUATION   Patient Name: Jeffrey [name] MRN: [id number] DOB:[birth year], 68 y.o., male Today's Date: 12/13/2023  END OF SESSION:  PT End of Session - 12/13/23 0915     Visit Number 1    Number of Visits 21    Date for Recertification  02/21/24    PT Start Time 0820    PT Stop Time 0855    PT Time Calculation (min) 35 min    Activity Tolerance Patient tolerated treatment well    Behavior During Therapy [facility] for tasks assessed/performed          Past Medical History:  Diagnosis Date   Aneurysm of infrarenal abdominal aorta    CAD (coronary artery disease)    4 stents   Carotid artery disease    Left ICA   Diabetes mellitus without complication (HCC)    Dysrhythmia    A. Fib   Hyperlipidemia    Intracerebral hemorrhage (HCC) 1960   Peripheral neuropathy    Post-traumatic osteoarthritis of left knee    Past Surgical History:  Procedure Laterality Date   APPENDECTOMY  1960   I & D EXTREMITY Right 06/19/2020   Procedure: IRRIGATION AND DEBRIDEMENT LONG FINGER;  Surgeon: Murrell Drivers, MD;  Location: [facility];  Service: Orthopedics;  Laterality: Right;   LEFT HEART CATH  06/20/2018   ORIF FEMUR FRACTURE  1988   ORIF FEMUR FRACTURE Left 10/03/2023   Procedure: OPEN REDUCTION INTERNAL FIXATION (ORIF) DISTAL FEMUR FRACTURE;  Surgeon: Celena Sharper, MD;  Location: [facility];  Service: Orthopedics;  Laterality: Left;   TOTAL KNEE ARTHROPLASTY Left 05/12/2023   Procedure: LEFT TOTAL KNEE ARTHROPLASTY;  Surgeon: Jerri Kay CHRISTELLA, MD;  Location: [facility];  Service: Orthopedics;  Laterality: Left;   VARICOSE VEIN SURGERY Right    right leg   Patient Active Problem List   Diagnosis Date Noted   Periprosthetic fracture around internal prosthetic left knee joint 10/02/2023   Supracondylar fracture of left femur (HCC) 10/02/2023   Closed left femoral fracture (HCC) 10/02/2023   Hypokalemia 10/02/2023   Status post total left knee replacement  05/12/2023   Post-traumatic osteoarthritis of left knee 01/31/2023   Atrial fibrillation (HCC) 11/26/2021   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   Occlusion and stenosis of carotid artery without mention of cerebral infarction 11/19/2013    [facility]  REFERRING PROVIDER: Celena Sharper, MD , Ronal Grave PA  REFERRING DIAG: femur 15 visits  Closed fracture of left femur, unspecified fracture morphology, unspecified portion of femur, initial encounter (HCC)  - Primary S72.92XA    THERAPY DIAG:  Chronic pain of left knee  Muscle weakness (generalized)  Stiffness of left knee, not elsewhere classified  Other abnormalities of gait and mobility  Unsteadiness on feet  Localized edema  Pain in left lower leg  Rationale for Evaluation and Treatment: Rehabilitation  ONSET DATE: 10/03/23  SUBJECTIVE:   SUBJECTIVE STATEMENT:  Pt had L TKA and was discharged in spring of 2025.  He then stepped on a tree nut and slipped/fell fracturing his L femur in July 2025.  He is now s/p ORIF of L femur.  He is WBAT per Careers adviser.  HHPT ended end of September   PERTINENT HISTORY: L TKA 05/12/23,  L femur fx after fall 09/30/23 ORIF 10/03/23, this is 3rd L femur fx PAIN:  Are you having pain? Yes: NPRS scale: 7/39max Pain location: anterior Pain description: dull, sharp Aggravating factors: walking, standing Relieving factors: rest, pain  meds at night  PRECAUTIONS: Other: + L TKA  RED FLAGS: 3rd L femur fracture   WEIGHT BEARING RESTRICTIONS: Yes WBAT  FALLS:  Has patient fallen in last 6 months? Yes. Number of falls 1  LIVING ENVIRONMENT: Lives with: lives with their spouse Lives in: House/apartment Stairs: Yes: External: 4 steps; can reach both Has following equipment at home: Single point cane  OCCUPATION: retired   PLOF: Independent  PATIENT GOALS: decrease pain  NEXT MD VISIT: unknown  OBJECTIVE:  Note: Objective measures were completed at Evaluation  unless otherwise noted.  DIAGNOSTIC FINDINGS: 11/08/23 X-rays demonstrate evidence of consolidation at the fracture site.  No  hardware complication.   PATIENT SURVEYS:  PSFS: THE PATIENT SPECIFIC FUNCTIONAL SCALE  Place score of 0-10 (0 = unable to perform activity and 10 = able to perform activity at the same level as before injury or problem)  Activity Date: 12/13/23    walking 7    2.transfers 8    3.stairs without handrails 2    4.      Total Score 5.6      Total Score = Sum of activity scores/number of activities  Minimally Detectable Change: 3 points (for single activity); 2 points (for average score)  [employer] (nd). The Patient Specific Functional Scale . Retrieved from [email or web address]   COGNITION: Overall cognitive status: Within functional limits for tasks assessed     SENSATION: + hx neuropathy feet  EDEMA: ---   MUSCLE LENGTH: Hamstrings: Right mod restriction  deg; Left Right mod restriction    POSTURE: flexed trunk   PALPATION: 1+ tenderness at ant knee and at lateral incision  LOWER EXTREMITY ROM: hip PROM [facility]  A/P ROM Right eval Left eval  Hip flexion  100  Hip extension    Hip abduction  40  Hip adduction    Hip internal rotation    Hip external rotation    Knee flexion  90/100  Knee extension  +5  Ankle dorsiflexion    Ankle plantarflexion    Ankle inversion    Ankle eversion     (Blank rows = not tested)  LOWER EXTREMITY MMT:  MMT Right eval Left eval  Hip flexion  4-  Hip extension  4-  Hip abduction  4-  Hip adduction    Hip internal rotation    Hip external rotation    Knee flexion  4  Knee extension  4-  Ankle dorsiflexion    Ankle plantarflexion    Ankle inversion    Ankle eversion     (Blank rows = not tested)  LOWER EXTREMITY SPECIAL TESTS: none   FUNCTIONAL TESTS:  5 times sit to stand: 15 sec with UE use and inc  sway/neuropathy  GAIT: Distance walked: short community Assistive device utilized: Single point cane Level of assistance: Modified independence Comments: ---  TREATMENT DATE:  12/13/23 Initial evaluation completed followed by instruction and trial set of HEP.   PATIENT EDUCATION:  Education details: HEP Person educated: Patient Education method: Explanation, Demonstration, and Actor cues Education comprehension: verbalized understanding, returned demonstration, and verbal cues required  HOME EXERCISE PROGRAM: Access Code: [email or web address] URL: [email or web address] Date: 12/13/2023 Prepared by: Burnard Meth  Exercises - Long Sitting Quad Set with Towel Roll Under Heel  - 2 x daily - 7 x weekly - 2 sets - 10 reps - 2 hold - Active Straight Leg Raise with Quad Set  - 2 x daily - 7 x weekly - 2 sets - 10 reps - Supine Heel Slides  - 2 x daily - 7 x weekly - 2 sets - 10 reps - 2 hold - Supine Bridge  - 2 x daily - 7 x weekly - 2 sets - 10 reps - 2 hold - Mini Squat with Counter Support  - 2 x daily - 7 x weekly - 2 sets - 10 reps  ASSESSMENT:  CLINICAL IMPRESSION: Patient is a 68 y.o. male who was seen today for physical therapy evaluation and treatment for L femur ORIF.  He presents with decreased ROM, decreased strength, altered gait and pain.  He would benefit from skilled PT to address these deficits and return to previous LOA.   OBJECTIVE IMPAIRMENTS: decreased endurance, decreased ROM, decreased strength, impaired flexibility, and pain.   ACTIVITY LIMITATIONS: squatting, stairs, and transfers  PARTICIPATION LIMITATIONS: community activity, church, and farming  PERSONAL FACTORS: 1-2 comorbidities: previous fractures, LE neuropathy are also affecting patient's functional outcome.   REHAB POTENTIAL: Good  CLINICAL DECISION MAKING:  Stable/uncomplicated  EVALUATION COMPLEXITY: Moderate   GOALS: Goals reviewed with patient? Yes  SHORT TERM GOALS: Target date: 01/10/2024   Pt to be independent with HEP. Baseline: Goal status: INITIAL  2.  Decrease pain by 1-2 levels. Baseline: 7/10 Goal status: INITIAL  LONG TERM GOALS: Target date: 02/21/2024  10weeks      Pt to be independent with self progressive HEP at time of discharge. Baseline:  Goal status: INITIAL  2.  Pt able to ambulate community distances without AD and no pain. Baseline: SPC Goal status: INITIAL  3.  Increase L LE strength by 1/2 grade to 1 full grade. Baseline: 4- Goal status: INITIAL  4.  Increase knee AROM to 0>105 active. Baseline: 5>90 Goal status: INITIAL  5.  Decrease max pain to 2/10 with all activities. Baseline: 7/10 Goal status: INITIAL  6.  Increase PSFS score  by at least 2-3 points to demonstrate measurable difference. Baseline: 5.66 Goal status: INITIAL   PLAN:  PT FREQUENCY: 2x/week  PT DURATION: 10  PLANNED INTERVENTIONS: 97164- PT Re-evaluation, 97110-Therapeutic exercises, 97530- Therapeutic activity, 97112- Neuromuscular re-education, 97535- Self Care, 02859- Manual therapy, [phone]- Gait training, [phone]- Aquatic Therapy, Patient/Family education, Balance training, Stair training, Joint mobilization, Scar mobilization, and Moist heat  PLAN FOR NEXT SESSION: start on rec bike or Nustep   Burnard CHRISTELLA Meth, PT 12/13/2023, 9:16 AM

## 2023-12-21 ENCOUNTER — Ambulatory Visit (INDEPENDENT_AMBULATORY_CARE_PROVIDER_SITE_OTHER)

## 2023-12-21 DIAGNOSIS — M6281 Muscle weakness (generalized): Secondary | ICD-10-CM

## 2023-12-21 DIAGNOSIS — M25662 Stiffness of left knee, not elsewhere classified: Secondary | ICD-10-CM | POA: Diagnosis not present

## 2023-12-21 DIAGNOSIS — R600 Localized edema: Secondary | ICD-10-CM

## 2023-12-21 DIAGNOSIS — M79662 Pain in left lower leg: Secondary | ICD-10-CM

## 2023-12-21 DIAGNOSIS — M25562 Pain in left knee: Secondary | ICD-10-CM

## 2023-12-21 DIAGNOSIS — G8929 Other chronic pain: Secondary | ICD-10-CM

## 2023-12-21 DIAGNOSIS — R2681 Unsteadiness on feet: Secondary | ICD-10-CM

## 2023-12-21 DIAGNOSIS — R2689 Other abnormalities of gait and mobility: Secondary | ICD-10-CM

## 2023-12-21 NOTE — Therapy (Signed)
 OUTPATIENT PHYSICAL THERAPY LOWER EXTREMITY TREATMENT   Patient Name: Jeffrey [name] MRN: [id number] DOB:1955/04/23, 68 y.o., male Today's Date: 12/21/2023  END OF SESSION:  PT End of Session - 12/21/23 1320     Visit Number 2    Number of Visits 21    Date for Recertification  02/21/24    PT Start Time 1300    PT Stop Time 1338    PT Time Calculation (min) 38 min    Activity Tolerance Patient tolerated treatment well    Behavior During Therapy WFL for tasks assessed/performed           Past Medical History:  Diagnosis Date   Aneurysm of infrarenal abdominal aorta    CAD (coronary artery disease)    4 stents   Carotid artery disease    Left ICA   Diabetes mellitus without complication (HCC)    Dysrhythmia    A. Fib   Hyperlipidemia    Intracerebral hemorrhage (HCC) 1960   Peripheral neuropathy    Post-traumatic osteoarthritis of left knee    Past Surgical History:  Procedure Laterality Date   APPENDECTOMY  1960   I & D EXTREMITY Right 06/19/2020   Procedure: IRRIGATION AND DEBRIDEMENT LONG FINGER;  Surgeon: Murrell Drivers, MD;  Location: [facility];  Service: Orthopedics;  Laterality: Right;   LEFT HEART CATH  06/20/2018   ORIF FEMUR FRACTURE  1988   ORIF FEMUR FRACTURE Left 10/03/2023   Procedure: OPEN REDUCTION INTERNAL FIXATION (ORIF) DISTAL FEMUR FRACTURE;  Surgeon: Celena Sharper, MD;  Location: [facility] OR;  Service: Orthopedics;  Laterality: Left;   TOTAL KNEE ARTHROPLASTY Left 05/12/2023   Procedure: LEFT TOTAL KNEE ARTHROPLASTY;  Surgeon: Jerri Kay CHRISTELLA, MD;  Location: [facility];  Service: Orthopedics;  Laterality: Left;   VARICOSE VEIN SURGERY Right    right leg   Patient Active Problem List   Diagnosis Date Noted   Periprosthetic fracture around internal prosthetic left knee joint 10/02/2023   Supracondylar fracture of left femur (HCC) 10/02/2023   Closed left femoral fracture (HCC) 10/02/2023   Hypokalemia 10/02/2023   Status post total left knee replacement  05/12/2023   Post-traumatic osteoarthritis of left knee 01/31/2023   Atrial fibrillation (HCC) 11/26/2021   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   Occlusion and stenosis of carotid artery without mention of cerebral infarction 11/19/2013    [facility]P: [facility]  REFERRING PROVIDER: Celena Sharper, MD , Ronal Grave PA  REFERRING DIAG: femur 15 visits  Closed fracture of left femur, unspecified fracture morphology, unspecified portion of femur, initial encounter (HCC)  - Primary S72.92XA    THERAPY DIAG:  Chronic pain of left knee  Muscle weakness (generalized)  Stiffness of left knee, not elsewhere classified  Other abnormalities of gait and mobility  Unsteadiness on feet  Localized edema  Pain in left lower leg  Rationale for Evaluation and Treatment: Rehabilitation  ONSET DATE: 10/03/23  SUBJECTIVE:   SUBJECTIVE STATEMENT:  Pt reports some knee/LE pain continues.      PERTINENT HISTORY: L TKA 05/12/23,  L femur fx after fall 09/30/23 ORIF 10/03/23, this is 3rd L femur fx PAIN:  Are you having pain? Yes: NPRS scale: 6/10  Pain location: anterior Pain description: dull, sharp Aggravating factors: walking, standing Relieving factors: rest, pain meds at night  PRECAUTIONS: Other: + L TKA  RED FLAGS: 3rd L femur fracture   WEIGHT BEARING RESTRICTIONS: Yes WBAT  FALLS:  Has patient fallen in last 6 months? Yes. Number of falls 1  LIVING ENVIRONMENT: Lives with: lives with their spouse Lives in: House/apartment Stairs: Yes: External: 4 steps; can reach both Has following equipment at home: Single point cane  OCCUPATION: retired   PLOF: Independent  PATIENT GOALS: decrease pain  NEXT MD VISIT: unknown  OBJECTIVE:  Note: Objective measures were completed at Evaluation unless otherwise noted.  DIAGNOSTIC FINDINGS: 11/08/23 X-rays demonstrate evidence of consolidation at the fracture site.  No  hardware complication.   PATIENT SURVEYS:  PSFS: THE  PATIENT SPECIFIC FUNCTIONAL SCALE  Place score of 0-10 (0 = unable to perform activity and 10 = able to perform activity at the same level as before injury or problem)  Activity Date: 12/13/23    walking 7    2.transfers 8    3.stairs without handrails 2    4.      Total Score 5.6      Total Score = Sum of activity scores/number of activities  Minimally Detectable Change: 3 points (for single activity); 2 points (for average score)  [employer] [facility] (nd). The Patient Specific Functional Scale . Retrieved from [email or web address]   COGNITION: Overall cognitive status: Within functional limits for tasks assessed     SENSATION: + hx neuropathy feet  EDEMA: ---   MUSCLE LENGTH: Hamstrings: Right mod restriction  deg; Left Right mod restriction    POSTURE: flexed trunk   PALPATION: 1+ tenderness at ant knee and at lateral incision  LOWER EXTREMITY ROM: hip PROM [facility]  A/P ROM Right eval Left eval  Hip flexion  100  Hip extension    Hip abduction  40  Hip adduction    Hip internal rotation    Hip external rotation    Knee flexion  90/100  Knee extension  +5  Ankle dorsiflexion    Ankle plantarflexion    Ankle inversion    Ankle eversion     (Blank rows = not tested)  LOWER EXTREMITY MMT:  MMT Right eval Left eval  Hip flexion  4-  Hip extension  4-  Hip abduction  4-  Hip adduction    Hip internal rotation    Hip external rotation    Knee flexion  4  Knee extension  4-  Ankle dorsiflexion    Ankle plantarflexion    Ankle inversion    Ankle eversion     (Blank rows = not tested)  LOWER EXTREMITY SPECIAL TESTS: none   FUNCTIONAL TESTS:  5 times sit to stand: 15 sec with UE use and inc sway/neuropathy  GAIT: Distance walked: short community Assistive device utilized: Single point cane Level of assistance: Modified independence Comments: ---                                                                                                                                 TREATMENT DATE: L Femur 12/21/23 There ex for ROM/strengthening: Rec bike lev 1  8 min LAQ 3# 3x10 SAQ 3x10  SLR #1.5 3x10 SR 3x10  There Act for transfers, stairs: Squats 2x10 Bridges 3x10 Seated heel raises 2x10  12/13/23 Initial evaluation completed followed by instruction and trial set of HEP.   PATIENT EDUCATION:  Education details: HEP Person educated: Patient Education method: Explanation, Demonstration, and Actor cues Education comprehension: verbalized understanding, returned demonstration, and verbal cues required  HOME EXERCISE PROGRAM: Access Code: [email or web address] URL: [email or web address] Date: 12/13/2023 Prepared by: Burnard Meth  Exercises - Long Sitting Quad Set with Towel Roll Under Heel  - 2 x daily - 7 x weekly - 2 sets - 10 reps - 2 hold - Active Straight Leg Raise with Quad Set  - 2 x daily - 7 x weekly - 2 sets - 10 reps - Supine Heel Slides  - 2 x daily - 7 x weekly - 2 sets - 10 reps - 2 hold - Supine Bridge  - 2 x daily - 7 x weekly - 2 sets - 10 reps - 2 hold - Mini Squat with Counter Support  - 2 x daily - 7 x weekly - 2 sets - 10 reps  ASSESSMENT:  CLINICAL IMPRESSION: Patient needed VC for new exercises.  Unable to stand and perform heel raises due to weakness but able to sit and achieve a few sets.  OBJECTIVE IMPAIRMENTS: decreased endurance, decreased ROM, decreased strength, impaired flexibility, and pain.   ACTIVITY LIMITATIONS: squatting, stairs, and transfers  PARTICIPATION LIMITATIONS: community activity, church, and farming  PERSONAL FACTORS: 1-2 comorbidities: previous fractures, LE neuropathy are also affecting patient's functional outcome.   REHAB POTENTIAL: Good  CLINICAL DECISION MAKING: Stable/uncomplicated  EVALUATION COMPLEXITY: Moderate   GOALS: Goals reviewed with patient? Yes  SHORT TERM GOALS: Target date: 01/10/2024    Pt to be independent with HEP. Baseline: Goal status: INITIAL  2.  Decrease pain by 1-2 levels. Baseline: 7/10 Goal status: INITIAL  LONG TERM GOALS: Target date: 02/21/2024  10weeks      Pt to be independent with self progressive HEP at time of discharge. Baseline:  Goal status: INITIAL  2.  Pt able to ambulate community distances without AD and no pain. Baseline: SPC Goal status: INITIAL  3.  Increase L LE strength by 1/2 grade to 1 full grade. Baseline: 4- Goal status: INITIAL  4.  Increase knee AROM to 0>105 active. Baseline: 5>90 Goal status: INITIAL  5.  Decrease max pain to 2/10 with all activities. Baseline: 7/10 Goal status: INITIAL  6.  Increase PSFS score  by at least 2-3 points to demonstrate measurable difference. Baseline: 5.66 Goal status: INITIAL   PLAN:  PT FREQUENCY: 2x/week  PT DURATION: 10  PLANNED INTERVENTIONS: 97164- PT Re-evaluation, 97110-Therapeutic exercises, 97530- Therapeutic activity, 97112- Neuromuscular re-education, 97535- Self Care, 02859- Manual therapy, [phone]- Gait training, [phone]- Aquatic Therapy, Patient/Family education, Balance training, Stair training, Joint mobilization, Scar mobilization, and Moist heat  PLAN FOR NEXT SESSION:Continue with exercises, mobility and balance exercises.   Burnard CHRISTELLA Meth, PT 12/21/2023, 2:19 PM

## 2023-12-26 ENCOUNTER — Encounter: Payer: Self-pay | Admitting: Physical Therapy

## 2023-12-26 ENCOUNTER — Ambulatory Visit (INDEPENDENT_AMBULATORY_CARE_PROVIDER_SITE_OTHER): Admitting: Physical Therapy

## 2023-12-26 DIAGNOSIS — M25562 Pain in left knee: Secondary | ICD-10-CM | POA: Diagnosis not present

## 2023-12-26 DIAGNOSIS — M6281 Muscle weakness (generalized): Secondary | ICD-10-CM | POA: Diagnosis not present

## 2023-12-26 DIAGNOSIS — M25662 Stiffness of left knee, not elsewhere classified: Secondary | ICD-10-CM

## 2023-12-26 DIAGNOSIS — R2689 Other abnormalities of gait and mobility: Secondary | ICD-10-CM | POA: Diagnosis not present

## 2023-12-26 DIAGNOSIS — G8929 Other chronic pain: Secondary | ICD-10-CM

## 2023-12-26 DIAGNOSIS — R2681 Unsteadiness on feet: Secondary | ICD-10-CM

## 2023-12-26 NOTE — Therapy (Incomplete)
 OUTPATIENT PHYSICAL THERAPY LOWER EXTREMITY TREATMENT   Patient Name: Jeffrey [name] MRN: [id number] DOB:1956-02-16, 68 y.o., male Today's Date: 12/26/2023  END OF SESSION:      Past Medical History:  Diagnosis Date   Aneurysm of infrarenal abdominal aorta    CAD (coronary artery disease)    4 stents   Carotid artery disease    Left ICA   Diabetes mellitus without complication (HCC)    Dysrhythmia    A. Fib   Hyperlipidemia    Intracerebral hemorrhage (HCC) 1960   Peripheral neuropathy    Post-traumatic osteoarthritis of left knee    Past Surgical History:  Procedure Laterality Date   APPENDECTOMY  1960   I & D EXTREMITY Right 06/19/2020   Procedure: IRRIGATION AND DEBRIDEMENT LONG FINGER;  Surgeon: Murrell Drivers, MD;  Location: [facility];  Service: Orthopedics;  Laterality: Right;   LEFT HEART CATH  06/20/2018   ORIF FEMUR FRACTURE  1988   ORIF FEMUR FRACTURE Left 10/03/2023   Procedure: OPEN REDUCTION INTERNAL FIXATION (ORIF) DISTAL FEMUR FRACTURE;  Surgeon: Celena Sharper, MD;  Location: [facility];  Service: Orthopedics;  Laterality: Left;   TOTAL KNEE ARTHROPLASTY Left 05/12/2023   Procedure: LEFT TOTAL KNEE ARTHROPLASTY;  Surgeon: Jerri Kay CHRISTELLA, MD;  Location: [facility];  Service: Orthopedics;  Laterality: Left;   VARICOSE VEIN SURGERY Right    right leg   Patient Active Problem List   Diagnosis Date Noted   Periprosthetic fracture around internal prosthetic left knee joint 10/02/2023   Supracondylar fracture of left femur (HCC) 10/02/2023   Closed left femoral fracture (HCC) 10/02/2023   Hypokalemia 10/02/2023   Status post total left knee replacement 05/12/2023   Post-traumatic osteoarthritis of left knee 01/31/2023   Atrial fibrillation (HCC) 11/26/2021   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  Rationale for Evaluation and Treatment: Rehabilitation  ONSET DATE: 10/03/23  SUBJECTIVE:   SUBJECTIVE STATEMENT:  ***His lateral thigh has been bothering him.  He has been doing his exercises.    PERTINENT HISTORY: L TKA 05/12/23,  L femur fx after fall 09/30/23 ORIF 10/03/23, this is 3rd L femur fx, CAD, DM, peripheral neuropathy  PAIN:  ***Are you having pain?   Yes: NPRS scale: sitting 0/10, standing 4-8 (quick spike when walking) /10  Pain location: left thigh distal - lateral  Pain description: dull, sharp Aggravating factors: walking, standing Relieving factors: rest, pain meds at night  PRECAUTIONS: Other: + L TKA  RED FLAGS: 3rd L femur fracture   WEIGHT BEARING RESTRICTIONS: Yes WBAT  FALLS:  Has patient fallen in last 6 months? Yes. Number of falls 1  LIVING ENVIRONMENT: Lives with: lives with their spouse Lives in: House/apartment Stairs: Yes: External: 4 steps; can reach both Has following equipment at home: Single point cane  OCCUPATION: retired   PLOF: Independent  PATIENT GOALS: decrease pain  NEXT MD VISIT: unknown  OBJECTIVE:  Note: Objective measures were completed at Evaluation unless otherwise noted.  DIAGNOSTIC FINDINGS: 11/08/23 X-rays demonstrate evidence of consolidation at the fracture site.  No  hardware complication.   PATIENT SURVEYS:  PSFS: THE PATIENT SPECIFIC  FUNCTIONAL SCALE  Place score of 0-10 (0 = unable to perform activity and 10 = able to perform activity at the same level as before injury or problem)  Activity Date: 12/13/23    walking 7    2.transfers 8    3.stairs without handrails 2    4.      Total Score 5.6      Total Score = Sum of activity scores/number of activities  Minimally Detectable Change:  3 points (for single activity); 2 points (for average score)  [employer] (nd). The Patient Specific Functional Scale . Retrieved from [email or web address]   COGNITION: Overall cognitive status: Within functional limits for tasks assessed     SENSATION: + hx neuropathy feet  EDEMA: ---   MUSCLE LENGTH: Hamstrings: Right mod restriction  deg; Left Right mod restriction    POSTURE: flexed trunk   PALPATION: 1+ tenderness at ant knee and at lateral incision  LOWER EXTREMITY ROM: hip PROM [facility]  A/P ROM Left eval  Hip flexion 100  Hip extension   Hip abduction 40  Hip adduction   Hip internal rotation   Hip external rotation   Knee flexion 90/100  Knee extension +5  Ankle dorsiflexion   Ankle plantarflexion   Ankle inversion   Ankle eversion    (Blank rows = not tested)  LOWER EXTREMITY MMT:  MMT Left eval  Hip flexion 4-  Hip extension 4-  Hip abduction 4-  Hip adduction   Hip internal rotation   Hip external rotation   Knee flexion 4  Knee extension 4-  Ankle dorsiflexion   Ankle plantarflexion   Ankle inversion   Ankle eversion    (Blank rows = not tested)  LOWER EXTREMITY SPECIAL TESTS: none   FUNCTIONAL TESTS:  5 times sit to stand: 15 sec with UE use and inc sway/neuropathy  GAIT: Distance walked: short community Assistive device utilized: Single point cane Level of assistance: Modified independence Comments: ---                                                                                                                                TODAY'S TREATMENT: 12/28/23***     12/26/2023 Therapeutic Exercise recumbent bike seat 9 level 1 for 8 min.  Hamstring stretch LLE long sit with strap DF 30 sec hold 3 reps Lateral hip stretch - SLR with strap & adduction 30 sec hold 3 reps LLE Seated hip internal rotation & external rotation 10 reps 2 sets each.   Therapeutic Activities   PT demo & verbal on technique for sit to/from stand.  Pt performed 10 reps with PT cues and intermittent tactile cues for stabilization.  PT recommended performing with table in front of him 10 reps after each meal focusing on technique.  Pt verbalized understanding.    Self-Care: PT demo & verbal cues on ice massage.   Pt verbalized & return demo understanding.  TREATMENT DATE: L Femur 12/21/23 There ex for ROM/strengthening: Rec bike lev 1  8 min LAQ 3# 3x10 SAQ 3x10 SLR #1.5 3x10 SR 3x10  There Act for transfers, stairs: Squats 2x10 Bridges 3x10 Seated heel raises 2x10  12/13/23 Initial evaluation completed followed by instruction and trial set of HEP.   PATIENT EDUCATION:  Education details: HEP Person educated: Patient Education method: Explanation, Demonstration, and Actor cues Education comprehension: verbalized understanding, returned demonstration, and verbal cues required  HOME EXERCISE PROGRAM: Access Code: [email or web address] URL: [email or web address] Date: 12/26/2023 Prepared by: Grayce Spatz  Exercises - Long Sitting Quad Set with Towel Roll Under Heel  - 2 x daily - 7 x weekly - 2 sets - 10 reps - 2 hold - Active Straight Leg Raise with Quad Set  - 2 x daily - 7 x weekly - 2 sets - 10 reps - Supine Heel Slides  - 2 x daily - 7 x weekly - 2 sets - 10 reps - 2 hold - Supine Bridge  - 2 x daily - 7 x weekly - 2 sets - 10 reps - 2 hold - Mini Squat with Counter Support  - 2 x daily - 7 x weekly - 2 sets - 10 reps - Seated Table Hamstring Stretch  - 1 x daily - 7 x weekly - 1 sets - 3 reps - 30 seconds hold - Supine ITB Stretch with Strap  - 1 x daily - 7 x weekly - 1 sets - 3 reps - 30 seconds hold - Seated Hip Internal Rotation AROM  - 1 x daily - 7 x weekly - 2 sets - 10 reps - 5 seconds hold - Seated Hip External Rotation AROM  - 1 x daily - 7 x weekly - 2 sets - 10 reps - 5 seconds hold  ASSESSMENT:  CLINICAL IMPRESSION: ***PT added  stretches & seated hip rotation to HEP which he appears to understand.  PT also worked on sit to/from Scientist, clinical (histocompatibility and immunogenetics) which he improved with instruction & repetition.  Patient continues to benefit from skilled PT.    OBJECTIVE IMPAIRMENTS: decreased endurance, decreased ROM, decreased strength, impaired flexibility, and pain.   ACTIVITY LIMITATIONS: squatting, stairs, and transfers  PARTICIPATION LIMITATIONS: community activity, church, and farming  PERSONAL FACTORS: 1-2 comorbidities: previous fractures, LE neuropathy are also affecting patient's functional outcome.   REHAB POTENTIAL: Good  CLINICAL DECISION MAKING: Stable/uncomplicated  EVALUATION COMPLEXITY: Moderate   GOALS: Goals reviewed with patient? Yes  SHORT TERM GOALS: Target date: 01/10/2024   Pt to be independent with HEP. Baseline: Goal status: Ongoing    12/26/2023  2.  Decrease pain by 1-2 levels. Baseline: 7/10 Goal status: Ongoing    12/26/2023  LONG TERM GOALS: Target date: 02/21/2024  10weeks      Pt to be independent with self progressive HEP at time of discharge. Baseline:  Goal status: Ongoing    12/26/2023  2.  Pt able to ambulate community distances without AD and no pain. Baseline: SPC Goal status: Ongoing    12/26/2023  3.  Increase L LE strength by 1/2 grade to 1 full grade. Baseline: 4- Goal status: Ongoing    12/26/2023  4.  Increase knee AROM to 0>105 active. Baseline: 5>90 Goal status: Ongoing    12/26/2023  5.  Decrease max pain to 2/10 with all activities. Baseline: 7/10 Goal status: Ongoing    12/26/2023  6.  Increase PSFS score  by at least 2-3 points to demonstrate measurable difference. Baseline:  5.66 Goal status: Ongoing    12/26/2023   PLAN:  PT FREQUENCY: 2x/week  PT DURATION: 10  PLANNED INTERVENTIONS: 02835- PT Re-evaluation, 97110-Therapeutic exercises, 97530- Therapeutic activity, 97112- Neuromuscular re-education, 97535- Self Care, 02859- Manual therapy,  [phone]- Gait training, [phone]- Aquatic Therapy, Patient/Family education, Balance training, Stair training, Joint mobilization, Scar mobilization, and Moist heat  PLAN FOR NEXT SESSION:   *** print & update HEP.  Check if ice massage is helping pain.  Manual therapy to lateral quad & scar,  Continue with exercises, mobility and balance exercises.   Burnard CHRISTELLA Meth, PT, DPT 12/26/2023, 2:00 PM

## 2023-12-26 NOTE — Therapy (Signed)
 OUTPATIENT PHYSICAL THERAPY LOWER EXTREMITY TREATMENT   Patient Name: Jeffrey [name] MRN: [id number] DOB:January 03, 1956, 68 y.o., male Today's Date: 12/26/2023  END OF SESSION:  PT End of Session - 12/26/23 1016     Visit Number 3    Number of Visits 21    Date for Recertification  02/21/24    PT Start Time 1016    PT Stop Time 1058    PT Time Calculation (min) 42 min    Activity Tolerance Patient tolerated treatment well    Behavior During Therapy WFL for tasks assessed/performed            Past Medical History:  Diagnosis Date   Aneurysm of infrarenal abdominal aorta    CAD (coronary artery disease)    4 stents   Carotid artery disease    Left ICA   Diabetes mellitus without complication (HCC)    Dysrhythmia    A. Fib   Hyperlipidemia    Intracerebral hemorrhage (HCC) 1960   Peripheral neuropathy    Post-traumatic osteoarthritis of left knee    Past Surgical History:  Procedure Laterality Date   APPENDECTOMY  1960   I & D EXTREMITY Right 06/19/2020   Procedure: IRRIGATION AND DEBRIDEMENT LONG FINGER;  Surgeon: Murrell Drivers, MD;  Location: [facility];  Service: Orthopedics;  Laterality: Right;   LEFT HEART CATH  06/20/2018   ORIF FEMUR FRACTURE  1988   ORIF FEMUR FRACTURE Left 10/03/2023   Procedure: OPEN REDUCTION INTERNAL FIXATION (ORIF) DISTAL FEMUR FRACTURE;  Surgeon: Celena Sharper, MD;  Location: [facility];  Service: Orthopedics;  Laterality: Left;   TOTAL KNEE ARTHROPLASTY Left 05/12/2023   Procedure: LEFT TOTAL KNEE ARTHROPLASTY;  Surgeon: Jerri Kay CHRISTELLA, MD;  Location: [facility];  Service: Orthopedics;  Laterality: Left;   VARICOSE VEIN SURGERY Right    right leg   Patient Active Problem List   Diagnosis Date Noted   Periprosthetic fracture around internal prosthetic left knee joint 10/02/2023   Supracondylar fracture of left femur (HCC) 10/02/2023   Closed left femoral fracture (HCC) 10/02/2023   Hypokalemia 10/02/2023   Status post total left knee replacement  05/12/2023   Post-traumatic osteoarthritis of left knee 01/31/2023   Atrial fibrillation (HCC) 11/26/2021   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   Occlusion and stenosis of carotid artery without mention of cerebral infarction 11/19/2013    [facility]: [facility]  REFERRING PROVIDER: Celena Sharper, MD , Ronal Grave PA  REFERRING DIAG: femur 15 visits  Closed fracture of left femur, unspecified fracture morphology, unspecified portion of femur, initial encounter (HCC)  - Primary S72.92XA    THERAPY DIAG:  Chronic pain of left knee  Muscle weakness (generalized)  Stiffness of left knee, not elsewhere classified  Other abnormalities of gait and mobility  Unsteadiness on feet  Rationale for Evaluation and Treatment: Rehabilitation  ONSET DATE: 10/03/23  SUBJECTIVE:   SUBJECTIVE STATEMENT:  His lateral thigh has been bothering him.  He has been doing his exercises.    PERTINENT HISTORY: L TKA 05/12/23,  L femur fx after fall 09/30/23 ORIF 10/03/23, this is 3rd L femur fx, CAD, DM, peripheral neuropathy  PAIN:  Are you having pain?   Yes: NPRS scale: sitting 0/10, standing 4-8 (quick spike when walking) /10  Pain location: left thigh distal - lateral  Pain description: dull, sharp Aggravating factors: walking, standing Relieving factors: rest, pain meds at night  PRECAUTIONS: Other: + L TKA  RED FLAGS: 3rd L femur fracture   WEIGHT BEARING  RESTRICTIONS: Yes WBAT  FALLS:  Has patient fallen in last 6 months? Yes. Number of falls 1  LIVING ENVIRONMENT: Lives with: lives with their spouse Lives in: House/apartment Stairs: Yes: External: 4 steps; can reach both Has following equipment at home: Single point cane  OCCUPATION: retired   PLOF: Independent  PATIENT GOALS: decrease pain  NEXT MD VISIT: unknown  OBJECTIVE:  Note: Objective measures were completed at Evaluation unless otherwise noted.  DIAGNOSTIC FINDINGS: 11/08/23 X-rays demonstrate evidence of  consolidation at the fracture site.  No  hardware complication.   PATIENT SURVEYS:  PSFS: THE PATIENT SPECIFIC FUNCTIONAL SCALE  Place score of 0-10 (0 = unable to perform activity and 10 = able to perform activity at the same level as before injury or problem)  Activity Date: 12/13/23    walking 7    2.transfers 8    3.stairs without handrails 2    4.      Total Score 5.6      Total Score = Sum of activity scores/number of activities  Minimally Detectable Change: 3 points (for single activity); 2 points (for average score)  [facility] (nd). The Patient Specific Functional Scale . Retrieved from [email or web address]   COGNITION: Overall cognitive status: Within functional limits for tasks assessed     SENSATION: + hx neuropathy feet  EDEMA: ---   MUSCLE LENGTH: Hamstrings: Right mod restriction  deg; Left Right mod restriction    POSTURE: flexed trunk   PALPATION: 1+ tenderness at ant knee and at lateral incision  LOWER EXTREMITY ROM: hip PROM [facility]  A/P ROM Left eval  Hip flexion 100  Hip extension   Hip abduction 40  Hip adduction   Hip internal rotation   Hip external rotation   Knee flexion 90/100  Knee extension +5  Ankle dorsiflexion   Ankle plantarflexion   Ankle inversion   Ankle eversion    (Blank rows = not tested)  LOWER EXTREMITY MMT:  MMT Left eval  Hip flexion 4-  Hip extension 4-  Hip abduction 4-  Hip adduction   Hip internal rotation   Hip external rotation   Knee flexion 4  Knee extension 4-  Ankle dorsiflexion   Ankle plantarflexion   Ankle inversion   Ankle eversion    (Blank rows = not tested)  LOWER EXTREMITY SPECIAL TESTS: none   FUNCTIONAL TESTS:  5 times sit to stand: 15 sec with UE use and inc sway/neuropathy  GAIT: Distance walked: short community Assistive device utilized: Single point cane Level of assistance: Modified independence Comments:  ---                                                                                                                                TODAY'S TREATMENT: 12/26/2023 Therapeutic Exercise recumbent bike seat 9 level 1 for 8 min.  Hamstring stretch LLE long sit with strap DF 30 sec hold 3 reps Lateral hip  stretch - SLR with strap & adduction 30 sec hold 3 reps LLE Seated hip internal rotation & external rotation 10 reps 2 sets each.   Therapeutic Activities  PT demo & verbal on technique for sit to/from stand.  Pt performed 10 reps with PT cues and intermittent tactile cues for stabilization.  PT recommended performing with table in front of him 10 reps after each meal focusing on technique.  Pt verbalized understanding.    Self-Care: PT demo & verbal cues on ice massage.   Pt verbalized & return demo understanding.      TREATMENT DATE: L Femur 12/21/23 There ex for ROM/strengthening: Rec bike lev 1  8 min LAQ 3# 3x10 SAQ 3x10 SLR #1.5 3x10 SR 3x10  There Act for transfers, stairs: Squats 2x10 Bridges 3x10 Seated heel raises 2x10  12/13/23 Initial evaluation completed followed by instruction and trial set of HEP.   PATIENT EDUCATION:  Education details: HEP Person educated: Patient Education method: Explanation, Demonstration, and Actor cues Education comprehension: verbalized understanding, returned demonstration, and verbal cues required  HOME EXERCISE PROGRAM: Access Code: [email or web address] URL: [email or web address] Date: 12/26/2023 Prepared by: Grayce Spatz  Exercises - Long Sitting Quad Set with Towel Roll Under Heel  - 2 x daily - 7 x weekly - 2 sets - 10 reps - 2 hold - Active Straight Leg Raise with Quad Set  - 2 x daily - 7 x weekly - 2 sets - 10 reps - Supine Heel Slides  - 2 x daily - 7 x weekly - 2 sets - 10 reps - 2 hold - Supine Bridge  - 2 x daily - 7 x weekly - 2 sets - 10 reps - 2 hold - Mini Squat with Counter Support  - 2 x daily - 7 x weekly  - 2 sets - 10 reps - Seated Table Hamstring Stretch  - 1 x daily - 7 x weekly - 1 sets - 3 reps - 30 seconds hold - Supine ITB Stretch with Strap  - 1 x daily - 7 x weekly - 1 sets - 3 reps - 30 seconds hold - Seated Hip Internal Rotation AROM  - 1 x daily - 7 x weekly - 2 sets - 10 reps - 5 seconds hold - Seated Hip External Rotation AROM  - 1 x daily - 7 x weekly - 2 sets - 10 reps - 5 seconds hold  ASSESSMENT:  CLINICAL IMPRESSION: PT added stretches & seated hip rotation to HEP which he appears to understand.  PT also worked on sit to/from Scientist, clinical (histocompatibility and immunogenetics) which he improved with instruction & repetition.  Patient continues to benefit from skilled PT.    OBJECTIVE IMPAIRMENTS: decreased endurance, decreased ROM, decreased strength, impaired flexibility, and pain.   ACTIVITY LIMITATIONS: squatting, stairs, and transfers  PARTICIPATION LIMITATIONS: community activity, church, and farming  PERSONAL FACTORS: 1-2 comorbidities: previous fractures, LE neuropathy are also affecting patient's functional outcome.   REHAB POTENTIAL: Good  CLINICAL DECISION MAKING: Stable/uncomplicated  EVALUATION COMPLEXITY: Moderate   GOALS: Goals reviewed with patient? Yes  SHORT TERM GOALS: Target date: 01/10/2024   Pt to be independent with HEP. Baseline: Goal status: Ongoing    12/26/2023  2.  Decrease pain by 1-2 levels. Baseline: 7/10 Goal status: Ongoing    12/26/2023  LONG TERM GOALS: Target date: 02/21/2024  10weeks      Pt to be independent with self progressive HEP at time of discharge. Baseline:  Goal status: Ongoing    12/26/2023  2.  Pt able to ambulate community distances without AD and no pain. Baseline: SPC Goal status: Ongoing    12/26/2023  3.  Increase L LE strength by 1/2 grade to 1 full grade. Baseline: 4- Goal status: Ongoing    12/26/2023  4.  Increase knee AROM to 0>105 active. Baseline: 5>90 Goal status: Ongoing    12/26/2023  5.  Decrease max pain to 2/10  with all activities. Baseline: 7/10 Goal status: Ongoing    12/26/2023  6.  Increase PSFS score  by at least 2-3 points to demonstrate measurable difference. Baseline: 5.66 Goal status: Ongoing    12/26/2023   PLAN:  PT FREQUENCY: 2x/week  PT DURATION: 10  PLANNED INTERVENTIONS: 02835- PT Re-evaluation, 97110-Therapeutic exercises, 97530- Therapeutic activity, 97112- Neuromuscular re-education, 97535- Self Care, 02859- Manual therapy, [phone]- Gait training, [phone]- Aquatic Therapy, Patient/Family education, Balance training, Stair training, Joint mobilization, Scar mobilization, and Moist heat  PLAN FOR NEXT SESSION:    print & update HEP.  Check if ice massage is helping pain.  Manual therapy to lateral quad & scar,  Continue with exercises, mobility and balance exercises.   Grayce Spatz, PT, DPT 12/26/2023, 1:05 PM

## 2023-12-28 ENCOUNTER — Encounter

## 2024-01-07 NOTE — Therapy (Signed)
 OUTPATIENT PHYSICAL THERAPY LOWER EXTREMITY TREATMENT   Patient Name: Jeffrey [name] MRN: [id number] DOB:[birth year], 68 y.o., male Today's Date: 01/08/2024  END OF SESSION:  PT End of Session - 01/08/24 1107     Visit Number 4    Number of Visits 21    Date for Recertification  02/21/24    PT Start Time 1100    PT Stop Time 1138    PT Time Calculation (min) 38 min    Activity Tolerance Patient tolerated treatment well    Behavior During Therapy WFL for tasks assessed/performed             Past Medical History:  Diagnosis Date   Aneurysm of infrarenal abdominal aorta    CAD (coronary artery disease)    4 stents   Carotid artery disease    Left ICA   Diabetes mellitus without complication (HCC)    Dysrhythmia    A. Fib   Hyperlipidemia    Intracerebral hemorrhage (HCC) 1960   Peripheral neuropathy    Post-traumatic osteoarthritis of left knee    Past Surgical History:  Procedure Laterality Date   APPENDECTOMY  1960   I & D EXTREMITY Right 06/19/2020   Procedure: IRRIGATION AND DEBRIDEMENT LONG FINGER;  Surgeon: Murrell Drivers, MD;  Location: [facility];  Service: Orthopedics;  Laterality: Right;   LEFT HEART CATH  06/20/2018   ORIF FEMUR FRACTURE  1988   ORIF FEMUR FRACTURE Left 10/03/2023   Procedure: OPEN REDUCTION INTERNAL FIXATION (ORIF) DISTAL FEMUR FRACTURE;  Surgeon: Celena Sharper, MD;  Location: [facility];  Service: Orthopedics;  Laterality: Left;   TOTAL KNEE ARTHROPLASTY Left 05/12/2023   Procedure: LEFT TOTAL KNEE ARTHROPLASTY;  Surgeon: Jerri Kay CHRISTELLA, MD;  Location: [facility];  Service: Orthopedics;  Laterality: Left;   VARICOSE VEIN SURGERY Right    right leg   Patient Active Problem List   Diagnosis Date Noted   Periprosthetic fracture around internal prosthetic left knee joint 10/02/2023   Supracondylar fracture of left femur (HCC) 10/02/2023   Closed left femoral fracture (HCC) 10/02/2023   Hypokalemia 10/02/2023   Status post total left knee replacement  05/12/2023   Post-traumatic osteoarthritis of left knee 01/31/2023   Atrial fibrillation (HCC) 11/26/2021   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   Occlusion and stenosis of carotid artery without mention of cerebral infarction 11/19/2013    [facility]  REFERRING PROVIDER: Celena Sharper, MD , Ronal Grave PA  REFERRING DIAG: femur 15 visits  Closed fracture of left femur, unspecified fracture morphology, unspecified portion of femur, initial encounter (HCC)  - Primary S72.92XA    THERAPY DIAG:  Chronic pain of left knee  Muscle weakness (generalized)  Stiffness of left knee, not elsewhere classified  Localized edema  Unsteadiness on feet  Other abnormalities of gait and mobility  Pain in left lower leg  Rationale for Evaluation and Treatment: Rehabilitation  ONSET DATE: 10/03/23  SUBJECTIVE:   SUBJECTIVE STATEMENT:  Pt reports his lateral thigh was aggravated by all the stairs at the vacation house.   PERTINENT HISTORY: L TKA 05/12/23,  L femur fx after fall 09/30/23 ORIF 10/03/23, this is 3rd L femur fx, CAD, DM, peripheral neuropathy  PAIN:  Are you having pain?   Yes: NPRS scale: sitting 0/10, sit to stand 8/10 Pain location: left thigh distal - lateral  Pain description: dull, sharp Aggravating factors: walking, standing Relieving factors: rest, pain meds at night  PRECAUTIONS: Other: + L TKA  RED FLAGS: 3rd L  femur fracture   WEIGHT BEARING RESTRICTIONS: Yes WBAT  FALLS:  Has patient fallen in last 6 months? Yes. Number of falls 1  LIVING ENVIRONMENT: Lives with: lives with their spouse Lives in: House/apartment Stairs: Yes: External: 4 steps; can reach both Has following equipment at home: Single point cane  OCCUPATION: retired   PLOF: Independent  PATIENT GOALS: decrease pain  NEXT MD VISIT: unknown  OBJECTIVE:  Note: Objective measures were completed at Evaluation unless otherwise noted.  DIAGNOSTIC FINDINGS: 11/08/23 X-rays  demonstrate evidence of consolidation at the fracture site.  No  hardware complication.   PATIENT SURVEYS:  PSFS: THE PATIENT SPECIFIC FUNCTIONAL SCALE  Place score of 0-10 (0 = unable to perform activity and 10 = able to perform activity at the same level as before injury or problem)  Activity Date: 12/13/23    walking 7    2.transfers 8    3.stairs without handrails 2    4.      Total Score 5.6      Total Score = Sum of activity scores/number of activities  Minimally Detectable Change: 3 points (for single activity); 2 points (for average score)  [employer] [facility] (nd). The Patient Specific Functional Scale . Retrieved from [email or web address]   COGNITION: Overall cognitive status: Within functional limits for tasks assessed     SENSATION: + hx neuropathy feet  EDEMA: ---   MUSCLE LENGTH: Hamstrings: Right mod restriction  deg; Left Right mod restriction    POSTURE: flexed trunk   PALPATION: 1+ tenderness at ant knee and at lateral incision  LOWER EXTREMITY ROM: hip PROM [facility]  A/P ROM Left eval  Hip flexion 100  Hip extension   Hip abduction 40  Hip adduction   Hip internal rotation   Hip external rotation   Knee flexion 90/100  Knee extension +5  Ankle dorsiflexion   Ankle plantarflexion   Ankle inversion   Ankle eversion    (Blank rows = not tested)  LOWER EXTREMITY MMT:  MMT Left eval  Hip flexion 4-  Hip extension 4-  Hip abduction 4-  Hip adduction   Hip internal rotation   Hip external rotation   Knee flexion 4  Knee extension 4-  Ankle dorsiflexion   Ankle plantarflexion   Ankle inversion   Ankle eversion    (Blank rows = not tested)  LOWER EXTREMITY SPECIAL TESTS: none   FUNCTIONAL TESTS:  5 times sit to stand: 15 sec with UE use and inc sway/neuropathy  GAIT: Distance walked: short community Assistive device utilized: Single point cane Level of assistance: Modified  independence Comments: ---                                                                                                                                TODAY'S TREATMENT: L LE 01/08/24 Therapeutic Exercise Recumbent bike seat 9 level 1 for 9 min.  Hamstring stretch LLE long sit with strap  DF 30 sec hold 3 reps Lateral hip stretch - SLR with strap & adduction 30 sec hold 3 reps LLE Seated hip internal rotation & external rotation 10 reps 2 sets each.   Therapeutic Activities  Seated marching 2x10 Seated hip adduction ball squeeze 10x10 sec Manual  Percussive device to L gluteal band TFL     12/26/2023 Therapeutic Exercise recumbent bike seat 9 level 1 for 8 min.  Hamstring stretch LLE long sit with strap DF 30 sec hold 3 reps Lateral hip stretch - SLR with strap & adduction 30 sec hold 3 reps LLE Seated hip internal rotation & external rotation 10 reps 2 sets each.   Therapeutic Activities  PT demo & verbal on technique for sit to/from stand.  Pt performed 10 reps with PT cues and intermittent tactile cues for stabilization.  PT recommended performing with table in front of him 10 reps after each meal focusing on technique.  Pt verbalized understanding.    Self-Care: PT demo & verbal cues on ice massage.   Pt verbalized & return demo understanding.      TREATMENT DATE: L Femur 12/21/23 There ex for ROM/strengthening: Rec bike lev 1  8 min LAQ 3# 3x10 SAQ 3x10 SLR #1.5 3x10 SR 3x10  There Act for transfers, stairs: Squats 2x10 Bridges 3x10 Seated heel raises 2x10  12/13/23 Initial evaluation completed followed by instruction and trial set of HEP.   PATIENT EDUCATION:  Education details: HEP Person educated: Patient Education method: Explanation, Demonstration, and Actor cues Education comprehension: verbalized understanding, returned demonstration, and verbal cues required  HOME EXERCISE PROGRAM: Access Code: [email or web address] URL:  [email or web address] Date: 12/26/2023 Prepared by: Grayce Spatz  Exercises - Long Sitting Quad Set with Towel Roll Under Heel  - 2 x daily - 7 x weekly - 2 sets - 10 reps - 2 hold - Active Straight Leg Raise with Quad Set  - 2 x daily - 7 x weekly - 2 sets - 10 reps - Supine Heel Slides  - 2 x daily - 7 x weekly - 2 sets - 10 reps - 2 hold - Supine Bridge  - 2 x daily - 7 x weekly - 2 sets - 10 reps - 2 hold - Mini Squat with Counter Support  - 2 x daily - 7 x weekly - 2 sets - 10 reps - Seated Table Hamstring Stretch  - 1 x daily - 7 x weekly - 1 sets - 3 reps - 30 seconds hold - Supine ITB Stretch with Strap  - 1 x daily - 7 x weekly - 1 sets - 3 reps - 30 seconds hold - Seated Hip Internal Rotation AROM  - 1 x daily - 7 x weekly - 2 sets - 10 reps - 5 seconds hold - Seated Hip External Rotation AROM  - 1 x daily - 7 x weekly - 2 sets - 10 reps - 5 seconds hold  ASSESSMENT:  CLINICAL IMPRESSION: Pt reported decreased pain in LLE with ambulation after percussive device.  Held off on sit to stand due to increased pain.  OBJECTIVE IMPAIRMENTS: decreased endurance, decreased ROM, decreased strength, impaired flexibility, and pain.   ACTIVITY LIMITATIONS: squatting, stairs, and transfers  PARTICIPATION LIMITATIONS: community activity, church, and farming  PERSONAL FACTORS: 1-2 comorbidities: previous fractures, LE neuropathy are also affecting patient's functional outcome.   REHAB POTENTIAL: Good  CLINICAL DECISION MAKING: Stable/uncomplicated  EVALUATION COMPLEXITY: Moderate   GOALS: Goals reviewed with patient? Yes  SHORT TERM GOALS:  Target date: 01/10/2024   Pt to be independent with HEP. Baseline: Goal status: MET 01/08/24  2.  Decrease pain by 1-2 levels. Baseline: 7/10 Goal status: Ongoing   01/08/24  LONG TERM GOALS: Target date: 02/21/2024  10weeks      Pt to be independent with self progressive HEP at time of discharge. Baseline:  Goal status:  Ongoing    12/26/2023  2.  Pt able to ambulate community distances without AD and no pain. Baseline: SPC Goal status: Ongoing    12/26/2023  3.  Increase L LE strength by 1/2 grade to 1 full grade. Baseline: 4- Goal status: Ongoing    12/26/2023  4.  Increase knee AROM to 0>105 active. Baseline: 5>90 Goal status: Ongoing    12/26/2023  5.  Decrease max pain to 2/10 with all activities. Baseline: 7/10 Goal status: Ongoing    12/26/2023  6.  Increase PSFS score  by at least 2-3 points to demonstrate measurable difference. Baseline: 5.66 Goal status: Ongoing    12/26/2023   PLAN:  PT FREQUENCY: 2x/week  PT DURATION: 10  PLANNED INTERVENTIONS: 02835- PT Re-evaluation, 97110-Therapeutic exercises, 97530- Therapeutic activity, 97112- Neuromuscular re-education, 97535- Self Care, 02859- Manual therapy, [phone]- Gait training, [phone]- Aquatic Therapy, Patient/Family education, Balance training, Stair training, Joint mobilization, Scar mobilization, and Moist heat  PLAN FOR NEXT SESSION:  Manual therapy to lateral quad & scar,  Continue with exercises, mobility and balance exercises.   Burnard CHRISTELLA Meth, PT, 01/08/2024, 11:09 AM

## 2024-01-08 ENCOUNTER — Ambulatory Visit (INDEPENDENT_AMBULATORY_CARE_PROVIDER_SITE_OTHER)

## 2024-01-08 ENCOUNTER — Encounter: Payer: Self-pay | Admitting: Radiology

## 2024-01-08 DIAGNOSIS — M79662 Pain in left lower leg: Secondary | ICD-10-CM

## 2024-01-08 DIAGNOSIS — M6281 Muscle weakness (generalized): Secondary | ICD-10-CM

## 2024-01-08 DIAGNOSIS — M25562 Pain in left knee: Secondary | ICD-10-CM

## 2024-01-08 DIAGNOSIS — G8929 Other chronic pain: Secondary | ICD-10-CM

## 2024-01-08 DIAGNOSIS — M25662 Stiffness of left knee, not elsewhere classified: Secondary | ICD-10-CM

## 2024-01-08 DIAGNOSIS — R600 Localized edema: Secondary | ICD-10-CM | POA: Diagnosis not present

## 2024-01-08 DIAGNOSIS — R2681 Unsteadiness on feet: Secondary | ICD-10-CM

## 2024-01-08 DIAGNOSIS — R2689 Other abnormalities of gait and mobility: Secondary | ICD-10-CM

## 2024-01-09 NOTE — Therapy (Signed)
 OUTPATIENT PHYSICAL THERAPY LOWER EXTREMITY TREATMENT   Patient Name: Jeffrey [name] MRN: [id number] DOB:[birth year], 68 y.o., male Today's Date: 01/10/2024  END OF SESSION:  PT End of Session - 01/10/24 1051     Visit Number 5    Number of Visits 21    Date for Recertification  02/21/24    PT Start Time 1010    PT Stop Time 1048    PT Time Calculation (min) 38 min    Activity Tolerance Patient tolerated treatment well    Behavior During Therapy WFL for tasks assessed/performed              Past Medical History:  Diagnosis Date   Aneurysm of infrarenal abdominal aorta    CAD (coronary artery disease)    4 stents   Carotid artery disease    Left ICA   Diabetes mellitus without complication (HCC)    Dysrhythmia    A. Fib   Hyperlipidemia    Intracerebral hemorrhage (HCC) 1960   Peripheral neuropathy    Post-traumatic osteoarthritis of left knee    Past Surgical History:  Procedure Laterality Date   APPENDECTOMY  1960   I & D EXTREMITY Right 06/19/2020   Procedure: IRRIGATION AND DEBRIDEMENT LONG FINGER;  Surgeon: Murrell Drivers, MD;  Location: [facility];  Service: Orthopedics;  Laterality: Right;   LEFT HEART CATH  06/20/2018   ORIF FEMUR FRACTURE  1988   ORIF FEMUR FRACTURE Left 10/03/2023   Procedure: OPEN REDUCTION INTERNAL FIXATION (ORIF) DISTAL FEMUR FRACTURE;  Surgeon: Celena Sharper, MD;  Location: [facility];  Service: Orthopedics;  Laterality: Left;   TOTAL KNEE ARTHROPLASTY Left 05/12/2023   Procedure: LEFT TOTAL KNEE ARTHROPLASTY;  Surgeon: Jerri Kay CHRISTELLA, MD;  Location: [facility];  Service: Orthopedics;  Laterality: Left;   VARICOSE VEIN SURGERY Right    right leg   Patient Active Problem List   Diagnosis Date Noted   Periprosthetic fracture around internal prosthetic left knee joint 10/02/2023   Supracondylar fracture of left femur (HCC) 10/02/2023   Closed left femoral fracture (HCC) 10/02/2023   Hypokalemia 10/02/2023   Status post total left knee replacement  05/12/2023   Post-traumatic osteoarthritis of left knee 01/31/2023   Atrial fibrillation (HCC) 11/26/2021   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   Occlusion and stenosis of carotid artery without mention of cerebral infarction 11/19/2013    [facility]  REFERRING PROVIDER: Celena Sharper, MD , Ronal Grave PA  REFERRING DIAG: femur 15 visits  Closed fracture of left femur, unspecified fracture morphology, unspecified portion of femur, initial encounter (HCC)  - Primary S72.92XA    THERAPY DIAG:  Chronic pain of left knee  Muscle weakness (generalized)  Stiffness of left knee, not elsewhere classified  Localized edema  Unsteadiness on feet  Other abnormalities of gait and mobility  Pain in left lower leg  Rationale for Evaluation and Treatment: Rehabilitation  ONSET DATE: 10/03/23  SUBJECTIVE:   SUBJECTIVE STATEMENT:  Pt reports his thigh muscles are feeling better. PERTINENT HISTORY: L TKA 05/12/23,  L femur fx after fall 09/30/23 ORIF 10/03/23, this is 3rd L femur fx, CAD, DM, peripheral neuropathy  PAIN:  Are you having pain?   Yes: NPRS scale: sitting 0/10, sit to stand 3/10 Pain location: left thigh distal - lateral  Pain description: dull, sharp Aggravating factors: walking, standing Relieving factors: rest, pain meds at night  PRECAUTIONS: Other: + L TKA  RED FLAGS: 3rd L femur fracture   WEIGHT BEARING RESTRICTIONS: Yes  WBAT  FALLS:  Has patient fallen in last 6 months? Yes. Number of falls 1  LIVING ENVIRONMENT: Lives with: lives with their spouse Lives in: House/apartment Stairs: Yes: External: 4 steps; can reach both Has following equipment at home: Single point cane  OCCUPATION: retired   PLOF: Independent  PATIENT GOALS: decrease pain  NEXT MD VISIT: unknown  OBJECTIVE:  Note: Objective measures were completed at Evaluation unless otherwise noted.  DIAGNOSTIC FINDINGS: 11/08/23 X-rays demonstrate evidence of consolidation at  the fracture site.  No  hardware complication.   PATIENT SURVEYS:  PSFS: THE PATIENT SPECIFIC FUNCTIONAL SCALE  Place score of 0-10 (0 = unable to perform activity and 10 = able to perform activity at the same level as before injury or problem)  Activity Date: 12/13/23    walking 7    2.transfers 8    3.stairs without handrails 2    4.      Total Score 5.6      Total Score = Sum of activity scores/number of activities  Minimally Detectable Change: 3 points (for single activity); 2 points (for average score)  [facility] (nd). The Patient Specific Functional Scale . Retrieved from [email or web address].[email or web address]   COGNITION: Overall cognitive status: Within functional limits for tasks assessed     SENSATION: + hx neuropathy feet  EDEMA: ---   MUSCLE LENGTH: Hamstrings: Right mod restriction  deg; Left Right mod restriction    POSTURE: flexed trunk   PALPATION: 1+ tenderness at ant knee and at lateral incision  LOWER EXTREMITY ROM: hip PROM [facility]  A/P ROM Left eval  Hip flexion 100  Hip extension   Hip abduction 40  Hip adduction   Hip internal rotation   Hip external rotation   Knee flexion 90/100  Knee extension +5  Ankle dorsiflexion   Ankle plantarflexion   Ankle inversion   Ankle eversion    (Blank rows = not tested)  LOWER EXTREMITY MMT:  MMT Left eval  Hip flexion 4-  Hip extension 4-  Hip abduction 4-  Hip adduction   Hip internal rotation   Hip external rotation   Knee flexion 4  Knee extension 4-  Ankle dorsiflexion   Ankle plantarflexion   Ankle inversion   Ankle eversion    (Blank rows = not tested)  LOWER EXTREMITY SPECIAL TESTS: none   FUNCTIONAL TESTS:  5 times sit to stand: 15 sec with UE use and inc sway/neuropathy  GAIT: Distance walked: short community Assistive device utilized: Single point cane Level of assistance: Modified independence Comments: ---                                                                                                                                 TODAY'S TREATMENT:   L LE 01/10/24 Therapeutic Exercise Recumbent bike seat 9 level 1 for 9 min.  Hamstring stretch LLE long sit with strap DF 30 sec hold 3 reps  Lateral hip stretch - SLR with strap & adduction 30 sec hold 3 reps LLE Seated hip internal rotation & external rotation 10 reps 2 sets each.  L LE LAQ 3# 3x10 SAQ 4# 3x10 Side lying (R )clamshells 3x10 Therapeutic Activities  Seated marching 2x10 Seated hip adduction ball squeeze 10x10 sec Step 8 taps forward and lateral 12x each Step onto 6 step 2x10 with use of bilateral hand rails   01/08/24 Therapeutic Exercise Recumbent bike seat 9 level 1 for 9 min.  Hamstring stretch LLE long sit with strap DF 30 sec hold 3 reps Lateral hip stretch - SLR with strap & adduction 30 sec hold 3 reps LLE Seated hip internal rotation & external rotation 10 reps 2 sets each.   Therapeutic Activities  Seated marching 2x10 Seated hip adduction ball squeeze 10x10 sec Manual  Percussive device to L gluteal band TFL     12/26/2023 Therapeutic Exercise recumbent bike seat 9 level 1 for 8 min.  Hamstring stretch LLE long sit with strap DF 30 sec hold 3 reps Lateral hip stretch - SLR with strap & adduction 30 sec hold 3 reps LLE Seated hip internal rotation & external rotation 10 reps 2 sets each.   Therapeutic Activities  PT demo & verbal on technique for sit to/from stand.  Pt performed 10 reps with PT cues and intermittent tactile cues for stabilization.  PT recommended performing with table in front of him 10 reps after each meal focusing on technique.  Pt verbalized understanding.    Self-Care: PT demo & verbal cues on ice massage.   Pt verbalized & return demo understanding.      TREATMENT DATE: L Femur 12/21/23 There ex for ROM/strengthening: Rec bike lev 1  8 min LAQ 3# 3x10 SAQ 3x10 SLR #1.5 3x10 SR  3x10  There Act for transfers, stairs: Squats 2x10 Bridges 3x10 Seated heel raises 2x10  12/13/23 Initial evaluation completed followed by instruction and trial set of HEP.   PATIENT EDUCATION:  Education details: HEP Person educated: Patient Education method: Explanation, Demonstration, and Actor cues Education comprehension: verbalized understanding, returned demonstration, and verbal cues required  HOME EXERCISE PROGRAM: Access Code: [email or web address] URL: [email or web address] Date: 12/26/2023 Prepared by: Grayce Spatz  Exercises - Long Sitting Quad Set with Towel Roll Under Heel  - 2 x daily - 7 x weekly - 2 sets - 10 reps - 2 hold - Active Straight Leg Raise with Quad Set  - 2 x daily - 7 x weekly - 2 sets - 10 reps - Supine Heel Slides  - 2 x daily - 7 x weekly - 2 sets - 10 reps - 2 hold - Supine Bridge  - 2 x daily - 7 x weekly - 2 sets - 10 reps - 2 hold - Mini Squat with Counter Support  - 2 x daily - 7 x weekly - 2 sets - 10 reps - Seated Table Hamstring Stretch  - 1 x daily - 7 x weekly - 1 sets - 3 reps - 30 seconds hold - Supine ITB Stretch with Strap  - 1 x daily - 7 x weekly - 1 sets - 3 reps - 30 seconds hold - Seated Hip Internal Rotation AROM  - 1 x daily - 7 x weekly - 2 sets - 10 reps - 5 seconds hold - Seated Hip External Rotation AROM  - 1 x daily - 7 x weekly - 2 sets - 10 reps - 5 seconds hold  ASSESSMENT:  CLINICAL IMPRESSION: Pt demonstrates weakness of L quadriceps/hip flexors with inability to step up onto 8' step leading with the L.  Able to perform step up on 6 step with use of bars. OBJECTIVE IMPAIRMENTS: decreased endurance, decreased ROM, decreased strength, impaired flexibility, and pain.   ACTIVITY LIMITATIONS: squatting, stairs, and transfers  PARTICIPATION LIMITATIONS: community activity, church, and farming  PERSONAL FACTORS: 1-2 comorbidities: previous fractures, LE neuropathy are also affecting patient's functional outcome.    REHAB POTENTIAL: Good  CLINICAL DECISION MAKING: Stable/uncomplicated  EVALUATION COMPLEXITY: Moderate   GOALS: Goals reviewed with patient? Yes  SHORT TERM GOALS: Target date: 01/10/2024   Pt to be independent with HEP. Baseline: Goal status: MET 01/08/24  2.  Decrease pain by 1-2 levels. Baseline: 7/10 Goal status: MET  01/10/24  LONG TERM GOALS: Target date: 02/21/2024  10weeks      Pt to be independent with self progressive HEP at time of discharge. Baseline:  Goal status: Ongoing    12/26/2023  2.  Pt able to ambulate community distances without AD and no pain. Baseline: SPC Goal status: Ongoing    12/26/2023  3.  Increase L LE strength by 1/2 grade to 1 full grade. Baseline: 4- Goal status: Ongoing    12/26/2023  4.  Increase knee AROM to 0>105 active. Baseline: 5>90 Goal status: Ongoing    12/26/2023  5.  Decrease max pain to 2/10 with all activities. Baseline: 7/10 Goal status: Ongoing    12/26/2023  6.  Increase PSFS score  by at least 2-3 points to demonstrate measurable difference. Baseline: 5.66 Goal status: Ongoing    12/26/2023   PLAN:  PT FREQUENCY: 2x/week  PT DURATION: 10  PLANNED INTERVENTIONS: 02835- PT Re-evaluation, 97110-Therapeutic exercises, 97530- Therapeutic activity, 97112- Neuromuscular re-education, 97535- Self Care, 02859- Manual therapy, [phone]- Gait training, [phone]- Aquatic Therapy, Patient/Family education, Balance training, Stair training, Joint mobilization, Scar mobilization, and Moist heat  PLAN FOR NEXT SESSION:   Continue with exercises, mobility and balance exercises.   Burnard CHRISTELLA Meth, PT, 01/10/2024, 10:52 AM

## 2024-01-10 ENCOUNTER — Ambulatory Visit (INDEPENDENT_AMBULATORY_CARE_PROVIDER_SITE_OTHER)

## 2024-01-10 DIAGNOSIS — R600 Localized edema: Secondary | ICD-10-CM

## 2024-01-10 DIAGNOSIS — M25662 Stiffness of left knee, not elsewhere classified: Secondary | ICD-10-CM

## 2024-01-10 DIAGNOSIS — M6281 Muscle weakness (generalized): Secondary | ICD-10-CM

## 2024-01-10 DIAGNOSIS — R2689 Other abnormalities of gait and mobility: Secondary | ICD-10-CM

## 2024-01-10 DIAGNOSIS — R2681 Unsteadiness on feet: Secondary | ICD-10-CM

## 2024-01-10 DIAGNOSIS — M25562 Pain in left knee: Secondary | ICD-10-CM

## 2024-01-10 DIAGNOSIS — M79662 Pain in left lower leg: Secondary | ICD-10-CM

## 2024-01-10 DIAGNOSIS — G8929 Other chronic pain: Secondary | ICD-10-CM

## 2024-01-15 ENCOUNTER — Encounter: Admitting: Physical Therapy

## 2024-01-15 NOTE — Therapy (Signed)
 OUTPATIENT PHYSICAL THERAPY LOWER EXTREMITY TREATMENT   Patient Name: Jeffrey [name] MRN: [id number] DOB:09-22-1955, 68 y.o., male Today's Date: 01/16/2024  END OF SESSION:  PT End of Session - 01/16/24 1049     Visit Number 6    Number of Visits 21    Date for Recertification  02/21/24    PT Start Time 1015    PT Stop Time 1053    PT Time Calculation (min) 38 min    Activity Tolerance Patient tolerated treatment well    Behavior During Therapy WFL for tasks assessed/performed               Past Medical History:  Diagnosis Date   Aneurysm of infrarenal abdominal aorta    CAD (coronary artery disease)    4 stents   Carotid artery disease    Left ICA   Diabetes mellitus without complication (HCC)    Dysrhythmia    A. Fib   Hyperlipidemia    Intracerebral hemorrhage (HCC) 1960   Peripheral neuropathy    Post-traumatic osteoarthritis of left knee    Past Surgical History:  Procedure Laterality Date   APPENDECTOMY  1960   I & D EXTREMITY Right 06/19/2020   Procedure: IRRIGATION AND DEBRIDEMENT LONG FINGER;  Surgeon: Murrell Drivers, MD;  Location: [facility];  Service: Orthopedics;  Laterality: Right;   LEFT HEART CATH  06/20/2018   ORIF FEMUR FRACTURE  1988   ORIF FEMUR FRACTURE Left 10/03/2023   Procedure: OPEN REDUCTION INTERNAL FIXATION (ORIF) DISTAL FEMUR FRACTURE;  Surgeon: Celena Sharper, MD;  Location: [facility];  Service: Orthopedics;  Laterality: Left;   TOTAL KNEE ARTHROPLASTY Left 05/12/2023   Procedure: LEFT TOTAL KNEE ARTHROPLASTY;  Surgeon: Jerri Kay CHRISTELLA, MD;  Location: [facility];  Service: Orthopedics;  Laterality: Left;   VARICOSE VEIN SURGERY Right    right leg   Patient Active Problem List   Diagnosis Date Noted   Periprosthetic fracture around internal prosthetic left knee joint 10/02/2023   Supracondylar fracture of left femur (HCC) 10/02/2023   Closed left femoral fracture (HCC) 10/02/2023   Hypokalemia 10/02/2023   Status post total left knee  replacement 05/12/2023   Post-traumatic osteoarthritis of left knee 01/31/2023   Atrial fibrillation (HCC) 11/26/2021   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   Occlusion and stenosis of carotid artery without mention of cerebral infarction 11/19/2013    [facility]  REFERRING PROVIDER: Celena Sharper, MD , Ronal Grave PA  REFERRING DIAG: femur 15 visits  Closed fracture of left femur, unspecified fracture morphology, unspecified portion of femur, initial encounter (HCC)  - Primary S72.92XA    THERAPY DIAG:  Chronic pain of left knee  Muscle weakness (generalized)  Stiffness of left knee, not elsewhere classified  Localized edema  Unsteadiness on feet  Other abnormalities of gait and mobility  Pain in left lower leg  Rationale for Evaluation and Treatment: Rehabilitation  ONSET DATE: 10/03/23  SUBJECTIVE:   SUBJECTIVE STATEMENT: Pt states the L thigh is stiffness.   PERTINENT HISTORY: L TKA 05/12/23,  L femur fx after fall 09/30/23 ORIF 10/03/23, this is 3rd L femur fx, CAD, DM, peripheral neuropathy  PAIN:  Are you having pain?   Yes: NPRS scale: sitting 0/10, sit to stand 3/10 Pain location: left thigh distal - lateral  Pain description: dull, sharp Aggravating factors: walking, standing Relieving factors: rest, pain meds at night  PRECAUTIONS: Other: + L TKA  RED FLAGS: 3rd L femur fracture   WEIGHT BEARING RESTRICTIONS:  Yes WBAT  FALLS:  Has patient fallen in last 6 months? Yes. Number of falls 1  LIVING ENVIRONMENT: Lives with: lives with their spouse Lives in: House/apartment Stairs: Yes: External: 4 steps; can reach both Has following equipment at home: Single point cane  OCCUPATION: retired   PLOF: Independent  PATIENT GOALS: decrease pain  NEXT MD VISIT: unknown  OBJECTIVE:  Note: Objective measures were completed at Evaluation unless otherwise noted.  DIAGNOSTIC FINDINGS: 11/08/23 X-rays demonstrate evidence of consolidation  at the fracture site.  No  hardware complication.   PATIENT SURVEYS:  PSFS: THE PATIENT SPECIFIC FUNCTIONAL SCALE  Place score of 0-10 (0 = unable to perform activity and 10 = able to perform activity at the same level as before injury or problem)  Activity Date: 12/13/23    walking 7    2.transfers 8    3.stairs without handrails 2    4.      Total Score 5.6      Total Score = Sum of activity scores/number of activities  Minimally Detectable Change: 3 points (for single activity); 2 points (for average score)  [facility] (nd). The Patient Specific Functional Scale . Retrieved from [email or web address]   COGNITION: Overall cognitive status: Within functional limits for tasks assessed     SENSATION: + hx neuropathy feet  EDEMA: ---   MUSCLE LENGTH: Hamstrings: Right mod restriction  deg; Left Right mod restriction    POSTURE: flexed trunk   PALPATION: 1+ tenderness at ant knee and at lateral incision  LOWER EXTREMITY ROM: hip PROM [facility]  A/P ROM Left eval  Hip flexion 100  Hip extension   Hip abduction 40  Hip adduction   Hip internal rotation   Hip external rotation   Knee flexion 90/100  Knee extension +5  Ankle dorsiflexion   Ankle plantarflexion   Ankle inversion   Ankle eversion    (Blank rows = not tested)  LOWER EXTREMITY MMT:  MMT Left eval  Hip flexion 4-  Hip extension 4-  Hip abduction 4-  Hip adduction   Hip internal rotation   Hip external rotation   Knee flexion 4  Knee extension 4-  Ankle dorsiflexion   Ankle plantarflexion   Ankle inversion   Ankle eversion    (Blank rows = not tested)  LOWER EXTREMITY SPECIAL TESTS: none   FUNCTIONAL TESTS:  5 times sit to stand: 15 sec with UE use and inc sway/neuropathy  GAIT: Distance walked: short community Assistive device utilized: Single point cane Level of assistance: Modified independence Comments: ---                                                                                                                                 TODAY'S TREATMENT:   L LE 01/16/24 Therapeutic Exercise Nustep level 5  for 9 min.  Hamstring stretch LLE long sit with strap DF 30 sec hold 3 reps Lateral  hip stretch - SLR with strap & adduction 30 sec hold 3 reps LLE Seated hip internal rotation & external rotation 10 reps 2 sets each.  L LE LAQ 4# 3x10 SAQ 4# 3x10 Side lying clamshells 3x10 Therapeutic Activities  Seated marching 2x10 Seated hip adduction ball squeeze 10x10 sec Step 8 taps forward and lateral 12x each Step onto 4 step 2x10 1 set with R hand only, 1 set with no hand use     01/10/24 Therapeutic Exercise Recumbent bike seat 9 level 1 for 9 min.  Hamstring stretch LLE long sit with strap DF 30 sec hold 3 reps Lateral hip stretch - SLR with strap & adduction 30 sec hold 3 reps LLE Seated hip internal rotation & external rotation 10 reps 2 sets each.  L LE LAQ 3# 3x10 SAQ 4# 3x10 Side lying clamshells 3x10 Therapeutic Activities  Seated marching 2x10 Seated hip adduction ball squeeze 10x10 sec Step 8 taps forward and lateral 12x each Step onto 6 step 2x10 with use of bilateral hand rails   01/08/24 Therapeutic Exercise Recumbent bike seat 9 level 1 for 9 min.  Hamstring stretch LLE long sit with strap DF 30 sec hold 3 reps Lateral hip stretch - SLR with strap & adduction 30 sec hold 3 reps LLE Seated hip internal rotation & external rotation 10 reps 2 sets each.   Therapeutic Activities  Seated marching 2x10 Seated hip adduction ball squeeze 10x10 sec Manual  Percussive device to L gluteal band TFL     12/26/2023 Therapeutic Exercise recumbent bike seat 9 level 1 for 8 min.  Hamstring stretch LLE long sit with strap DF 30 sec hold 3 reps Lateral hip stretch - SLR with strap & adduction 30 sec hold 3 reps LLE Seated hip internal rotation & external rotation 10 reps 2 sets each.    Therapeutic Activities  PT demo & verbal on technique for sit to/from stand.  Pt performed 10 reps with PT cues and intermittent tactile cues for stabilization.  PT recommended performing with table in front of him 10 reps after each meal focusing on technique.  Pt verbalized understanding.    Self-Care: PT demo & verbal cues on ice massage.   Pt verbalized & return demo understanding.      TREATMENT DATE: L Femur 12/21/23 There ex for ROM/strengthening: Rec bike lev 1  8 min LAQ 3# 3x10 SAQ 3x10 SLR #1.5 3x10 SR 3x10  There Act for transfers, stairs: Squats 2x10 Bridges 3x10 Seated heel raises 2x10  12/13/23 Initial evaluation completed followed by instruction and trial set of HEP.   PATIENT EDUCATION:  Education details: HEP Person educated: Patient Education method: Explanation, Demonstration, and Actor cues Education comprehension: verbalized understanding, returned demonstration, and verbal cues required  HOME EXERCISE PROGRAM: Access Code: [email or web address] URL: [email or web address] Date: 12/26/2023 Prepared by: Grayce Spatz  Exercises - Long Sitting Quad Set with Towel Roll Under Heel  - 2 x daily - 7 x weekly - 2 sets - 10 reps - 2 hold - Active Straight Leg Raise with Quad Set  - 2 x daily - 7 x weekly - 2 sets - 10 reps - Supine Heel Slides  - 2 x daily - 7 x weekly - 2 sets - 10 reps - 2 hold - Supine Bridge  - 2 x daily - 7 x weekly - 2 sets - 10 reps - 2 hold - Mini Squat with Counter Support  - 2 x daily - 7 x weekly -  2 sets - 10 reps - Seated Table Hamstring Stretch  - 1 x daily - 7 x weekly - 1 sets - 3 reps - 30 seconds hold - Supine ITB Stretch with Strap  - 1 x daily - 7 x weekly - 1 sets - 3 reps - 30 seconds hold - Seated Hip Internal Rotation AROM  - 1 x daily - 7 x weekly - 2 sets - 10 reps - 5 seconds hold - Seated Hip External Rotation AROM  - 1 x daily - 7 x weekly - 2 sets - 10 reps - 5 seconds hold  ASSESSMENT:  CLINICAL  IMPRESSION: Pt demonstrates increased strength with ability to step up on 4 inch step leading with the L and no hand use on the //bars. OBJECTIVE IMPAIRMENTS: decreased endurance, decreased ROM, decreased strength, impaired flexibility, and pain.   ACTIVITY LIMITATIONS: squatting, stairs, and transfers  PARTICIPATION LIMITATIONS: community activity, church, and farming  PERSONAL FACTORS: 1-2 comorbidities: previous fractures, LE neuropathy are also affecting patient's functional outcome.   REHAB POTENTIAL: Good  CLINICAL DECISION MAKING: Stable/uncomplicated  EVALUATION COMPLEXITY: Moderate   GOALS: Goals reviewed with patient? Yes  SHORT TERM GOALS: Target date: 01/10/2024  MET   Pt to be independent with HEP. Baseline: Goal status: MET 01/08/24  2.  Decrease pain by 1-2 levels. Baseline: 7/10 Goal status: MET  01/10/24  LONG TERM GOALS: Target date: 02/21/2024  10weeks      Pt to be independent with self progressive HEP at time of discharge. Baseline:  Goal status: Ongoing    12/26/2023  2.  Pt able to ambulate community distances without AD and no pain. Baseline: SPC Goal status: Ongoing    12/26/2023  3.  Increase L LE strength by 1/2 grade to 1 full grade. Baseline: 4- Goal status: Ongoing    12/26/2023  4.  Increase knee AROM to 0>105 active. Baseline: 5>90 Goal status: Ongoing    12/26/2023  5.  Decrease max pain to 2/10 with all activities. Baseline: 7/10 Goal status: Ongoing    12/26/2023  6.  Increase PSFS score  by at least 2-3 points to demonstrate measurable difference. Baseline: 5.66 Goal status: Ongoing    12/26/2023   PLAN:  PT FREQUENCY: 2x/week  PT DURATION: 10  PLANNED INTERVENTIONS: 02835- PT Re-evaluation, 97110-Therapeutic exercises, 97530- Therapeutic activity, 97112- Neuromuscular re-education, 97535- Self Care, 02859- Manual therapy, [phone]- Gait training, [phone]- Aquatic Therapy, Patient/Family education, Balance training, Stair  training, Joint mobilization, Scar mobilization, and Moist heat  PLAN FOR NEXT SESSION:   Continue with exercises, mobility and balance exercises.   Burnard CHRISTELLA Meth, PT, 01/16/2024, 10:51 AM

## 2024-01-16 ENCOUNTER — Ambulatory Visit

## 2024-01-16 DIAGNOSIS — R600 Localized edema: Secondary | ICD-10-CM

## 2024-01-16 DIAGNOSIS — M6281 Muscle weakness (generalized): Secondary | ICD-10-CM

## 2024-01-16 DIAGNOSIS — M25662 Stiffness of left knee, not elsewhere classified: Secondary | ICD-10-CM | POA: Diagnosis not present

## 2024-01-16 DIAGNOSIS — M25562 Pain in left knee: Secondary | ICD-10-CM | POA: Diagnosis not present

## 2024-01-16 DIAGNOSIS — M79662 Pain in left lower leg: Secondary | ICD-10-CM

## 2024-01-16 DIAGNOSIS — R2689 Other abnormalities of gait and mobility: Secondary | ICD-10-CM

## 2024-01-16 DIAGNOSIS — G8929 Other chronic pain: Secondary | ICD-10-CM

## 2024-01-16 DIAGNOSIS — R2681 Unsteadiness on feet: Secondary | ICD-10-CM

## 2024-01-17 ENCOUNTER — Encounter

## 2024-01-18 NOTE — Therapy (Signed)
 OUTPATIENT PHYSICAL THERAPY LOWER EXTREMITY TREATMENT   Patient Name: Jeffrey [name] MRN: [id number] DOB:[birth year], 68 y.o., male Today's Date: 01/19/2024  END OF SESSION:  PT End of Session - 01/19/24 1156     Visit Number 7    Number of Visits 21    Date for Recertification  02/21/24    PT Start Time 0930    PT Stop Time 1008    PT Time Calculation (min) 38 min    Activity Tolerance Patient tolerated treatment well    Behavior During Therapy WFL for tasks assessed/performed                Past Medical History:  Diagnosis Date   Aneurysm of infrarenal abdominal aorta    CAD (coronary artery disease)    4 stents   Carotid artery disease    Left ICA   Diabetes mellitus without complication (HCC)    Dysrhythmia    A. Fib   Hyperlipidemia    Intracerebral hemorrhage (HCC) 1960   Peripheral neuropathy    Post-traumatic osteoarthritis of left knee    Past Surgical History:  Procedure Laterality Date   APPENDECTOMY  1960   I & D EXTREMITY Right 06/19/2020   Procedure: IRRIGATION AND DEBRIDEMENT LONG FINGER;  Surgeon: Murrell Drivers, MD;  Location: [facility] OR;  Service: Orthopedics;  Laterality: Right;   LEFT HEART CATH  06/20/2018   ORIF FEMUR FRACTURE  1988   ORIF FEMUR FRACTURE Left 10/03/2023   Procedure: OPEN REDUCTION INTERNAL FIXATION (ORIF) DISTAL FEMUR FRACTURE;  Surgeon: Celena Sharper, MD;  Location: [facility] OR;  Service: Orthopedics;  Laterality: Left;   TOTAL KNEE ARTHROPLASTY Left 05/12/2023   Procedure: LEFT TOTAL KNEE ARTHROPLASTY;  Surgeon: Jerri Kay CHRISTELLA, MD;  Location: [facility] OR;  Service: Orthopedics;  Laterality: Left;   VARICOSE VEIN SURGERY Right    right leg   Patient Active Problem List   Diagnosis Date Noted   Periprosthetic fracture around internal prosthetic left knee joint 10/02/2023   Supracondylar fracture of left femur (HCC) 10/02/2023   Closed left femoral fracture (HCC) 10/02/2023   Hypokalemia 10/02/2023   Status post total left knee  replacement 05/12/2023   Post-traumatic osteoarthritis of left knee 01/31/2023   Atrial fibrillation (HCC) 11/26/2021   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   Occlusion and stenosis of carotid artery without mention of cerebral infarction 11/19/2013    [facility]  REFERRING PROVIDER: Celena Sharper, MD , Ronal Grave PA  REFERRING DIAG: femur 15 visits  Closed fracture of left femur, unspecified fracture morphology, unspecified portion of femur, initial encounter (HCC)  - Primary S72.92XA    THERAPY DIAG:  Chronic pain of left knee  Muscle weakness (generalized)  Stiffness of left knee, not elsewhere classified  Localized edema  Unsteadiness on feet  Other abnormalities of gait and mobility  Pain in left lower leg  Rationale for Evaluation and Treatment: Rehabilitation  ONSET DATE: 10/03/23  SUBJECTIVE:   SUBJECTIVE STATEMENT: Pt reports seeing trauma surgeon who states he is doing well , but ordered a Cat scan because he saw a space in the bone from 1 viewthat may not be healing.  PERTINENT HISTORY: L TKA 05/12/23,  L femur fx after fall 09/30/23 ORIF 10/03/23, this is 3rd L femur fx, CAD, DM, peripheral neuropathy  PAIN:  Are you having pain?   Yes: NPRS scale: sitting 0/10, sit to stand 2/10 Pain location: left thigh distal - lateral  Pain description: dull, sharp Aggravating factors: walking, standing  Relieving factors: rest, pain meds at night  PRECAUTIONS: Other: + L TKA  RED FLAGS: 3rd L femur fracture   WEIGHT BEARING RESTRICTIONS: Yes WBAT  FALLS:  Has patient fallen in last 6 months? Yes. Number of falls 1  LIVING ENVIRONMENT: Lives with: lives with their spouse Lives in: House/apartment Stairs: Yes: External: 4 steps; can reach both Has following equipment at home: Single point cane  OCCUPATION: retired   PLOF: Independent  PATIENT GOALS: decrease pain  NEXT MD VISIT: unknown  OBJECTIVE:  Note: Objective measures were  completed at Evaluation unless otherwise noted.  DIAGNOSTIC FINDINGS: 11/08/23 X-rays demonstrate evidence of consolidation at the fracture site.  No  hardware complication.   PATIENT SURVEYS:  PSFS: THE PATIENT SPECIFIC FUNCTIONAL SCALE  Place score of 0-10 (0 = unable to perform activity and 10 = able to perform activity at the same level as before injury or problem)  Activity Date: 12/13/23    walking 7    2.transfers 8    3.stairs without handrails 2    4.      Total Score 5.6      Total Score = Sum of activity scores/number of activities  Minimally Detectable Change: 3 points (for single activity); 2 points (for average score)  [facility] (nd). The Patient Specific Functional Scale . Retrieved from [email or web address]   COGNITION: Overall cognitive status: Within functional limits for tasks assessed     SENSATION: + hx neuropathy feet  EDEMA: ---   MUSCLE LENGTH: Hamstrings: Right mod restriction  deg; Left Right mod restriction    POSTURE: flexed trunk   PALPATION: 1+ tenderness at ant knee and at lateral incision  LOWER EXTREMITY ROM: hip PROM  [facility]  A/P ROM Left eval  Hip flexion 100  Hip extension   Hip abduction 40  Hip adduction   Hip internal rotation   Hip external rotation   Knee flexion 90/100  Knee extension +5  Ankle dorsiflexion   Ankle plantarflexion   Ankle inversion   Ankle eversion    (Blank rows = not tested)  LOWER EXTREMITY MMT:  MMT Left eval  Hip flexion 4-  Hip extension 4-  Hip abduction 4-  Hip adduction   Hip internal rotation   Hip external rotation   Knee flexion 4  Knee extension 4-  Ankle dorsiflexion   Ankle plantarflexion   Ankle inversion   Ankle eversion    (Blank rows = not tested)  LOWER EXTREMITY SPECIAL TESTS: none   FUNCTIONAL TESTS:  5 times sit to stand: 15 sec with UE use and inc sway/neuropathy  GAIT: Distance walked: short  community Assistive device utilized: Single point cane Level of assistance: Modified independence Comments: ---                                                                                                                                TODAY'S TREATMENT:   L LE  01/19/24 Therapeutic Exercise Nustep level 5  for 8 min.  Hamstring stretch LLE long sit with strap DF 30 sec hold 3 reps Lateral hip stretch - SLR with strap & adduction 30 sec hold 3 reps L LE LAQ 4# 3x10 SAQ 4# 3x10 Side lying clamshells 3x10 Therapeutic Activities  Seated marching 2x10 with 4# on ankle Seated hip adduction ball squeeze 10x10 sec Step 8 taps forward and lateral 10x each no UE use Step onto 4 step 2x10 1 set with R hand only, 1 set with no hand use     01/16/24 Therapeutic Exercise Nustep level 5  for 9 min.  Hamstring stretch LLE long sit with strap DF 30 sec hold 3 reps Lateral hip stretch - SLR with strap & adduction 30 sec hold 3 reps LLE Seated hip internal rotation & external rotation 10 reps 2 sets each.  L LE LAQ 4# 3x10 SAQ 4# 3x10 Side lying clamshells 3x10 Therapeutic Activities  Seated marching 2x10 Seated hip adduction ball squeeze 10x10 sec Step 8 taps forward and lateral 12x each Step onto 4 step 2x10 1 set with R hand only, 1 set with no hand use     01/10/24 Therapeutic Exercise Recumbent bike seat 9 level 1 for 9 min.  Hamstring stretch LLE long sit with strap DF 30 sec hold 3 reps Lateral hip stretch - SLR with strap & adduction 30 sec hold 3 reps LLE Seated hip internal rotation & external rotation 10 reps 2 sets each.  L LE LAQ 3# 3x10 SAQ 4# 3x10 Side lying clamshells 3x10 Therapeutic Activities  Seated marching 2x10 Seated hip adduction ball squeeze 10x10 sec Step 8 taps forward and lateral 12x each Step onto 6 step 2x10 with use of bilateral hand rails   01/08/24 Therapeutic Exercise Recumbent bike seat 9 level 1 for 9 min.  Hamstring stretch LLE  long sit with strap DF 30 sec hold 3 reps Lateral hip stretch - SLR with strap & adduction 30 sec hold 3 reps LLE Seated hip internal rotation & external rotation 10 reps 2 sets each.   Therapeutic Activities  Seated marching 2x10 Seated hip adduction ball squeeze 10x10 sec Manual  Percussive device to L gluteal band TFL     12/26/2023 Therapeutic Exercise recumbent bike seat 9 level 1 for 8 min.  Hamstring stretch LLE long sit with strap DF 30 sec hold 3 reps Lateral hip stretch - SLR with strap & adduction 30 sec hold 3 reps LLE Seated hip internal rotation & external rotation 10 reps 2 sets each.   Therapeutic Activities  PT demo & verbal on technique for sit to/from stand.  Pt performed 10 reps with PT cues and intermittent tactile cues for stabilization.  PT recommended performing with table in front of him 10 reps after each meal focusing on technique.  Pt verbalized understanding.    Self-Care: PT demo & verbal cues on ice massage.   Pt verbalized & return demo understanding.      TREATMENT DATE: L Femur 12/21/23 There ex for ROM/strengthening: Rec bike lev 1  8 min LAQ 3# 3x10 SAQ 3x10 SLR #1.5 3x10 SR 3x10  There Act for transfers, stairs: Squats 2x10 Bridges 3x10 Seated heel raises 2x10  12/13/23 Initial evaluation completed followed by instruction and trial set of HEP.   PATIENT EDUCATION:  Education details: HEP Person educated: Patient Education method: Explanation, Demonstration, and Actor cues Education comprehension: verbalized understanding, returned demonstration, and verbal cues required  HOME EXERCISE  PROGRAM: Access Code: [email or web address] URL: [email or web address] Date: 12/26/2023 Prepared by: Grayce Spatz  Exercises - Long Sitting Quad Set with Towel Roll Under Heel  - 2 x daily - 7 x weekly - 2 sets - 10 reps - 2 hold - Active Straight Leg Raise with Quad Set  - 2 x daily - 7 x weekly - 2 sets - 10 reps - Supine Heel Slides   - 2 x daily - 7 x weekly - 2 sets - 10 reps - 2 hold - Supine Bridge  - 2 x daily - 7 x weekly - 2 sets - 10 reps - 2 hold - Mini Squat with Counter Support  - 2 x daily - 7 x weekly - 2 sets - 10 reps - Seated Table Hamstring Stretch  - 1 x daily - 7 x weekly - 1 sets - 3 reps - 30 seconds hold - Supine ITB Stretch with Strap  - 1 x daily - 7 x weekly - 1 sets - 3 reps - 30 seconds hold - Seated Hip Internal Rotation AROM  - 1 x daily - 7 x weekly - 2 sets - 10 reps - 5 seconds hold - Seated Hip External Rotation AROM  - 1 x daily - 7 x weekly - 2 sets - 10 reps - 5 seconds hold  ASSESSMENT:  CLINICAL IMPRESSION: Pt with no pain increase while exercising.  Some stiffness present with transfers like on/off Nustep. OBJECTIVE IMPAIRMENTS: decreased endurance, decreased ROM, decreased strength, impaired flexibility, and pain.   ACTIVITY LIMITATIONS: squatting, stairs, and transfers  PARTICIPATION LIMITATIONS: community activity, church, and farming  PERSONAL FACTORS: 1-2 comorbidities: previous fractures, LE neuropathy are also affecting patient's functional outcome.   REHAB POTENTIAL: Good  CLINICAL DECISION MAKING: Stable/uncomplicated  EVALUATION COMPLEXITY: Moderate   GOALS: Goals reviewed with patient? Yes  SHORT TERM GOALS: Target date: 01/10/2024  MET   Pt to be independent with HEP. Baseline: Goal status: MET 01/08/24  2.  Decrease pain by 1-2 levels. Baseline: 7/10 Goal status: MET  01/10/24  LONG TERM GOALS: Target date: 02/21/2024  10weeks      Pt to be independent with self progressive HEP at time of discharge. Baseline:  Goal status: Ongoing    12/26/2023  2.  Pt able to ambulate community distances without AD and no pain. Baseline: SPC Goal status: Ongoing    12/26/2023  3.  Increase L LE strength by 1/2 grade to 1 full grade. Baseline: 4- Goal status: Ongoing    12/26/2023  4.  Increase knee AROM to 0>105 active. Baseline: 5>90 Goal status: Ongoing     12/26/2023  5.  Decrease max pain to 2/10 with all activities. Baseline: 7/10 Goal status: Ongoing    12/26/2023  6.  Increase PSFS score  by at least 2-3 points to demonstrate measurable difference. Baseline: 5.66 Goal status: Ongoing    12/26/2023   PLAN:  PT FREQUENCY: 2x/week  PT DURATION: 10  PLANNED INTERVENTIONS: 02835- PT Re-evaluation, 97110-Therapeutic exercises, 97530- Therapeutic activity, 97112- Neuromuscular re-education, 97535- Self Care, 02859- Manual therapy, [phone]- Gait training, [phone]- Aquatic Therapy, Patient/Family education, Balance training, Stair training, Joint mobilization, Scar mobilization, and Moist heat  PLAN FOR NEXT SESSION:   Continue with exercises, mobility and balance exercises.   Burnard CHRISTELLA Meth, PT, 01/19/2024, 11:57 AM

## 2024-01-19 ENCOUNTER — Ambulatory Visit (INDEPENDENT_AMBULATORY_CARE_PROVIDER_SITE_OTHER)

## 2024-01-19 DIAGNOSIS — M79662 Pain in left lower leg: Secondary | ICD-10-CM

## 2024-01-19 DIAGNOSIS — G8929 Other chronic pain: Secondary | ICD-10-CM

## 2024-01-19 DIAGNOSIS — M25562 Pain in left knee: Secondary | ICD-10-CM | POA: Diagnosis not present

## 2024-01-19 DIAGNOSIS — R600 Localized edema: Secondary | ICD-10-CM | POA: Diagnosis not present

## 2024-01-19 DIAGNOSIS — M6281 Muscle weakness (generalized): Secondary | ICD-10-CM | POA: Diagnosis not present

## 2024-01-19 DIAGNOSIS — M25662 Stiffness of left knee, not elsewhere classified: Secondary | ICD-10-CM | POA: Diagnosis not present

## 2024-01-19 DIAGNOSIS — R2689 Other abnormalities of gait and mobility: Secondary | ICD-10-CM

## 2024-01-19 DIAGNOSIS — R2681 Unsteadiness on feet: Secondary | ICD-10-CM

## 2024-01-22 ENCOUNTER — Encounter: Payer: Self-pay | Admitting: Physical Therapy

## 2024-01-22 ENCOUNTER — Ambulatory Visit: Admitting: Physical Therapy

## 2024-01-22 DIAGNOSIS — M6281 Muscle weakness (generalized): Secondary | ICD-10-CM

## 2024-01-22 DIAGNOSIS — R600 Localized edema: Secondary | ICD-10-CM | POA: Diagnosis not present

## 2024-01-22 DIAGNOSIS — G8929 Other chronic pain: Secondary | ICD-10-CM

## 2024-01-22 DIAGNOSIS — M25562 Pain in left knee: Secondary | ICD-10-CM | POA: Diagnosis not present

## 2024-01-22 DIAGNOSIS — R2689 Other abnormalities of gait and mobility: Secondary | ICD-10-CM

## 2024-01-22 DIAGNOSIS — R2681 Unsteadiness on feet: Secondary | ICD-10-CM

## 2024-01-22 DIAGNOSIS — M25662 Stiffness of left knee, not elsewhere classified: Secondary | ICD-10-CM | POA: Diagnosis not present

## 2024-01-22 NOTE — Therapy (Signed)
 OUTPATIENT PHYSICAL THERAPY LOWER EXTREMITY TREATMENT   Patient Name: Jeffrey [name] MRN: [id number] DOB:1955-10-28, 68 y.o., male Today's Date: 01/22/2024  END OF SESSION:  PT End of Session - 01/22/24 1015     Visit Number 8    Number of Visits 21    Date for Recertification  02/21/24    [facility]ization Type [facility]    Authorization Time Period [place] [id number] 9/17-2/5  15 VISITS    Authorization - Visit Number 8    Authorization - Number of Visits 15    PT Start Time 0929    PT Stop Time 1013    PT Time Calculation (min) 44 min    Activity Tolerance Patient tolerated treatment well    Behavior During Therapy WFL for tasks assessed/performed                 Past Medical History:  Diagnosis Date   Aneurysm of infrarenal abdominal aorta    CAD (coronary artery disease)    4 stents   Carotid artery disease    Left ICA   Diabetes mellitus without complication (HCC)    Dysrhythmia    A. Fib   Hyperlipidemia    Intracerebral hemorrhage (HCC) 1960   Peripheral neuropathy    Post-traumatic osteoarthritis of left knee    Past Surgical History:  Procedure Laterality Date   APPENDECTOMY  1960   I & D EXTREMITY Right 06/19/2020   Procedure: IRRIGATION AND DEBRIDEMENT LONG FINGER;  Surgeon: Murrell Drivers, MD;  Location: [facility] OR;  Service: Orthopedics;  Laterality: Right;   LEFT HEART CATH  06/20/2018   ORIF FEMUR FRACTURE  1988   ORIF FEMUR FRACTURE Left 10/03/2023   Procedure: OPEN REDUCTION INTERNAL FIXATION (ORIF) DISTAL FEMUR FRACTURE;  Surgeon: Celena Sharper, MD;  Location: [facility];  Service: Orthopedics;  Laterality: Left;   TOTAL KNEE ARTHROPLASTY Left 05/12/2023   Procedure: LEFT TOTAL KNEE ARTHROPLASTY;  Surgeon: Jerri Kay CHRISTELLA, MD;  Location: [facility];  Service: Orthopedics;  Laterality: Left;   VARICOSE VEIN SURGERY Right    right leg   Patient Active Problem List   Diagnosis Date Noted   Periprosthetic fracture around internal prosthetic left knee joint 10/02/2023    Supracondylar fracture of left femur (HCC) 10/02/2023   Closed left femoral fracture (HCC) 10/02/2023   Hypokalemia 10/02/2023   Status post total left knee replacement 05/12/2023   Post-traumatic osteoarthritis of left knee 01/31/2023   Atrial fibrillation (HCC) 11/26/2021   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   Occlusion and stenosis of carotid artery without mention of cerebral infarction 11/19/2013    [facility]  REFERRING PROVIDER: Celena Sharper, MD , Ronal Grave PA  REFERRING DIAG: femur 15 visits  Closed fracture of left femur, unspecified fracture morphology, unspecified portion of femur, initial encounter (HCC)  - Primary S72.92XA   THERAPY DIAG:  Chronic pain of left knee  Muscle weakness (generalized)  Stiffness of left knee, not elsewhere classified  Localized edema  Unsteadiness on feet  Other abnormalities of gait and mobility  Rationale for Evaluation and Treatment: Rehabilitation  ONSET DATE: 10/03/23  SUBJECTIVE:   SUBJECTIVE STATEMENT:  He has not heard about CT scan yet. No changes since last PT.  He has been doing his exercises.  PERTINENT HISTORY: L TKA 05/12/23,  L femur fx after fall 09/30/23 ORIF 10/03/23, this is 3rd L femur fx, CAD, DM, peripheral neuropathy  PAIN:  Are you having pain?   Yes: NPRS scale: sitting 0/10, sit to  stand 7-8/10 then settles 4-5/10 when walking.  Once he sits down pain subsides.   Pain location: left thigh distal - lateral  Pain description: dull, sharp Aggravating factors: walking, standing Relieving factors: rest, pain meds at night  PRECAUTIONS: Other: + L TKA  RED FLAGS: 3rd L femur fracture   WEIGHT BEARING RESTRICTIONS: Yes WBAT  FALLS:  Has patient fallen in last 6 months? Yes. Number of falls 1  LIVING ENVIRONMENT: Lives with: lives with their spouse Lives in: House/apartment Stairs: Yes: External: 4 steps; can reach both Has following equipment at home: Single point  cane  OCCUPATION: retired   PLOF: Independent  PATIENT GOALS: decrease pain  NEXT MD VISIT: unknown  OBJECTIVE:  Note: Objective measures were completed at Evaluation unless otherwise noted.  DIAGNOSTIC FINDINGS: 11/08/23 X-rays demonstrate evidence of consolidation at the fracture site.  No  hardware complication.   PATIENT SURVEYS:  PSFS: THE PATIENT SPECIFIC FUNCTIONAL SCALE  Place score of 0-10 (0 = unable to perform activity and 10 = able to perform activity at the same level as before injury or problem)  Activity Date: 12/13/23    walking 7    2.transfers 8    3.stairs without handrails 2    4.      Total Score 5.6      Total Score = Sum of activity scores/number of activities  Minimally Detectable Change: 3 points (for single activity); 2 points (for average score)  [employer] [facility] (nd). The Patient Specific Functional Scale . Retrieved from [email or web address]   COGNITION: Overall cognitive status: Within functional limits for tasks assessed     SENSATION: + hx neuropathy feet  EDEMA: ---   MUSCLE LENGTH: Hamstrings: Right mod restriction  deg; Left Right mod restriction    POSTURE: flexed trunk   PALPATION: 1+ tenderness at ant knee and at lateral incision  LOWER EXTREMITY ROM: hip PROM [facility]  A/P ROM Left eval  Hip flexion 100  Hip extension   Hip abduction 40  Hip adduction   Hip internal rotation   Hip external rotation   Knee flexion 90/100  Knee extension +5  Ankle dorsiflexion   Ankle plantarflexion   Ankle inversion   Ankle eversion    (Blank rows = not tested)  LOWER EXTREMITY MMT:  MMT Left eval  Hip flexion 4-  Hip extension 4-  Hip abduction 4-  Hip adduction   Hip internal rotation   Hip external rotation   Knee flexion 4  Knee extension 4-  Ankle dorsiflexion   Ankle plantarflexion   Ankle inversion   Ankle eversion    (Blank rows = not tested)  LOWER  EXTREMITY SPECIAL TESTS: none   FUNCTIONAL TESTS:  5 times sit to stand: 15 sec with UE use and inc sway/neuropathy  GAIT: Distance walked: short community Assistive device utilized: Single point cane Level of assistance: Modified independence Comments: ---  TODAY'S TREATMENT:   L LE 01/22/2024 Therapeutic Exercise [employer] recumbent bike seat 13 level 1 with BLEs & BUEs  for 4 min then BLEs only 4 min.  Gastroc stretch on step heel depression 30 sec and attempting BLE heel raises but strength unable to lift heels even with BUE support //bars Hamstring stretch standing on step with foot DF 30 sec hold 3 reps  Therapeutic Activities  PT demo & verbal cues on sit to stand & stand to sit technique.  Pt performed 10 reps from 18 chair with armrests and 6 reps from 18 chair without armrests using BUEs on seat.  Pt able to improve balance with proper technique.   PT demo & verbal cues on technique for step up & step down using LLE.  Step onto 4 step 10 reps 1st set BUE //bars, then 2nd set with R hand only Rocker board (square with 2 pivot points) ant/ mid/ post with ankle & pelvic weight shift with BUE // bars light support & PT tactile cues 1 min 2 reps.  Mirror to facilitate upright posture.     TREATMENT:   L LE 01/19/24 Therapeutic Exercise Nustep level 5  for 8 min.  Hamstring stretch LLE long sit with strap DF 30 sec hold 3 reps Lateral hip stretch - SLR with strap & adduction 30 sec hold 3 reps L LE LAQ 4# 3x10 SAQ 4# 3x10 Side lying clamshells 3x10 Therapeutic Activities  Seated marching 2x10 with 4# on ankle Seated hip adduction ball squeeze 10x10 sec Step 8 taps forward and lateral 10x each no UE use Step onto 4 step 2x10 1 set with R hand only, 1 set with no hand use     01/16/24 Therapeutic Exercise Nustep level 5  for 9 min.   Hamstring stretch LLE long sit with strap DF 30 sec hold 3 reps Lateral hip stretch - SLR with strap & adduction 30 sec hold 3 reps LLE Seated hip internal rotation & external rotation 10 reps 2 sets each.  L LE LAQ 4# 3x10 SAQ 4# 3x10 Side lying clamshells 3x10 Therapeutic Activities  Seated marching 2x10 Seated hip adduction ball squeeze 10x10 sec Step 8 taps forward and lateral 12x each Step onto 4 step 2x10 1 set with R hand only, 1 set with no hand use     PATIENT EDUCATION:  Education details: HEP Person educated: Patient Education method: Programmer, Multimedia, Demonstration, and Actor cues Education comprehension: verbalized understanding, returned demonstration, and verbal cues required  HOME EXERCISE PROGRAM: Access Code: [email or web address] URL: [email or web address] Date: 12/26/2023 Prepared by: Grayce Spatz  Exercises - Long Sitting Quad Set with Towel Roll Under Heel  - 2 x daily - 7 x weekly - 2 sets - 10 reps - 2 hold - Active Straight Leg Raise with Quad Set  - 2 x daily - 7 x weekly - 2 sets - 10 reps - Supine Heel Slides  - 2 x daily - 7 x weekly - 2 sets - 10 reps - 2 hold - Supine Bridge  - 2 x daily - 7 x weekly - 2 sets - 10 reps - 2 hold - Mini Squat with Counter Support  - 2 x daily - 7 x weekly - 2 sets - 10 reps - Seated Table Hamstring Stretch  - 1 x daily - 7 x weekly - 1 sets - 3 reps - 30 seconds hold - Supine ITB Stretch with Strap  - 1 x daily - 7 x weekly -  1 sets - 3 reps - 30 seconds hold - Seated Hip Internal Rotation AROM  - 1 x daily - 7 x weekly - 2 sets - 10 reps - 5 seconds hold - Seated Hip External Rotation AROM  - 1 x daily - 7 x weekly - 2 sets - 10 reps - 5 seconds hold  ASSESSMENT:  CLINICAL IMPRESSION: Patient improved sit to/from stand with PT instruction in technique & repetition.  PT also worked on functional activities to facilitate knee control & motion.  Patient continues to benefit from skilled PT.   OBJECTIVE  IMPAIRMENTS: decreased endurance, decreased ROM, decreased strength, impaired flexibility, and pain.   ACTIVITY LIMITATIONS: squatting, stairs, and transfers  PARTICIPATION LIMITATIONS: community activity, church, and farming  PERSONAL FACTORS: 1-2 comorbidities: previous fractures, LE neuropathy are also affecting patient's functional outcome.   REHAB POTENTIAL: Good  CLINICAL DECISION MAKING: Stable/uncomplicated  EVALUATION COMPLEXITY: Moderate   GOALS: Goals reviewed with patient? Yes  SHORT TERM GOALS: Target date: 01/10/2024  MET   Pt to be independent with HEP. Baseline: Goal status: MET 01/08/24  2.  Decrease pain by 1-2 levels. Baseline: 7/10 Goal status: MET  01/10/24  LONG TERM GOALS: Target date: 02/21/2024  10weeks      Pt to be independent with self progressive HEP at time of discharge. Baseline:  Goal status: Ongoing    01/22/2024  2.  Pt able to ambulate community distances without AD and no pain. Baseline: SPC Goal status: Ongoing     01/22/2024  3.  Increase L LE strength by 1/2 grade to 1 full grade. Baseline: 4- Goal status: Ongoing     01/22/2024  4.  Increase knee AROM to 0>105 active. Baseline: 5>90 Goal status: Ongoing     01/22/2024  5.  Decrease max pain to 2/10 with all activities. Baseline: 7/10 Goal status: Ongoing     01/22/2024  6.  Increase PSFS score  by at least 2-3 points to demonstrate measurable difference. Baseline: 5.66 Goal status: Ongoing    01/22/2024   PLAN:  PT FREQUENCY: 2x/week  PT DURATION: 10  PLANNED INTERVENTIONS: 02835- PT Re-evaluation, 97110-Therapeutic exercises, 97530- Therapeutic activity, 97112- Neuromuscular re-education, 97535- Self Care, 02859- Manual therapy, [phone]- Gait training, [phone]- Aquatic Therapy, Patient/Family education, Balance training, Stair training, Joint mobilization, Scar mobilization, and Moist heat  PLAN FOR NEXT SESSION:     Continue with porgressive exercises, mobility and  balance exercises.   Grayce Spatz, PT, DPT 01/22/2024, 1:56 PM

## 2024-01-24 ENCOUNTER — Encounter: Payer: Self-pay | Admitting: Physical Therapy

## 2024-01-24 ENCOUNTER — Ambulatory Visit: Admitting: Physical Therapy

## 2024-01-24 DIAGNOSIS — M6281 Muscle weakness (generalized): Secondary | ICD-10-CM | POA: Diagnosis not present

## 2024-01-24 DIAGNOSIS — R2681 Unsteadiness on feet: Secondary | ICD-10-CM

## 2024-01-24 DIAGNOSIS — M25562 Pain in left knee: Secondary | ICD-10-CM | POA: Diagnosis not present

## 2024-01-24 DIAGNOSIS — R600 Localized edema: Secondary | ICD-10-CM

## 2024-01-24 DIAGNOSIS — M25662 Stiffness of left knee, not elsewhere classified: Secondary | ICD-10-CM | POA: Diagnosis not present

## 2024-01-24 DIAGNOSIS — G8929 Other chronic pain: Secondary | ICD-10-CM

## 2024-01-24 DIAGNOSIS — R2689 Other abnormalities of gait and mobility: Secondary | ICD-10-CM

## 2024-01-24 NOTE — Therapy (Signed)
 OUTPATIENT PHYSICAL THERAPY LOWER EXTREMITY TREATMENT   Patient Name: Jeffrey [name] MRN: [id number] DOB:[birth year], 68 y.o., male Today's Date: 01/24/2024  END OF SESSION:  PT End of Session - 01/24/24 0928     Visit Number 9    Number of Visits 21    Date for Recertification  02/21/24    Authorization Type [facility]    Authorization Time Period [place] [id number] 9/17-2/5  15 VISITS    Authorization - Visit Number 9    Authorization - Number of Visits 15    PT Start Time 0928    PT Stop Time 1015    PT Time Calculation (min) 47 min    Activity Tolerance Patient tolerated treatment well    Behavior During Therapy WFL for tasks assessed/performed          Past Medical History:  Diagnosis Date   Aneurysm of infrarenal abdominal aorta    CAD (coronary artery disease)    4 stents   Carotid artery disease    Left ICA   Diabetes mellitus without complication (HCC)    Dysrhythmia    A. Fib   Hyperlipidemia    Intracerebral hemorrhage (HCC) 1960   Peripheral neuropathy    Post-traumatic osteoarthritis of left knee    Past Surgical History:  Procedure Laterality Date   APPENDECTOMY  1960   I & D EXTREMITY Right 06/19/2020   Procedure: IRRIGATION AND DEBRIDEMENT LONG FINGER;  Surgeon: Murrell Drivers, MD;  Location: [facility] OR;  Service: Orthopedics;  Laterality: Right;   LEFT HEART CATH  06/20/2018   ORIF FEMUR FRACTURE  1988   ORIF FEMUR FRACTURE Left 10/03/2023   Procedure: OPEN REDUCTION INTERNAL FIXATION (ORIF) DISTAL FEMUR FRACTURE;  Surgeon: Celena Sharper, MD;  Location: [facility] OR;  Service: Orthopedics;  Laterality: Left;   TOTAL KNEE ARTHROPLASTY Left 05/12/2023   Procedure: LEFT TOTAL KNEE ARTHROPLASTY;  Surgeon: Jerri Kay CHRISTELLA, MD;  Location: [facility] OR;  Service: Orthopedics;  Laterality: Left;   VARICOSE VEIN SURGERY Right    right leg   Patient Active Problem List   Diagnosis Date Noted   Periprosthetic fracture around internal prosthetic left knee joint 10/02/2023   Supracondylar  fracture of left femur (HCC) 10/02/2023   Closed left femoral fracture (HCC) 10/02/2023   Hypokalemia 10/02/2023   Status post total left knee replacement 05/12/2023   Post-traumatic osteoarthritis of left knee 01/31/2023   Atrial fibrillation (HCC) 11/26/2021   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   Occlusion and stenosis of carotid artery without mention of cerebral infarction 11/19/2013    [facility]  REFERRING PROVIDER: Celena Sharper, MD , Ronal Grave PA  REFERRING DIAG: femur 15 visits  Closed fracture of left femur, unspecified fracture morphology, unspecified portion of femur, initial encounter (HCC)  - Primary S72.92XA   THERAPY DIAG:  Chronic pain of left knee  Muscle weakness (generalized)  Stiffness of left knee, not elsewhere classified  Localized edema  Unsteadiness on feet  Other abnormalities of gait and mobility  Rationale for Evaluation and Treatment: Rehabilitation  ONSET DATE: 10/03/23  SUBJECTIVE:   SUBJECTIVE STATEMENT:  yesterday went to [place] got flu shot and cardiologist who says okay.  He is still working on getting CT scan.    PERTINENT HISTORY: L TKA 05/12/23,  L femur fx after fall 09/30/23 ORIF 10/03/23, this is 3rd L femur fx, CAD, DM, peripheral neuropathy  PAIN:  Are you having pain?   Yes: NPRS scale: sitting 0/10, sit to stand 7-8/10 then settles  4-5/10 when walking.  Once he sits down pain subsides.   Pain location: left thigh distal - lateral  Pain description: dull, sharp Aggravating factors: walking, standing Relieving factors: rest, pain meds at night  PRECAUTIONS: Other: + L TKA  RED FLAGS: 3rd L femur fracture   WEIGHT BEARING RESTRICTIONS: Yes WBAT  FALLS:  Has patient fallen in last 6 months? Yes. Number of falls 1  LIVING ENVIRONMENT: Lives with: lives with their spouse Lives in: House/apartment Stairs: Yes: External: 4 steps; can reach both Has following equipment at home: Single point  cane  OCCUPATION: retired   PLOF: Independent  PATIENT GOALS: decrease pain  NEXT MD VISIT: unknown  OBJECTIVE:  Note: Objective measures were completed at Evaluation unless otherwise noted.  DIAGNOSTIC FINDINGS: 11/08/23 X-rays demonstrate evidence of consolidation at the fracture site.  No  hardware complication.   PATIENT SURVEYS:  PSFS: THE PATIENT SPECIFIC FUNCTIONAL SCALE  Place score of 0-10 (0 = unable to perform activity and 10 = able to perform activity at the same level as before injury or problem)  Activity Date: 12/13/23    walking 7    2.transfers 8    3.stairs without handrails 2    4.      Total Score 5.6      Total Score = Sum of activity scores/number of activities  Minimally Detectable Change: 3 points (for single activity); 2 points (for average score)  [facility] (nd). The Patient Specific Functional Scale . Retrieved from [email or web address]   COGNITION: Overall cognitive status: Within functional limits for tasks assessed     SENSATION: + hx neuropathy feet  EDEMA: ---   MUSCLE LENGTH: Hamstrings: Right mod restriction  deg; Left Right mod restriction    POSTURE: flexed trunk   PALPATION: 1+ tenderness at ant knee and at lateral incision  LOWER EXTREMITY ROM: hip PROM [facility]  A/P ROM Left eval  Hip flexion 100  Hip extension   Hip abduction 40  Hip adduction   Hip internal rotation   Hip external rotation   Knee flexion 90/100  Knee extension +5  Ankle dorsiflexion   Ankle plantarflexion   Ankle inversion   Ankle eversion    (Blank rows = not tested)  LOWER EXTREMITY MMT:  MMT Left eval  Hip flexion 4-  Hip extension 4-  Hip abduction 4-  Hip adduction   Hip internal rotation   Hip external rotation   Knee flexion 4  Knee extension 4-  Ankle dorsiflexion   Ankle plantarflexion   Ankle inversion   Ankle eversion    (Blank rows = not tested)  LOWER  EXTREMITY SPECIAL TESTS: none   FUNCTIONAL TESTS:  5 times sit to stand: 15 sec with UE use and inc sway/neuropathy  GAIT: Distance walked: short community Assistive device utilized: Single point cane Level of assistance: Modified independence Comments: ---  TODAY'S TREATMENT:   L LE 01/24/2024 Therapeutic Exercise recumbent bike seat 9 level 3 for 8 min.  Gastroc stretch on step heel depression 30 sec and attempting BLE heel raises but strength unable to lift heels even with BUE support //bars Hamstring stretch standing on step with foot DF 30 sec hold 3 reps Seated BLEs planterflexion blue theraband 10 reps 2 sets and PF with inversion & eversion 10 reps 2 sets Standing BLEs alternating blue Theraband figure 8 above knees 10 reps ea abduction, extension & SLR flexion with stance LE controlled knee flexion simulating terminal stance.  PT updated HEP to include above exercises and pt verbalized understanding after performing in clinic.     TREATMENT:   L LE 01/22/2024 Therapeutic Exercise SciFit recumbent bike seat 13 level 1 with BLEs & BUEs  for 4 min then BLEs only 4 min.  Gastroc stretch on step heel depression 30 sec and attempting BLE heel raises but strength unable to lift heels even with BUE support //bars Hamstring stretch standing on step with foot DF 30 sec hold 3 reps  Therapeutic Activities  PT demo & verbal cues on sit to stand & stand to sit technique.  Pt performed 10 reps from 18 chair with armrests and 6 reps from 18 chair without armrests using BUEs on seat.  Pt able to improve balance with proper technique.   PT demo & verbal cues on technique for step up & step down using LLE.  Step onto 4 step 10 reps 1st set BUE //bars, then 2nd set with R hand only Rocker board (square with 2 pivot points) ant/ mid/ post with ankle & pelvic weight  shift with BUE // bars light support & PT tactile cues 1 min 2 reps.  Mirror to facilitate upright posture.     TREATMENT:   L LE 01/19/24 Therapeutic Exercise Nustep level 5  for 8 min.  Hamstring stretch LLE long sit with strap DF 30 sec hold 3 reps Lateral hip stretch - SLR with strap & adduction 30 sec hold 3 reps L LE LAQ 4# 3x10 SAQ 4# 3x10 Side lying clamshells 3x10 Therapeutic Activities  Seated marching 2x10 with 4# on ankle Seated hip adduction ball squeeze 10x10 sec Step 8 taps forward and lateral 10x each no UE use Step onto 4 step 2x10 1 set with R hand only, 1 set with no hand use     01/16/24 Therapeutic Exercise Nustep level 5  for 9 min.  Hamstring stretch LLE long sit with strap DF 30 sec hold 3 reps Lateral hip stretch - SLR with strap & adduction 30 sec hold 3 reps LLE Seated hip internal rotation & external rotation 10 reps 2 sets each.  L LE LAQ 4# 3x10 SAQ 4# 3x10 Side lying clamshells 3x10 Therapeutic Activities  Seated marching 2x10 Seated hip adduction ball squeeze 10x10 sec Step 8 taps forward and lateral 12x each Step onto 4 step 2x10 1 set with R hand only, 1 set with no hand use   HOME EXERCISE PROGRAM: Access Code: [email or web address] URL: [email or web address] Date: 01/24/2024 Prepared by: Grayce Spatz  Exercises - Long Sitting Quad Set with Towel Roll Under Heel  - 2 x daily - 7 x weekly - 2 sets - 10 reps - 2 hold - Active Straight Leg Raise with Quad Set  - 2 x daily - 7 x weekly - 2 sets - 10 reps - Supine Heel Slides  - 2 x daily - 7 x weekly -  2 sets - 10 reps - 2 hold - Supine Bridge  - 2 x daily - 7 x weekly - 2 sets - 10 reps - 2 hold - Mini Squat with Counter Support  - 2 x daily - 7 x weekly - 2 sets - 10 reps - Seated Table Hamstring Stretch  - 1 x daily - 7 x weekly - 1 sets - 3 reps - 30 seconds hold - Supine ITB Stretch with Strap  - 1 x daily - 7 x weekly - 1 sets - 3 reps - 30 seconds hold - Seated Hip  Internal Rotation AROM  - 1 x daily - 7 x weekly - 2 sets - 10 reps - 5 seconds hold - Seated Hip External Rotation AROM  - 1 x daily - 7 x weekly - 2 sets - 10 reps - 5 seconds hold - Gastroc Stretch on Step  - 1 x daily - 7 x weekly - 1 sets - 3 reps - 30 seconds hold - Standing Hamstring Stretch with Step  - 1 x daily - 7 x weekly - 1 sets - 3 reps - 30 seconds hold - Seated Ankle Plantar Flexion with Resistance Loop  - 1 x daily - 7 x weekly - 2 sets - 10 reps - 5 seconds hold - Long sitting ankle plantarflexion with eversion  - 1 x daily - 7 x weekly - 2 sets - 10 reps - 5 seconds hold - Long sitting ankle plantarflexion with eversion  - 1 x daily - 7 x weekly - 2 sets - 10 reps - 5 seconds hold - Standing Hip Abduction with Resistance at Thighs  - 1 x daily - 7 x weekly - 2 sets - 10 reps - 5 seconds hold - Standing Hip Extension with Resistance at Ankles and Counter Support  - 1 x daily - 7 x weekly - 2 sets - 10 reps - 5 seconds hold - Standing Hip Flexion with Anchored Resistance and Chair Support  - 1 x daily - 7 x weekly - 2 sets - 10 reps - 5 seconds hold  ASSESSMENT:  CLINICAL IMPRESSION: Patient appears to understand updated HEP.  Patient continues to benefit from skilled PT.   OBJECTIVE IMPAIRMENTS: decreased endurance, decreased ROM, decreased strength, impaired flexibility, and pain.   ACTIVITY LIMITATIONS: squatting, stairs, and transfers  PARTICIPATION LIMITATIONS: community activity, church, and farming  PERSONAL FACTORS: 1-2 comorbidities: previous fractures, LE neuropathy are also affecting patient's functional outcome.   REHAB POTENTIAL: Good  CLINICAL DECISION MAKING: Stable/uncomplicated  EVALUATION COMPLEXITY: Moderate   GOALS: Goals reviewed with patient? Yes  SHORT TERM GOALS: Target date: 01/10/2024  MET   Pt to be independent with HEP. Baseline: Goal status: MET 01/08/24  2.  Decrease pain by 1-2 levels. Baseline: 7/10 Goal status: MET   01/10/24  LONG TERM GOALS: Target date: 02/21/2024  10weeks      Pt to be independent with self progressive HEP at time of discharge. Baseline:  Goal status: Ongoing    01/22/2024  2.  Pt able to ambulate community distances without AD and no pain. Baseline: SPC Goal status: Ongoing     01/22/2024  3.  Increase L LE strength by 1/2 grade to 1 full grade. Baseline: 4- Goal status: Ongoing     01/22/2024  4.  Increase knee AROM to 0>105 active. Baseline: 5>90 Goal status: Ongoing     01/22/2024  5.  Decrease max pain to 2/10 with all  activities. Baseline: 7/10 Goal status: Ongoing     01/22/2024  6.  Increase PSFS score  by at least 2-3 points to demonstrate measurable difference. Baseline: 5.66 Goal status: Ongoing    01/22/2024   PLAN:  PT FREQUENCY: 2x/week  PT DURATION: 10  PLANNED INTERVENTIONS: 02835- PT Re-evaluation, 97110-Therapeutic exercises, 97530- Therapeutic activity, 97112- Neuromuscular re-education, 97535- Self Care, 02859- Manual therapy, [phone]- Gait training, [phone]- Aquatic Therapy, Patient/Family education, Balance training, Stair training, Joint mobilization, Scar mobilization, and Moist heat  PLAN FOR NEXT SESSION:     Continue with porgressive exercises, mobility and balance exercises.   Grayce Spatz, PT, DPT 01/24/2024, 12:45 PM

## 2024-01-31 ENCOUNTER — Encounter: Payer: Self-pay | Admitting: Physical Therapy

## 2024-01-31 ENCOUNTER — Ambulatory Visit (INDEPENDENT_AMBULATORY_CARE_PROVIDER_SITE_OTHER): Admitting: Physical Therapy

## 2024-01-31 DIAGNOSIS — M25562 Pain in left knee: Secondary | ICD-10-CM | POA: Diagnosis not present

## 2024-01-31 DIAGNOSIS — R600 Localized edema: Secondary | ICD-10-CM

## 2024-01-31 DIAGNOSIS — M6281 Muscle weakness (generalized): Secondary | ICD-10-CM | POA: Diagnosis not present

## 2024-01-31 DIAGNOSIS — M25662 Stiffness of left knee, not elsewhere classified: Secondary | ICD-10-CM

## 2024-01-31 DIAGNOSIS — M79662 Pain in left lower leg: Secondary | ICD-10-CM

## 2024-01-31 DIAGNOSIS — R2689 Other abnormalities of gait and mobility: Secondary | ICD-10-CM

## 2024-01-31 DIAGNOSIS — R2681 Unsteadiness on feet: Secondary | ICD-10-CM

## 2024-01-31 DIAGNOSIS — G8929 Other chronic pain: Secondary | ICD-10-CM

## 2024-01-31 NOTE — Therapy (Signed)
 OUTPATIENT PHYSICAL THERAPY LOWER EXTREMITY TREATMENT   Patient Name: Jeffrey [name] MRN: [id number] DOB:[birth year], 68 y.o., male Today's Date: 01/31/2024  END OF SESSION:  PT End of Session - 01/31/24 0849     Visit Number 10    Number of Visits 21    Date for Recertification  02/21/24    Authorization Type [facility]    Authorization Time Period [place] [id number] 9/17-2/5  15 VISITS    Authorization - Visit Number 10    Authorization - Number of Visits 15    PT Start Time 0845    PT Stop Time 0930    PT Time Calculation (min) 45 min    Activity Tolerance Patient tolerated treatment well    Behavior During Therapy WFL for tasks assessed/performed           Past Medical History:  Diagnosis Date   Aneurysm of infrarenal abdominal aorta    CAD (coronary artery disease)    4 stents   Carotid artery disease    Left ICA   Diabetes mellitus without complication (HCC)    Dysrhythmia    A. Fib   Hyperlipidemia    Intracerebral hemorrhage (HCC) 1960   Peripheral neuropathy    Post-traumatic osteoarthritis of left knee    Past Surgical History:  Procedure Laterality Date   APPENDECTOMY  1960   I & D EXTREMITY Right 06/19/2020   Procedure: IRRIGATION AND DEBRIDEMENT LONG FINGER;  Surgeon: Murrell Drivers, MD;  Location: [facility] OR;  Service: Orthopedics;  Laterality: Right;   LEFT HEART CATH  06/20/2018   ORIF FEMUR FRACTURE  1988   ORIF FEMUR FRACTURE Left 10/03/2023   Procedure: OPEN REDUCTION INTERNAL FIXATION (ORIF) DISTAL FEMUR FRACTURE;  Surgeon: Celena Sharper, MD;  Location: [facility] OR;  Service: Orthopedics;  Laterality: Left;   TOTAL KNEE ARTHROPLASTY Left 05/12/2023   Procedure: LEFT TOTAL KNEE ARTHROPLASTY;  Surgeon: Jerri Kay CHRISTELLA, MD;  Location: [facility] OR;  Service: Orthopedics;  Laterality: Left;   VARICOSE VEIN SURGERY Right    right leg   Patient Active Problem List   Diagnosis Date Noted   Periprosthetic fracture around internal prosthetic left knee joint 10/02/2023    Supracondylar fracture of left femur (HCC) 10/02/2023   Closed left femoral fracture (HCC) 10/02/2023   Hypokalemia 10/02/2023   Status post total left knee replacement 05/12/2023   Post-traumatic osteoarthritis of left knee 01/31/2023   Atrial fibrillation (HCC) 11/26/2021   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   Occlusion and stenosis of carotid artery without mention of cerebral infarction 11/19/2013    [facility]  REFERRING PROVIDER: Celena Sharper, MD , Ronal Grave PA  REFERRING DIAG: femur 15 visits  Closed fracture of left femur, unspecified fracture morphology, unspecified portion of femur, initial encounter (HCC)  - Primary S72.92XA   THERAPY DIAG:  Chronic pain of left knee  Muscle weakness (generalized)  Stiffness of left knee, not elsewhere classified  Localized edema  Unsteadiness on feet  Other abnormalities of gait and mobility  Pain in left lower leg  Rationale for Evaluation and Treatment: Rehabilitation  ONSET DATE: 10/03/23  SUBJECTIVE:   SUBJECTIVE STATEMENT:  Monday his leg felt the best he felt in months. Then he went shopping with wife and left knee buckled (no fall) but knee pain flared up.    PERTINENT HISTORY: L TKA 05/12/23,  L femur fx after fall 09/30/23 ORIF 10/03/23, this is 3rd L femur fx, CAD, DM, peripheral neuropathy  PAIN:  Are you having pain?  Yes: NPRS scale: sitting  0/10, on Monday (3 days ago) before buckled 1/10 standing & walking.  since buckled 7/10-9/10. When sits now only goes down to 2/10 Pain location: left thigh distal - lateral  Pain description: dull, sharp Aggravating factors: walking, standing Relieving factors: rest, pain meds at night  PRECAUTIONS: Other: + L TKA  RED FLAGS: 3rd L femur fracture   WEIGHT BEARING RESTRICTIONS: Yes WBAT  FALLS:  Has patient fallen in last 6 months? Yes. Number of falls 1  LIVING ENVIRONMENT: Lives with: lives with their spouse Lives in:  House/apartment Stairs: Yes: External: 4 steps; can reach both Has following equipment at home: Single point cane  OCCUPATION: retired   PLOF: Independent  PATIENT GOALS: decrease pain  NEXT MD VISIT: unknown  OBJECTIVE:  Note: Objective measures were completed at Evaluation unless otherwise noted.  DIAGNOSTIC FINDINGS: 11/08/23 X-rays demonstrate evidence of consolidation at the fracture site.  No  hardware complication.   PATIENT SURVEYS:  PSFS: THE PATIENT SPECIFIC FUNCTIONAL SCALE  Place score of 0-10 (0 = unable to perform activity and 10 = able to perform activity at the same level as before injury or problem)  Activity Date: 12/13/23    walking 7    2.transfers 8    3.stairs without handrails 2    4.      Total Score 5.6      Total Score = Sum of activity scores/number of activities  Minimally Detectable Change: 3 points (for single activity); 2 points (for average score)  [facility] (nd). The Patient Specific Functional Scale . Retrieved from [email or web address]   COGNITION: Overall cognitive status: Within functional limits for tasks assessed     SENSATION: + hx neuropathy feet  EDEMA: ---   MUSCLE LENGTH: Hamstrings: Right mod restriction  deg; Left Right mod restriction    POSTURE: flexed trunk   PALPATION: 1+ tenderness at ant knee and at lateral incision  LOWER EXTREMITY ROM: hip PROM [facility]  A/P ROM Left eval  Hip flexion 100  Hip extension   Hip abduction 40  Hip adduction   Hip internal rotation   Hip external rotation   Knee flexion 90/100  Knee extension +5  Ankle dorsiflexion   Ankle plantarflexion   Ankle inversion   Ankle eversion    (Blank rows = not tested)  LOWER EXTREMITY MMT:  MMT Left eval  Hip flexion 4-  Hip extension 4-  Hip abduction 4-  Hip adduction   Hip internal rotation   Hip external rotation   Knee flexion 4  Knee extension 4-  Ankle  dorsiflexion   Ankle plantarflexion   Ankle inversion   Ankle eversion    (Blank rows = not tested)  LOWER EXTREMITY SPECIAL TESTS: none   FUNCTIONAL TESTS:  5 times sit to stand: 15 sec with UE use and inc sway/neuropathy  GAIT: Distance walked: short community Assistive device utilized: Single point cane Level of assistance: Modified independence Comments: ---  TODAY'S TREATMENT:   L LE 01/31/2024 Therapeutic Exercise recumbent bike seat 9 level 1 for 8 min.  Quad stretch supine with strap knee flexion with PT cues on set-up to limit abduction.  30 sec 3 reps.  Pt verbalized understanding.    Self-care: PT verbally instructed patient in components of quad muscle including patella, quad tendon & patella tendon. When his knee buckles this will typically cause a quick stretch to quad muscle and quick high intensity contraction to prevent full buckle & fall.  PT discussed that his knee seems stronger but may be only functioning in straight line weight shifts.  So PT adding some angle movements.  Pt verbalized understanding.  PT reviewed ice massage to help pain response since he can only take Tylenol  which does not help with inflammation much.  Pt able to return demo understanding.    Therapeutic Activities: PT verbally discussed walking with LLE stiff to protect buckling, may increase his quad pain with maintaining longer contraction.  If his quad is inflamed he may benefit from going back onto RW for community activities until it calms down.  Pt verbalized understanding.  LLE stance knee flexion on outward motions and ext on inward motions. LUE on sink & RUE cane. Stepping RLE to 4 corners of rectangle and returning to bilateral stance.  10 reps with improved left knee control.   Standing at counter BUE support side stepping engaging abductors to push off and  facilitates weight shift onto foot stepping.  Progressed same motion to turning foot stepping 90* to change walking directions.  Pt performed to right & to left using chair back for support with weight transition.      TREATMENT:   L LE 01/24/2024 Therapeutic Exercise recumbent bike seat 9 level 3 for 8 min.  Gastroc stretch on step heel depression 30 sec and attempting BLE heel raises but strength unable to lift heels even with BUE support //bars Hamstring stretch standing on step with foot DF 30 sec hold 3 reps Seated BLEs planterflexion blue theraband 10 reps 2 sets and PF with inversion & eversion 10 reps 2 sets Standing BLEs alternating blue Theraband figure 8 above knees 10 reps ea abduction, extension & SLR flexion with stance LE controlled knee flexion simulating terminal stance.  PT updated HEP to include above exercises and pt verbalized understanding after performing in clinic.     TREATMENT:   L LE 01/22/2024 Therapeutic Exercise SciFit recumbent bike seat 13 level 1 with BLEs & BUEs  for 4 min then BLEs only 4 min.  Gastroc stretch on step heel depression 30 sec and attempting BLE heel raises but strength unable to lift heels even with BUE support //bars Hamstring stretch standing on step with foot DF 30 sec hold 3 reps  Therapeutic Activities  PT demo & verbal cues on sit to stand & stand to sit technique.  Pt performed 10 reps from 18 chair with armrests and 6 reps from 18 chair without armrests using BUEs on seat.  Pt able to improve balance with proper technique.   PT demo & verbal cues on technique for step up & step down using LLE.  Step onto 4 step 10 reps 1st set BUE //bars, then 2nd set with R hand only Rocker board (square with 2 pivot points) ant/ mid/ post with ankle & pelvic weight shift with BUE // bars light support & PT tactile cues 1 min 2 reps.  Mirror to facilitate upright posture.  TREATMENT:   L LE 01/19/24 Therapeutic Exercise Nustep level 5   for 8 min.  Hamstring stretch LLE long sit with strap DF 30 sec hold 3 reps Lateral hip stretch - SLR with strap & adduction 30 sec hold 3 reps L LE LAQ 4# 3x10 SAQ 4# 3x10 Side lying clamshells 3x10 Therapeutic Activities  Seated marching 2x10 with 4# on ankle Seated hip adduction ball squeeze 10x10 sec Step 8 taps forward and lateral 10x each no UE use Step onto 4 step 2x10 1 set with R hand only, 1 set with no hand use   HOME EXERCISE PROGRAM: Access Code: [email or web address] URL: [email or web address] Date: 01/24/2024 Prepared by: Grayce Spatz  Exercises - Long Sitting Quad Set with Towel Roll Under Heel  - 2 x daily - 7 x weekly - 2 sets - 10 reps - 2 hold - Active Straight Leg Raise with Quad Set  - 2 x daily - 7 x weekly - 2 sets - 10 reps - Supine Heel Slides  - 2 x daily - 7 x weekly - 2 sets - 10 reps - 2 hold - Supine Bridge  - 2 x daily - 7 x weekly - 2 sets - 10 reps - 2 hold - Mini Squat with Counter Support  - 2 x daily - 7 x weekly - 2 sets - 10 reps - Seated Table Hamstring Stretch  - 1 x daily - 7 x weekly - 1 sets - 3 reps - 30 seconds hold - Supine ITB Stretch with Strap  - 1 x daily - 7 x weekly - 1 sets - 3 reps - 30 seconds hold - Seated Hip Internal Rotation AROM  - 1 x daily - 7 x weekly - 2 sets - 10 reps - 5 seconds hold - Seated Hip External Rotation AROM  - 1 x daily - 7 x weekly - 2 sets - 10 reps - 5 seconds hold - Gastroc Stretch on Step  - 1 x daily - 7 x weekly - 1 sets - 3 reps - 30 seconds hold - Standing Hamstring Stretch with Step  - 1 x daily - 7 x weekly - 1 sets - 3 reps - 30 seconds hold - Seated Ankle Plantar Flexion with Resistance Loop  - 1 x daily - 7 x weekly - 2 sets - 10 reps - 5 seconds hold - Long sitting ankle plantarflexion with eversion  - 1 x daily - 7 x weekly - 2 sets - 10 reps - 5 seconds hold - Long sitting ankle plantarflexion with eversion  - 1 x daily - 7 x weekly - 2 sets - 10 reps - 5 seconds hold - Standing Hip  Abduction with Resistance at Thighs  - 1 x daily - 7 x weekly - 2 sets - 10 reps - 5 seconds hold - Standing Hip Extension with Resistance at Ankles and Counter Support  - 1 x daily - 7 x weekly - 2 sets - 10 reps - 5 seconds hold - Standing Hip Flexion with Anchored Resistance and Chair Support  - 1 x daily - 7 x weekly - 2 sets - 10 reps - 5 seconds hold  ASSESSMENT:  CLINICAL IMPRESSION: Patient reporting knee was improved until it buckled 3 days ago. His knee has been sore since that buckling.  PT educated on self-care to calm down quad pain which he appears to understand.  PT also began work on closed chain quad control with direction changes  and diagonal motions which he improved. Patient arrived to PT session with significant antalgic gait decreasing left stance duration.  At end of session improved but still decreased to RLE stance.  Patient continues to benefit from skilled PT.   OBJECTIVE IMPAIRMENTS: decreased endurance, decreased ROM, decreased strength, impaired flexibility, and pain.   ACTIVITY LIMITATIONS: squatting, stairs, and transfers  PARTICIPATION LIMITATIONS: community activity, church, and farming  PERSONAL FACTORS: 1-2 comorbidities: previous fractures, LE neuropathy are also affecting patient's functional outcome.   REHAB POTENTIAL: Good  CLINICAL DECISION MAKING: Stable/uncomplicated  EVALUATION COMPLEXITY: Moderate   GOALS: Goals reviewed with patient? Yes  SHORT TERM GOALS: Target date: 01/10/2024  MET   Pt to be independent with HEP. Baseline: Goal status: MET 01/08/24  2.  Decrease pain by 1-2 levels. Baseline: 7/10 Goal status: MET  01/10/24  LONG TERM GOALS: Target date: 02/21/2024  10weeks      Pt to be independent with self progressive HEP at time of discharge. Baseline:  Goal status: Ongoing      01/31/2024  2.  Pt able to ambulate community distances without AD and no pain. Baseline: SPC Goal status: Ongoing       01/31/2024  3.   Increase L LE strength by 1/2 grade to 1 full grade. Baseline: 4- Goal status: Ongoing      01/31/2024  4.  Increase knee AROM to 0>105 active. Baseline: 5>90 Goal status: Ongoing      01/31/2024  5.  Decrease max pain to 2/10 with all activities. Baseline: 7/10 Goal status: Ongoing     01/31/2024  6.  Increase PSFS score  by at least 2-3 points to demonstrate measurable difference. Baseline: 5.66 Goal status: Ongoing    01/31/2024   PLAN:  PT FREQUENCY: 2x/week  PT DURATION: 10  PLANNED INTERVENTIONS: 02835- PT Re-evaluation, 97110-Therapeutic exercises, 97530- Therapeutic activity, 97112- Neuromuscular re-education, 97535- Self Care, 02859- Manual therapy, [phone]- Gait training, [phone]- Aquatic Therapy, Patient/Family education, Balance training, Stair training, Joint mobilization, Scar mobilization, and Moist heat  PLAN FOR NEXT SESSION:  check on right knee pain.  Continue closed chain strength including multiple planes.     Continue with porgressive exercises, mobility and balance exercises.   Grayce Spatz, PT, DPT 01/31/2024, 11:34 AM

## 2024-02-02 ENCOUNTER — Other Ambulatory Visit (HOSPITAL_COMMUNITY): Payer: Self-pay | Admitting: Physician Assistant

## 2024-02-02 DIAGNOSIS — Z87891 Personal history of nicotine dependence: Secondary | ICD-10-CM

## 2024-02-05 ENCOUNTER — Ambulatory Visit: Admitting: Physical Therapy

## 2024-02-05 ENCOUNTER — Encounter: Payer: Self-pay | Admitting: Physical Therapy

## 2024-02-05 DIAGNOSIS — M25662 Stiffness of left knee, not elsewhere classified: Secondary | ICD-10-CM | POA: Diagnosis not present

## 2024-02-05 DIAGNOSIS — G8929 Other chronic pain: Secondary | ICD-10-CM

## 2024-02-05 DIAGNOSIS — M6281 Muscle weakness (generalized): Secondary | ICD-10-CM

## 2024-02-05 DIAGNOSIS — R2689 Other abnormalities of gait and mobility: Secondary | ICD-10-CM

## 2024-02-05 DIAGNOSIS — R2681 Unsteadiness on feet: Secondary | ICD-10-CM

## 2024-02-05 DIAGNOSIS — R600 Localized edema: Secondary | ICD-10-CM

## 2024-02-05 DIAGNOSIS — M25562 Pain in left knee: Secondary | ICD-10-CM | POA: Diagnosis not present

## 2024-02-05 NOTE — Therapy (Signed)
 OUTPATIENT PHYSICAL THERAPY LOWER EXTREMITY TREATMENT   Patient Name: Jeffrey [name] MRN: [id number] DOB:12-05-1955, 68 y.o., male Today's Date: 02/05/2024  END OF SESSION:  PT End of Session - 02/05/24 1102     Visit Number 11    Number of Visits 21    Date for Recertification  02/21/24    Authorization Type [place]    Authorization Time Period [place] [id number] 9/17-2/5  15 VISITS    Authorization - Visit Number 11    Authorization - Number of Visits 15    PT Start Time 1100    PT Stop Time 1144    PT Time Calculation (min) 44 min    Activity Tolerance Patient tolerated treatment well    Behavior During Therapy WFL for tasks assessed/performed            Past Medical History:  Diagnosis Date   Aneurysm of infrarenal abdominal aorta    CAD (coronary artery disease)    4 stents   Carotid artery disease    Left ICA   Diabetes mellitus without complication (HCC)    Dysrhythmia    A. Fib   Hyperlipidemia    Intracerebral hemorrhage (HCC) 1960   Peripheral neuropathy    Post-traumatic osteoarthritis of left knee    Past Surgical History:  Procedure Laterality Date   APPENDECTOMY  1960   I & D EXTREMITY Right 06/19/2020   Procedure: IRRIGATION AND DEBRIDEMENT LONG FINGER;  Surgeon: Murrell Drivers, MD;  Location: [facility] OR;  Service: Orthopedics;  Laterality: Right;   LEFT HEART CATH  06/20/2018   ORIF FEMUR FRACTURE  1988   ORIF FEMUR FRACTURE Left 10/03/2023   Procedure: OPEN REDUCTION INTERNAL FIXATION (ORIF) DISTAL FEMUR FRACTURE;  Surgeon: Celena Sharper, MD;  Location: [facility] OR;  Service: Orthopedics;  Laterality: Left;   TOTAL KNEE ARTHROPLASTY Left 05/12/2023   Procedure: LEFT TOTAL KNEE ARTHROPLASTY;  Surgeon: Jerri Kay CHRISTELLA, MD;  Location: [facility] OR;  Service: Orthopedics;  Laterality: Left;   VARICOSE VEIN SURGERY Right    right leg   Patient Active Problem List   Diagnosis Date Noted   Periprosthetic fracture around internal prosthetic left knee joint 10/02/2023    Supracondylar fracture of left femur (HCC) 10/02/2023   Closed left femoral fracture (HCC) 10/02/2023   Hypokalemia 10/02/2023   Status post total left knee replacement 05/12/2023   Post-traumatic osteoarthritis of left knee 01/31/2023   Atrial fibrillation (HCC) 11/26/2021   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   Occlusion and stenosis of carotid artery without mention of cerebral infarction 11/19/2013    [facility]  REFERRING PROVIDER: Celena Sharper, MD , Ronal Grave PA  REFERRING DIAG: femur 15 visits  Closed fracture of left femur, unspecified fracture morphology, unspecified portion of femur, initial encounter (HCC)  - Primary S72.92XA   THERAPY DIAG:  Chronic pain of left knee  Muscle weakness (generalized)  Stiffness of left knee, not elsewhere classified  Localized edema  Unsteadiness on feet  Other abnormalities of gait and mobility  Rationale for Evaluation and Treatment: Rehabilitation  ONSET DATE: 10/03/23  SUBJECTIVE:   SUBJECTIVE STATEMENT:  His leg feels better. He followed PT recommendations from last session which helped.  He is still waiting for CAT scan of LE.    PERTINENT HISTORY: L TKA 05/12/23,  L femur fx after fall 09/30/23 ORIF 10/03/23, this is 3rd L femur fx, CAD, DM, peripheral neuropathy  PAIN:  Are you having pain?   Yes: NPRS scale: since last PT sitting  0/10, with activities 4-5/10  Pain location: left thigh distal - lateral  Pain description: dull, sharp Aggravating factors: walking, standing Relieving factors: rest, pain meds at night  PRECAUTIONS: Other: + L TKA  RED FLAGS: 3rd L femur fracture   WEIGHT BEARING RESTRICTIONS: Yes WBAT  FALLS:  Has patient fallen in last 6 months? Yes. Number of falls 1  LIVING ENVIRONMENT: Lives with: lives with their spouse Lives in: House/apartment Stairs: Yes: External: 4 steps; can reach both Has following equipment at home: Single point cane  OCCUPATION: retired    PLOF: Independent  PATIENT GOALS: decrease pain  NEXT MD VISIT: unknown  OBJECTIVE:  Note: Objective measures were completed at Evaluation unless otherwise noted.  DIAGNOSTIC FINDINGS: 11/08/23 X-rays demonstrate evidence of consolidation at the fracture site.  No  hardware complication.   PATIENT SURVEYS:  PSFS: THE PATIENT SPECIFIC FUNCTIONAL SCALE  Place score of 0-10 (0 = unable to perform activity and 10 = able to perform activity at the same level as before injury or problem)  Activity Date: 12/13/23    walking 7    2.transfers 8    3.stairs without handrails 2    4.      Total Score 5.6      Total Score = Sum of activity scores/number of activities  Minimally Detectable Change: 3 points (for single activity); 2 points (for average score)  [facility] (nd). The Patient Specific Functional Scale . Retrieved from [email or web address]   COGNITION: Overall cognitive status: Within functional limits for tasks assessed     SENSATION: + hx neuropathy feet  EDEMA: ---   MUSCLE LENGTH: Hamstrings: Right mod restriction  deg; Left Right mod restriction    POSTURE: flexed trunk   PALPATION: 1+ tenderness at ant knee and at lateral incision  LOWER EXTREMITY ROM: hip PROM [facility]  A/P ROM Left eval  Hip flexion 100  Hip extension   Hip abduction 40  Hip adduction   Hip internal rotation   Hip external rotation   Knee flexion 90/100  Knee extension +5  Ankle dorsiflexion   Ankle plantarflexion   Ankle inversion   Ankle eversion    (Blank rows = not tested)  LOWER EXTREMITY MMT:  MMT Left eval  Hip flexion 4-  Hip extension 4-  Hip abduction 4-  Hip adduction   Hip internal rotation   Hip external rotation   Knee flexion 4  Knee extension 4-  Ankle dorsiflexion   Ankle plantarflexion   Ankle inversion   Ankle eversion    (Blank rows = not tested)  LOWER EXTREMITY SPECIAL TESTS:  none   FUNCTIONAL TESTS:  5 times sit to stand: 15 sec with UE use and inc sway/neuropathy  GAIT: Distance walked: short community Assistive device utilized: Single point cane Level of assistance: Modified independence Comments: ---  TODAY'S TREATMENT:   L LE 02/05/2024 Therapeutic Exercise Sci[employer] recumbent bike with foot pedal in notch 2 (increased ROM) seat 13 level 1 for 8 min.   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  Pt able to climb 2 rungs with step to pattern.  Terminal stance LLE knee flexion with weight shift over toes.  Progressed to amb in //bars with focus on terminal stance / swing knee flexion and weight acceptance / stance knee ext.   Side stepping engaging outside leg / push off to facilitate improved weight shift.  Progressed to using technique to turn 90* to walk away from stationary position.  PT demo & verbal cues on technique including to right & to left.   Stepping over 6 hurdles (5) with one foot bw each hurdle working on knee flexion to clear and knee ext for stance. 10 reps over 5 hurdles progressing from BUE to RUE to LUE to intermittent UE support.   PT demo & verbal cues on using bar stool as modified standing (trunk/UEs/eyes) higher than sitting in 18 chair and sit to/from stand.  Pt able to return demo with 24 bar stool with 3 reps (1 needing minA for stabilization).  Pt able to sit on 29 mat table with pelvis & feet supported and sit/stand safely.  Pt verbalized understanding of function.     TREATMENT:   L LE 01/31/2024 Therapeutic Exercise recumbent bike seat 9 level 1 for 8 min.  Quad stretch supine with strap knee flexion with PT cues on set-up to limit abduction.  30 sec 3 reps.  Pt verbalized understanding.    Self-care: PT  verbally instructed patient in components of quad muscle including patella, quad tendon & patella tendon. When his knee buckles this will typically cause a quick stretch to quad muscle and quick high intensity contraction to prevent full buckle & fall.  PT discussed that his knee seems stronger but may be only functioning in straight line weight shifts.  So PT adding some angle movements.  Pt verbalized understanding.  PT reviewed ice massage to help pain response since he can only take Tylenol  which does not help with inflammation much.  Pt able to return demo understanding.    Therapeutic Activities: PT verbally discussed walking with LLE stiff to protect buckling, may increase his quad pain with maintaining longer contraction.  If his quad is inflamed he may benefit from going back onto RW for community activities until it calms down.  Pt verbalized understanding.  LLE stance knee flexion on outward motions and ext on inward motions. LUE on sink & RUE cane. Stepping RLE to 4 corners of rectangle and returning to bilateral stance.  10 reps with improved left knee control.   Standing at counter BUE support side stepping engaging abductors to push off and facilitates weight shift onto foot stepping.  Progressed same motion to turning foot stepping 90* to change walking directions.  Pt performed to right & to left using chair back for support with weight transition.      TREATMENT:   L LE 01/24/2024 Therapeutic Exercise recumbent bike seat 9 level 3 for 8 min.  Gastroc stretch on step heel depression 30 sec and attempting BLE heel raises but strength unable to lift heels even with BUE support //bars Hamstring stretch standing on step with foot DF 30 sec hold 3 reps Seated BLEs planterflexion blue theraband 10 reps 2 sets and  PF with inversion & eversion 10 reps 2 sets Standing BLEs alternating blue Theraband figure 8 above knees 10 reps ea abduction, extension & SLR flexion with stance LE controlled  knee flexion simulating terminal stance.  PT updated HEP to include above exercises and pt verbalized understanding after performing in clinic.    TREATMENT:   L LE 01/22/2024 Therapeutic Exercise SciFit recumbent bike seat 13 level 1 with BLEs & BUEs  for 4 min then BLEs only 4 min.  Gastroc stretch on step heel depression 30 sec and attempting BLE heel raises but strength unable to lift heels even with BUE support //bars Hamstring stretch standing on step with foot DF 30 sec hold 3 reps  Therapeutic Activities  PT demo & verbal cues on sit to stand & stand to sit technique.  Pt performed 10 reps from 18 chair with armrests and 6 reps from 18 chair without armrests using BUEs on seat.  Pt able to improve balance with proper technique.   PT demo & verbal cues on technique for step up & step down using LLE.  Step onto 4 step 10 reps 1st set BUE //bars, then 2nd set with R hand only Rocker board (square with 2 pivot points) ant/ mid/ post with ankle & pelvic weight shift with BUE // bars light support & PT tactile cues 1 min 2 reps.  Mirror to facilitate upright posture.    HOME EXERCISE PROGRAM: Access Code: [email or web address] URL: [email or web address] Date: 01/24/2024 Prepared by: Grayce Spatz  Exercises - Long Sitting Quad Set with Towel Roll Under Heel  - 2 x daily - 7 x weekly - 2 sets - 10 reps - 2 hold - Active Straight Leg Raise with Quad Set  - 2 x daily - 7 x weekly - 2 sets - 10 reps - Supine Heel Slides  - 2 x daily - 7 x weekly - 2 sets - 10 reps - 2 hold - Supine Bridge  - 2 x daily - 7 x weekly - 2 sets - 10 reps - 2 hold - Mini Squat with Counter Support  - 2 x daily - 7 x weekly - 2 sets - 10 reps - Seated Table Hamstring Stretch  - 1 x daily - 7 x weekly - 1 sets - 3 reps - 30 seconds hold - Supine ITB Stretch with Strap  - 1 x daily - 7 x weekly - 1 sets - 3 reps - 30 seconds hold - Seated Hip Internal Rotation AROM  - 1 x daily - 7 x weekly - 2 sets - 10 reps  - 5 seconds hold - Seated Hip External Rotation AROM  - 1 x daily - 7 x weekly - 2 sets - 10 reps - 5 seconds hold - Gastroc Stretch on Step  - 1 x daily - 7 x weekly - 1 sets - 3 reps - 30 seconds hold - Standing Hamstring Stretch with Step  - 1 x daily - 7 x weekly - 1 sets - 3 reps - 30 seconds hold - Seated Ankle Plantar Flexion with Resistance Loop  - 1 x daily - 7 x weekly - 2 sets - 10 reps - 5 seconds hold - Long sitting ankle plantarflexion with eversion  - 1 x daily - 7 x weekly - 2 sets - 10 reps - 5 seconds hold - Long sitting ankle plantarflexion with eversion  - 1 x daily - 7 x weekly - 2 sets - 10 reps -  5 seconds hold - Standing Hip Abduction with Resistance at Thighs  - 1 x daily - 7 x weekly - 2 sets - 10 reps - 5 seconds hold - Standing Hip Extension with Resistance at Ankles and Counter Support  - 1 x daily - 7 x weekly - 2 sets - 10 reps - 5 seconds hold - Standing Hip Flexion with Anchored Resistance and Chair Support  - 1 x daily - 7 x weekly - 2 sets - 10 reps - 5 seconds hold  ASSESSMENT:  CLINICAL IMPRESSION: Patient improved standing functional activities with PT instruction and repetition.  He continues to have pain and weakness impacting his overall function.  Patient continues to benefit from skilled PT.   OBJECTIVE IMPAIRMENTS: decreased endurance, decreased ROM, decreased strength, impaired flexibility, and pain.   ACTIVITY LIMITATIONS: squatting, stairs, and transfers  PARTICIPATION LIMITATIONS: community activity, church, and farming  PERSONAL FACTORS: 1-2 comorbidities: previous fractures, LE neuropathy are also affecting patient's functional outcome.   REHAB POTENTIAL: Good  CLINICAL DECISION MAKING: Stable/uncomplicated  EVALUATION COMPLEXITY: Moderate   GOALS: Goals reviewed with patient? Yes  SHORT TERM GOALS: Target date: 01/10/2024  MET   Pt to be independent with HEP. Baseline: Goal status: MET 01/08/24  2.  Decrease pain by 1-2  levels. Baseline: 7/10 Goal status: MET  01/10/24  LONG TERM GOALS: Target date: 02/21/2024  10weeks  Pt to be independent with self progressive HEP at time of discharge. Baseline:  Goal status: Ongoing       02/05/2024  2.  Pt able to ambulate community distances without AD and no pain. Baseline: SPC Goal status: Ongoing      02/05/2024  3.  Increase L LE strength by 1/2 grade to 1 full grade. Baseline: 4- Goal status: Ongoing      02/05/2024  4.  Increase knee AROM to 0>105 active. Baseline: 5>90 Goal status: Ongoing     02/05/2024  5.  Decrease max pain to 2/10 with all activities. Baseline: 7/10 Goal status: Ongoing     02/05/2024  6.  Increase PSFS score  by at least 2-3 points to demonstrate measurable difference. Baseline: 5.66 Goal status: Ongoing    02/05/2024   PLAN:  PT FREQUENCY: 2x/week  PT DURATION: 10  PLANNED INTERVENTIONS: 02835- PT Re-evaluation, 97110-Therapeutic exercises, 97530- Therapeutic activity, 97112- Neuromuscular re-education, 97535- Self Care, 02859- Manual therapy, [phone]- Gait training, [phone]- Aquatic Therapy, Patient/Family education, Balance training, Stair training, Joint mobilization, Scar mobilization, and Moist heat  PLAN FOR NEXT SESSION: Continue closed chain strength including multiple planes.     Continue with porgressive exercises, mobility and balance exercises.   Grayce Spatz, PT, DPT 02/05/2024, 12:57 PM

## 2024-02-07 ENCOUNTER — Encounter: Payer: Self-pay | Admitting: Physical Therapy

## 2024-02-07 ENCOUNTER — Ambulatory Visit: Admitting: Physical Therapy

## 2024-02-07 DIAGNOSIS — R2689 Other abnormalities of gait and mobility: Secondary | ICD-10-CM

## 2024-02-07 DIAGNOSIS — M25662 Stiffness of left knee, not elsewhere classified: Secondary | ICD-10-CM

## 2024-02-07 DIAGNOSIS — M6281 Muscle weakness (generalized): Secondary | ICD-10-CM | POA: Diagnosis not present

## 2024-02-07 DIAGNOSIS — M25562 Pain in left knee: Secondary | ICD-10-CM

## 2024-02-07 DIAGNOSIS — R2681 Unsteadiness on feet: Secondary | ICD-10-CM

## 2024-02-07 DIAGNOSIS — R600 Localized edema: Secondary | ICD-10-CM | POA: Diagnosis not present

## 2024-02-07 DIAGNOSIS — G8929 Other chronic pain: Secondary | ICD-10-CM

## 2024-02-07 NOTE — Therapy (Signed)
 OUTPATIENT PHYSICAL THERAPY LOWER EXTREMITY TREATMENT   Patient Name: Jeffrey [name] MRN: [id number] DOB:05/10/1955, 68 y.o., male Today's Date: 02/07/2024  END OF SESSION:  PT End of Session - 02/07/24 0924     Visit Number 12    Number of Visits 21    Date for Recertification  02/21/24    Authorization Type [facility]    Authorization Time Period [place] [id number] 9/17-2/5  15 VISITS    Authorization - Visit Number 12    Authorization - Number of Visits 15    PT Start Time 0922    PT Stop Time 1015    PT Time Calculation (min) 53 min    Activity Tolerance Patient tolerated treatment well    Behavior During Therapy WFL for tasks assessed/performed             Past Medical History:  Diagnosis Date   Aneurysm of infrarenal abdominal aorta    CAD (coronary artery disease)    4 stents   Carotid artery disease    Left ICA   Diabetes mellitus without complication (HCC)    Dysrhythmia    A. Fib   Hyperlipidemia    Intracerebral hemorrhage (HCC) 1960   Peripheral neuropathy    Post-traumatic osteoarthritis of left knee    Past Surgical History:  Procedure Laterality Date   APPENDECTOMY  1960   I & D EXTREMITY Right 06/19/2020   Procedure: IRRIGATION AND DEBRIDEMENT LONG FINGER;  Surgeon: Murrell Drivers, MD;  Location: [facility] OR;  Service: Orthopedics;  Laterality: Right;   LEFT HEART CATH  06/20/2018   ORIF FEMUR FRACTURE  1988   ORIF FEMUR FRACTURE Left 10/03/2023   Procedure: OPEN REDUCTION INTERNAL FIXATION (ORIF) DISTAL FEMUR FRACTURE;  Surgeon: Celena Sharper, MD;  Location: [facility] OR;  Service: Orthopedics;  Laterality: Left;   TOTAL KNEE ARTHROPLASTY Left 05/12/2023   Procedure: LEFT TOTAL KNEE ARTHROPLASTY;  Surgeon: Jerri Kay CHRISTELLA, MD;  Location: [facility] OR;  Service: Orthopedics;  Laterality: Left;   VARICOSE VEIN SURGERY Right    right leg   Patient Active Problem List   Diagnosis Date Noted   Periprosthetic fracture around internal prosthetic left knee joint 10/02/2023    Supracondylar fracture of left femur (HCC) 10/02/2023   Closed left femoral fracture (HCC) 10/02/2023   Hypokalemia 10/02/2023   Status post total left knee replacement 05/12/2023   Post-traumatic osteoarthritis of left knee 01/31/2023   Atrial fibrillation (HCC) 11/26/2021   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   Occlusion and stenosis of carotid artery without mention of cerebral infarction 11/19/2013    [facility]  REFERRING PROVIDER: Celena Sharper, MD , Ronal Grave PA  REFERRING DIAG: femur 15 visits  Closed fracture of left femur, unspecified fracture morphology, unspecified portion of femur, initial encounter (HCC)  - Primary S72.92XA   THERAPY DIAG:  Chronic pain of left knee  Muscle weakness (generalized)  Stiffness of left knee, not elsewhere classified  Localized edema  Unsteadiness on feet  Other abnormalities of gait and mobility  Rationale for Evaluation and Treatment: Rehabilitation  ONSET DATE: 10/03/23  SUBJECTIVE:   SUBJECTIVE STATEMENT:  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    PERTINENT HISTORY: L TKA 05/12/23,  L femur fx after fall 09/30/23 ORIF 10/03/23, this is 3rd L femur fx, CAD, DM, peripheral neuropathy  PAIN:  Are you having pain?   Yes: NPRS scale: since last PT sitting 0/10, with  activities 4/10  Pain location: left thigh distal - lateral  Pain description: dull, sharp Aggravating factors: walking, standing Relieving factors: rest, pain meds at night  PRECAUTIONS: Other: + L TKA  RED FLAGS: 3rd L femur fracture   WEIGHT BEARING RESTRICTIONS: Yes WBAT  FALLS:  Has patient fallen in last 6 months? Yes. Number of falls 1  LIVING ENVIRONMENT: Lives with: lives with their spouse Lives in: House/apartment Stairs: Yes: External: 4 steps; can reach both Has following equipment at home: Single point cane  OCCUPATION: retired   PLOF:  Independent  PATIENT GOALS: decrease pain  NEXT MD VISIT: unknown  OBJECTIVE:  Note: Objective measures were completed at Evaluation unless otherwise noted.  DIAGNOSTIC FINDINGS: 11/08/23 X-rays demonstrate evidence of consolidation at the fracture site.  No  hardware complication.   PATIENT SURVEYS:  PSFS: THE PATIENT SPECIFIC FUNCTIONAL SCALE  Place score of 0-10 (0 = unable to perform activity and 10 = able to perform activity at the same level as before injury or problem)  Activity Date: 12/13/23 02/07/24   walking 7 8   2.transfers (stand/sit & getting on/off tractor) 8 9   3.stairs  3 without handrails and >3 with handrail 2  5   4.      Total Score 5.6 7.33     Total Score = Sum of activity scores/number of activities  Minimally Detectable Change: 3 points (for single activity); 2 points (for average score)  [facility] (nd). The Patient Specific Functional Scale . Retrieved from [email or web address]   COGNITION: Overall cognitive status: Within functional limits for tasks assessed     SENSATION: + hx neuropathy feet  EDEMA: ---   MUSCLE LENGTH: Hamstrings: Right mod restriction  deg; Left Right mod restriction    POSTURE: flexed trunk   PALPATION: 1+ tenderness at ant knee and at lateral incision  LOWER EXTREMITY ROM: hip PROM [place]  A/P ROM Left eval  Hip flexion 100  Hip extension   Hip abduction 40  Hip adduction   Hip internal rotation   Hip external rotation   Knee flexion 90/100  Knee extension +5  Ankle dorsiflexion   Ankle plantarflexion   Ankle inversion   Ankle eversion    (Blank rows = not tested)  LOWER EXTREMITY MMT:  MMT Left eval  Hip flexion 4-  Hip extension 4-  Hip abduction 4-  Hip adduction   Hip internal rotation   Hip external rotation   Knee flexion 4  Knee extension 4-  Ankle dorsiflexion   Ankle plantarflexion   Ankle inversion   Ankle eversion     (Blank rows = not tested)  LOWER EXTREMITY SPECIAL TESTS: none   FUNCTIONAL TESTS:  5 times sit to stand: 15 sec with UE use and inc sway/neuropathy  GAIT: Distance walked: short community Assistive device utilized: Single point cane Level of assistance: Modified independence Comments: ---  TODAY'S TREATMENT:   L LE 02/07/2024 Therapeutic Exercise recumbent bike seat 9 level 4 for 8 min.   Therapeutic Activities: Stepping on, over and down on BOSU with BUE support //bars 10 reps ea LE: when LLE on BOSU working on lift & lowering power and when RLE on BOSU working on LLE active knee flexion to clear & loading / landing on LLE with knee stability.   Side stepping engaging outside leg / push off to facilitate improved weight shift 1st lap RUE support then 3 laps without UE support.  Progressed to using technique to turn 90* to walk away from stationary position.  PT verbal cues on technique including to right & to left.   Side stepping on foam beam including transitioning firm to/from compliant surface with light touch (trying minimize weight bearing on UEs)  3 laps.   Stepping over 6 hurdles (6) with one foot bw each hurdle working on knee flexion to clear and knee ext for stance. 10 reps over 6 hurdles progressing from BUE to RUE to LUE to intermittent UE support.   Stepping towards 4 cones (ant, ant-lat, lat & post-lat) with focus on ankle / knee / hip control rotating upper body over stance limb.  3 reps to each cone leading with LLE & with RLE. Intermittent touch counter & PT minA for balance.     TREATMENT:   L LE 02/05/2024 Therapeutic Exercise [employer] recumbent bike with foot pedal in notch 2 (increased ROM) seat 13 level 1 for 8 min.   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  Pt able to climb 2 rungs with step to pattern.  Terminal stance LLE knee flexion with weight shift over toes.  Progressed to amb in //bars with focus on terminal stance / swing knee flexion and weight acceptance / stance knee ext.   Side stepping engaging outside leg / push off to facilitate improved weight shift.  Progressed to using technique to turn 90* to walk away from stationary position.  PT demo & verbal cues on technique including to right & to left.   Stepping over 6 hurdles (5) with one foot bw each hurdle working on knee flexion to clear and knee ext for stance. 10 reps over 5 hurdles progressing from BUE to RUE to LUE to intermittent UE support.   PT demo & verbal cues on using bar stool as modified standing (trunk/UEs/eyes) higher than sitting in 18 chair and sit to/from stand.  Pt able to return demo with 24 bar stool with 3 reps (1 needing minA for stabilization).  Pt able to sit on 29 mat table with pelvis & feet supported and sit/stand safely.  Pt verbalized understanding of function.     TREATMENT:   L LE 01/31/2024 Therapeutic Exercise recumbent bike seat 9 level 1 for 8 min.  Quad stretch supine with strap knee flexion with PT cues on set-up to limit abduction.  30 sec 3 reps.  Pt verbalized understanding.    Self-care: PT verbally instructed patient in components of quad muscle including patella, quad tendon & patella tendon. When his knee buckles this will typically cause a quick stretch to quad muscle and quick high intensity contraction to prevent full buckle & fall.  PT discussed that his knee seems stronger but may be only functioning in straight line weight shifts.  So PT adding some angle movements.  Pt verbalized understanding.  PT reviewed  ice massage to help pain response since he can only take Tylenol  which does not help with inflammation much.  Pt able to return demo understanding.    Therapeutic Activities: PT verbally discussed walking with LLE  stiff to protect buckling, may increase his quad pain with maintaining longer contraction.  If his quad is inflamed he may benefit from going back onto RW for community activities until it calms down.  Pt verbalized understanding.  LLE stance knee flexion on outward motions and ext on inward motions. LUE on sink & RUE cane. Stepping RLE to 4 corners of rectangle and returning to bilateral stance.  10 reps with improved left knee control.   Standing at counter BUE support side stepping engaging abductors to push off and facilitates weight shift onto foot stepping.  Progressed same motion to turning foot stepping 90* to change walking directions.  Pt performed to right & to left using chair back for support with weight transition.      TREATMENT:   L LE 01/24/2024 Therapeutic Exercise recumbent bike seat 9 level 3 for 8 min.  Gastroc stretch on step heel depression 30 sec and attempting BLE heel raises but strength unable to lift heels even with BUE support //bars Hamstring stretch standing on step with foot DF 30 sec hold 3 reps Seated BLEs planterflexion blue theraband 10 reps 2 sets and PF with inversion & eversion 10 reps 2 sets Standing BLEs alternating blue Theraband figure 8 above knees 10 reps ea abduction, extension & SLR flexion with stance LE controlled knee flexion simulating terminal stance.  PT updated HEP to include above exercises and pt verbalized understanding after performing in clinic.    HOME EXERCISE PROGRAM: Access Code: [email or web address] URL: [email or web address] Date: 01/24/2024 Prepared by: Grayce Spatz  Exercises - Long Sitting Quad Set with Towel Roll Under Heel  - 2 x daily - 7 x weekly - 2 sets - 10 reps - 2 hold - Active Straight Leg Raise with Quad Set  - 2 x daily - 7 x weekly - 2 sets - 10 reps - Supine Heel Slides  - 2 x daily - 7 x weekly - 2 sets - 10 reps - 2 hold - Supine Bridge  - 2 x daily - 7 x weekly - 2 sets - 10 reps - 2 hold - Mini Squat  with Counter Support  - 2 x daily - 7 x weekly - 2 sets - 10 reps - Seated Table Hamstring Stretch  - 1 x daily - 7 x weekly - 1 sets - 3 reps - 30 seconds hold - Supine ITB Stretch with Strap  - 1 x daily - 7 x weekly - 1 sets - 3 reps - 30 seconds hold - Seated Hip Internal Rotation AROM  - 1 x daily - 7 x weekly - 2 sets - 10 reps - 5 seconds hold - Seated Hip External Rotation AROM  - 1 x daily - 7 x weekly - 2 sets - 10 reps - 5 seconds hold - Gastroc Stretch on Step  - 1 x daily - 7 x weekly - 1 sets - 3 reps - 30 seconds hold - Standing Hamstring Stretch with Step  - 1 x daily - 7 x weekly - 1 sets - 3 reps - 30 seconds hold - Seated Ankle Plantar Flexion with Resistance Loop  - 1 x daily - 7 x weekly - 2 sets - 10 reps - 5 seconds hold - Long sitting ankle  plantarflexion with eversion  - 1 x daily - 7 x weekly - 2 sets - 10 reps - 5 seconds hold - Long sitting ankle plantarflexion with eversion  - 1 x daily - 7 x weekly - 2 sets - 10 reps - 5 seconds hold - Standing Hip Abduction with Resistance at Thighs  - 1 x daily - 7 x weekly - 2 sets - 10 reps - 5 seconds hold - Standing Hip Extension with Resistance at Ankles and Counter Support  - 1 x daily - 7 x weekly - 2 sets - 10 reps - 5 seconds hold - Standing Hip Flexion with Anchored Resistance and Chair Support  - 1 x daily - 7 x weekly - 2 sets - 10 reps - 5 seconds hold  ASSESSMENT:  CLINICAL IMPRESSION: PT worked on activities on / off compliant surfaces and moving in angular motions.  Pt improved with PT instruction and repetition but had balance & instability issues needing UE or PT assist.  Pt was very fatigued with these activities by end of PT session.   Patient continues to benefit from skilled PT.   OBJECTIVE IMPAIRMENTS: decreased endurance, decreased ROM, decreased strength, impaired flexibility, and pain.   ACTIVITY LIMITATIONS: squatting, stairs, and transfers  PARTICIPATION LIMITATIONS: community activity, church, and  farming  PERSONAL FACTORS: 1-2 comorbidities: previous fractures, LE neuropathy are also affecting patient's functional outcome.   REHAB POTENTIAL: Good  CLINICAL DECISION MAKING: Stable/uncomplicated  EVALUATION COMPLEXITY: Moderate   GOALS: Goals reviewed with patient? Yes  SHORT TERM GOALS: Target date: 01/10/2024  MET   Pt to be independent with HEP. Baseline: Goal status: MET 01/08/24  2.  Decrease pain by 1-2 levels. Baseline: 7/10 Goal status: MET  01/10/24  LONG TERM GOALS: Target date: 02/21/2024  10weeks  Pt to be independent with self progressive HEP at time of discharge. Baseline:  Goal status: Ongoing       02/05/2024  2.  Pt able to ambulate community distances without AD and no pain. Baseline: SPC Goal status: Ongoing      02/05/2024  3.  Increase L LE strength by 1/2 grade to 1 full grade. Baseline: 4- Goal status: Ongoing      02/05/2024  4.  Increase knee AROM to 0>105 active. Baseline: 5>90 Goal status: Ongoing     02/05/2024  5.  Decrease max pain to 2/10 with all activities. Baseline: 7/10 Goal status: Ongoing     02/05/2024  6.  Increase PSFS score  by at least 2-3 points to demonstrate measurable difference. Baseline: 5.66 Goal status: Ongoing    02/05/2024   PLAN:  PT FREQUENCY: 2x/week  PT DURATION: 10  PLANNED INTERVENTIONS: 02835- PT Re-evaluation, 97110-Therapeutic exercises, 97530- Therapeutic activity, 97112- Neuromuscular re-education, 97535- Self Care, 02859- Manual therapy, [phone]- Gait training, [phone]- Aquatic Therapy, Patient/Family education, Balance training, Stair training, Joint mobilization, Scar mobilization, and Moist heat  PLAN FOR NEXT SESSION:  Continue closed chain strength including multiple planes including on/off compliant surfaces.     Continue with porgressive exercises, mobility and balance exercises.   Ibeth Fahmy, PT, DPT 02/07/2024, 1:44 PM

## 2024-02-09 ENCOUNTER — Ambulatory Visit (HOSPITAL_COMMUNITY)
Admission: RE | Admit: 2024-02-09 | Discharge: 2024-02-09 | Attending: Physician Assistant | Admitting: Physician Assistant

## 2024-02-09 DIAGNOSIS — Z87891 Personal history of nicotine dependence: Secondary | ICD-10-CM

## 2024-02-12 ENCOUNTER — Ambulatory Visit: Admitting: Physical Therapy

## 2024-02-12 ENCOUNTER — Encounter: Payer: Self-pay | Admitting: Physical Therapy

## 2024-02-12 DIAGNOSIS — G8929 Other chronic pain: Secondary | ICD-10-CM

## 2024-02-12 DIAGNOSIS — R2689 Other abnormalities of gait and mobility: Secondary | ICD-10-CM

## 2024-02-12 DIAGNOSIS — R600 Localized edema: Secondary | ICD-10-CM

## 2024-02-12 DIAGNOSIS — M6281 Muscle weakness (generalized): Secondary | ICD-10-CM

## 2024-02-12 DIAGNOSIS — M25662 Stiffness of left knee, not elsewhere classified: Secondary | ICD-10-CM

## 2024-02-12 DIAGNOSIS — R2681 Unsteadiness on feet: Secondary | ICD-10-CM

## 2024-02-12 NOTE — Therapy (Signed)
 OUTPATIENT PHYSICAL THERAPY LOWER EXTREMITY TREATMENT   Patient Name: Jeffrey [name] MRN: [id number] DOB:Jul 10, 1955, 68 y.o., male Today's Date: 02/12/2024  END OF SESSION:  PT End of Session - 02/12/24 1101     Visit Number 13    Number of Visits 21    Date for Recertification  02/21/24    Authorization Type [name]    Authorization Time Period [place] [id number] 9/17-2/5  15 VISITS    Authorization - Visit Number 13    Authorization - Number of Visits 15    PT Start Time 1100    PT Stop Time 1143    PT Time Calculation (min) 43 min    Activity Tolerance Patient tolerated treatment well    Behavior During Therapy WFL for tasks assessed/performed              Past Medical History:  Diagnosis Date   Aneurysm of infrarenal abdominal aorta    CAD (coronary artery disease)    4 stents   Carotid artery disease    Left ICA   Diabetes mellitus without complication (HCC)    Dysrhythmia    A. Fib   Hyperlipidemia    Intracerebral hemorrhage (HCC) 1960   Peripheral neuropathy    Post-traumatic osteoarthritis of left knee    Past Surgical History:  Procedure Laterality Date   APPENDECTOMY  1960   I & D EXTREMITY Right 06/19/2020   Procedure: IRRIGATION AND DEBRIDEMENT LONG FINGER;  Surgeon: Murrell Drivers, MD;  Location: [facility] OR;  Service: Orthopedics;  Laterality: Right;   LEFT HEART CATH  06/20/2018   ORIF FEMUR FRACTURE  1988   ORIF FEMUR FRACTURE Left 10/03/2023   Procedure: OPEN REDUCTION INTERNAL FIXATION (ORIF) DISTAL FEMUR FRACTURE;  Surgeon: Celena Sharper, MD;  Location: [facility] OR;  Service: Orthopedics;  Laterality: Left;   TOTAL KNEE ARTHROPLASTY Left 05/12/2023   Procedure: LEFT TOTAL KNEE ARTHROPLASTY;  Surgeon: Jerri Kay CHRISTELLA, MD;  Location: [facility] OR;  Service: Orthopedics;  Laterality: Left;   VARICOSE VEIN SURGERY Right    right leg   Patient Active Problem List   Diagnosis Date Noted   Periprosthetic fracture around internal prosthetic left knee joint 10/02/2023    Supracondylar fracture of left femur (HCC) 10/02/2023   Closed left femoral fracture (HCC) 10/02/2023   Hypokalemia 10/02/2023   Status post total left knee replacement 05/12/2023   Post-traumatic osteoarthritis of left knee 01/31/2023   Atrial fibrillation (HCC) 11/26/2021   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   Occlusion and stenosis of carotid artery without mention of cerebral infarction 11/19/2013    PCP: Jeffrey [name]  REFERRING PROVIDER: Celena Sharper, MD , Jeffrey Grave PA  REFERRING DIAG: femur 15 visits  Closed fracture of left femur, unspecified fracture morphology, unspecified portion of femur, initial encounter (HCC)  - Primary S72.92XA   THERAPY DIAG:  Chronic pain of left knee  Muscle weakness (generalized)  Stiffness of left knee, not elsewhere classified  Localized edema  Unsteadiness on feet  Other abnormalities of gait and mobility  Rationale for Evaluation and Treatment: Rehabilitation  ONSET DATE: 10/03/23  SUBJECTIVE:   SUBJECTIVE STATEMENT:  He had CAT scan on chest on Friday but not on femur.  He is doing HEP including stretches.    PERTINENT HISTORY: L TKA 05/12/23,  L femur fx after fall 09/30/23 ORIF 10/03/23, this is 3rd L femur fx, CAD, DM, peripheral neuropathy  PAIN:  Are you having pain?   Yes: NPRS scale: since last PT sitting 0-2/10, with  activities 4-5/10  Pain location: left thigh distal - lateral  Pain description: dull, sharp Aggravating factors: walking, standing Relieving factors: rest, pain meds at night  PRECAUTIONS: Other: + L TKA  RED FLAGS: 3rd L femur fracture   WEIGHT BEARING RESTRICTIONS: Yes WBAT  FALLS:  Has patient fallen in last 6 months? Yes. Number of falls 1  LIVING ENVIRONMENT: Lives with: lives with their spouse Lives in: House/apartment Stairs: Yes: External: 4 steps; can reach both Has following equipment at home: Single point cane  OCCUPATION: retired   PLOF: Independent  PATIENT GOALS:  decrease pain  NEXT MD VISIT: unknown  OBJECTIVE:  Note: Objective measures were completed at Evaluation unless otherwise noted.  DIAGNOSTIC FINDINGS: 11/08/23 X-rays demonstrate evidence of consolidation at the fracture site.  No  hardware complication.   PATIENT SURVEYS:  PSFS: THE PATIENT SPECIFIC FUNCTIONAL SCALE  Place score of 0-10 (0 = unable to perform activity and 10 = able to perform activity at the same level as before injury or problem)  Activity Date: 12/13/23 02/07/24   walking 7 8   2.transfers (stand/sit & getting on/off tractor) 8 9   3.stairs  3 without handrails and >3 with handrail 2  5   4.      Total Score 5.6 7.33     Total Score = Sum of activity scores/number of activities  Minimally Detectable Change: 3 points (for single activity); 2 points (for average score)  [facility] (nd). The Patient Specific Functional Scale . Retrieved from [email or web address]   COGNITION: Overall cognitive status: Within functional limits for tasks assessed     SENSATION: + hx neuropathy feet  EDEMA: ---   MUSCLE LENGTH: Hamstrings: Right mod restriction  deg; Left Right mod restriction    POSTURE: flexed trunk   PALPATION: 1+ tenderness at ant knee and at lateral incision  LOWER EXTREMITY ROM: hip PROM [facility]  A/P ROM Left eval  Hip flexion 100  Hip extension   Hip abduction 40  Hip adduction   Hip internal rotation   Hip external rotation   Knee flexion 90/100  Knee extension +5  Ankle dorsiflexion   Ankle plantarflexion   Ankle inversion   Ankle eversion    (Blank rows = not tested)  LOWER EXTREMITY MMT:  MMT Left eval  Hip flexion 4-  Hip extension 4-  Hip abduction 4-  Hip adduction   Hip internal rotation   Hip external rotation   Knee flexion 4  Knee extension 4-  Ankle dorsiflexion   Ankle plantarflexion   Ankle inversion   Ankle eversion    (Blank rows = not  tested)  LOWER EXTREMITY SPECIAL TESTS: none   FUNCTIONAL TESTS:  5 times sit to stand: 15 sec with UE use and inc sway/neuropathy  GAIT: Distance walked: short community Assistive device utilized: Single point cane Level of assistance: Modified independence Comments: ---  TODAY'S TREATMENT:   L LE 02/12/2024 Therapeutic Exercise recumbent bike seat 9 level 4 for 8 min.   Therapeutic Activities: LLE stance knee flexion on outward motions and ext on inward motions. Safe set-up with sink & 2 chair backs.  PT supervision.  5 reps with ea LE leading.   Stepping on, over and down on BOSU with BUE support //bars 5 reps ea LE progressed 5 reps stepping down to midline for slight knee rotation: when LLE on BOSU working on lift & lowering power and when RLE on BOSU working on LLE active knee flexion to clear & loading / landing on LLE with knee stability.   Lateral step up on BOSU with contralateral LE reaching across midline ant with stance knee ext and post with stance knee unlocked / flexion.    Neuromuscular Re-education: Standing in corner with chair back in front for safety.   5 reps each head motion eyes open then eyes closed Standing crossways on rolled towel so heels & toes not touching  Static eyes open with intermittent touch for 30 sec Eyes closed 3 reps 10 sec light touch BUEs, LUE light touch 10 sec, RUE light touch 10 sec; then progressed to lifting fingers until balance loss (1-2 sec) 5 rsp   TREATMENT:   L LE 02/07/2024 Therapeutic Exercise recumbent bike seat 9 level 4 for 8 min.   Therapeutic Activities: Stepping on, over and down on BOSU with BUE support //bars 10 reps ea LE: when LLE on BOSU working on lift & lowering power and when RLE on BOSU working on LLE active knee flexion to clear & loading / landing on LLE with knee stability.   Side  stepping engaging outside leg / push off to facilitate improved weight shift 1st lap RUE support then 3 laps without UE support.  Progressed to using technique to turn 90* to walk away from stationary position.  PT verbal cues on technique including to right & to left.   Side stepping on foam beam including transitioning firm to/from compliant surface with light touch (trying minimize weight bearing on UEs)  3 laps.   Stepping over 6 hurdles (6) with one foot bw each hurdle working on knee flexion to clear and knee ext for stance. 10 reps over 6 hurdles progressing from BUE to RUE to LUE to intermittent UE support.   Stepping towards 4 cones (ant, ant-lat, lat & post-lat) with focus on ankle / knee / hip control rotating upper body over stance limb.  3 reps to each cone leading with LLE & with RLE. Intermittent touch counter & PT minA for balance.     TREATMENT:   L LE 02/05/2024 Therapeutic Exercise [employer] recumbent bike with foot pedal in notch 2 (increased ROM) seat 13 level 1 for 8 min.   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  Pt able to climb 2 rungs with step to pattern.  Terminal stance LLE knee flexion with weight shift over toes.  Progressed to amb in //bars with focus on terminal stance / swing knee flexion and weight acceptance / stance knee ext.   Side stepping engaging outside leg / push off to facilitate improved weight shift.  Progressed to using technique to turn 90* to walk away from stationary position.  PT demo & verbal cues on technique including to right & to left.   Stepping over 6 hurdles (5) with one foot bw  each hurdle working on knee flexion to clear and knee ext for stance. 10 reps over 5 hurdles progressing from BUE to RUE to LUE to intermittent UE support.   PT demo & verbal cues on using bar stool as modified standing (trunk/UEs/eyes) higher than sitting in 18 chair and sit  to/from stand.  Pt able to return demo with 24 bar stool with 3 reps (1 needing minA for stabilization).  Pt able to sit on 29 mat table with pelvis & feet supported and sit/stand safely.  Pt verbalized understanding of function.     TREATMENT:   L LE 01/31/2024 Therapeutic Exercise recumbent bike seat 9 level 1 for 8 min.  Quad stretch supine with strap knee flexion with PT cues on set-up to limit abduction.  30 sec 3 reps.  Pt verbalized understanding.    Self-care: PT verbally instructed patient in components of quad muscle including patella, quad tendon & patella tendon. When his knee buckles this will typically cause a quick stretch to quad muscle and quick high intensity contraction to prevent full buckle & fall.  PT discussed that his knee seems stronger but may be only functioning in straight line weight shifts.  So PT adding some angle movements.  Pt verbalized understanding.  PT reviewed ice massage to help pain response since he can only take Tylenol  which does not help with inflammation much.  Pt able to return demo understanding.    Therapeutic Activities: PT verbally discussed walking with LLE stiff to protect buckling, may increase his quad pain with maintaining longer contraction.  If his quad is inflamed he may benefit from going back onto RW for community activities until it calms down.  Pt verbalized understanding.  LLE stance knee flexion on outward motions and ext on inward motions. LUE on sink & RUE cane. Stepping RLE to 4 corners of rectangle and returning to bilateral stance.  10 reps with improved left knee control.   Standing at counter BUE support side stepping engaging abductors to push off and facilitates weight shift onto foot stepping.  Progressed same motion to turning foot stepping 90* to change walking directions.  Pt performed to right & to left using chair back for support with weight transition.     HOME EXERCISE PROGRAM: Access Code: [email or web address] URL:  [email or web address] Date: 01/24/2024 Prepared by: Grayce Spatz  Exercises - Long Sitting Quad Set with Towel Roll Under Heel  - 2 x daily - 7 x weekly - 2 sets - 10 reps - 2 hold - Active Straight Leg Raise with Quad Set  - 2 x daily - 7 x weekly - 2 sets - 10 reps - Supine Heel Slides  - 2 x daily - 7 x weekly - 2 sets - 10 reps - 2 hold - Supine Bridge  - 2 x daily - 7 x weekly - 2 sets - 10 reps - 2 hold - Mini Squat with Counter Support  - 2 x daily - 7 x weekly - 2 sets - 10 reps - Seated Table Hamstring Stretch  - 1 x daily - 7 x weekly - 1 sets - 3 reps - 30 seconds hold - Supine ITB Stretch with Strap  - 1 x daily - 7 x weekly - 1 sets - 3 reps - 30 seconds hold - Seated Hip Internal Rotation AROM  - 1 x daily - 7 x weekly - 2 sets - 10 reps - 5 seconds hold - Seated Hip External Rotation AROM  -  1 x daily - 7 x weekly - 2 sets - 10 reps - 5 seconds hold - Gastroc Stretch on Step  - 1 x daily - 7 x weekly - 1 sets - 3 reps - 30 seconds hold - Standing Hamstring Stretch with Step  - 1 x daily - 7 x weekly - 1 sets - 3 reps - 30 seconds hold - Seated Ankle Plantar Flexion with Resistance Loop  - 1 x daily - 7 x weekly - 2 sets - 10 reps - 5 seconds hold - Long sitting ankle plantarflexion with eversion  - 1 x daily - 7 x weekly - 2 sets - 10 reps - 5 seconds hold - Long sitting ankle plantarflexion with eversion  - 1 x daily - 7 x weekly - 2 sets - 10 reps - 5 seconds hold - Standing Hip Abduction with Resistance at Thighs  - 1 x daily - 7 x weekly - 2 sets - 10 reps - 5 seconds hold - Standing Hip Extension with Resistance at Ankles and Counter Support  - 1 x daily - 7 x weekly - 2 sets - 10 reps - 5 seconds hold - Standing Hip Flexion with Anchored Resistance and Chair Support  - 1 x daily - 7 x weekly - 2 sets - 10 reps - 5 seconds hold  ASSESSMENT:  CLINICAL IMPRESSION: Patient had improved knee control with activities.  He continues to have significant  balance  issues with high fall risk especially on compliant surfaces or dark situations. Patient continues to benefit from skilled PT.   OBJECTIVE IMPAIRMENTS: decreased endurance, decreased ROM, decreased strength, impaired flexibility, and pain.   ACTIVITY LIMITATIONS: squatting, stairs, and transfers  PARTICIPATION LIMITATIONS: community activity, church, and farming  PERSONAL FACTORS: 1-2 comorbidities: previous fractures, LE neuropathy are also affecting patient's functional outcome.   REHAB POTENTIAL: Good  CLINICAL DECISION MAKING: Stable/uncomplicated  EVALUATION COMPLEXITY: Moderate   GOALS: Goals reviewed with patient? Yes  SHORT TERM GOALS: Target date: 01/10/2024  MET   Pt to be independent with HEP. Baseline: Goal status: MET 01/08/24  2.  Decrease pain by 1-2 levels. Baseline: 7/10 Goal status: MET  01/10/24  LONG TERM GOALS: Target date: 02/21/2024  10weeks  Pt to be independent with self progressive HEP at time of discharge. Baseline:  Goal status: Ongoing       02/12/2024  2.  Pt able to ambulate community distances without AD and no pain. Baseline: SPC Goal status: Ongoing      02/12/2024  3.  Increase L LE strength by 1/2 grade to 1 full grade. Baseline: 4- Goal status: Ongoing      02/12/2024  4.  Increase knee AROM to 0>105 active. Baseline: 5>90 Goal status: Ongoing     02/12/2024  5.  Decrease max pain to 2/10 with all activities. Baseline: 7/10 Goal status: Ongoing     02/12/2024  6.  Increase PSFS score  by at least 2-3 points to demonstrate measurable difference. Baseline: 5.66 Goal status: Ongoing    02/12/2024   PLAN:  PT FREQUENCY: 2x/week  PT DURATION: 10  PLANNED INTERVENTIONS: 02835- PT Re-evaluation, 97110-Therapeutic exercises, 97530- Therapeutic activity, 97112- Neuromuscular re-education, 97535- Self Care, [id number]- Manual therapy, [phone]- Gait training, [phone]- Aquatic Therapy, Patient/Family education, Balance training, Stair training,  Joint mobilization, Scar mobilization, and Moist heat  PLAN FOR NEXT SESSION:   Continue closed chain strength including multiple planes including on/off compliant surfaces.     Continue with porgressive  exercises, mobility and balance exercises.   Grayce Spatz, PT, DPT 02/12/2024, 5:12 PM

## 2024-02-14 ENCOUNTER — Ambulatory Visit: Admitting: Physical Therapy

## 2024-02-14 ENCOUNTER — Encounter: Payer: Self-pay | Admitting: Physical Therapy

## 2024-02-14 DIAGNOSIS — G8929 Other chronic pain: Secondary | ICD-10-CM | POA: Diagnosis not present

## 2024-02-14 DIAGNOSIS — M6281 Muscle weakness (generalized): Secondary | ICD-10-CM | POA: Diagnosis not present

## 2024-02-14 DIAGNOSIS — R600 Localized edema: Secondary | ICD-10-CM

## 2024-02-14 DIAGNOSIS — R2689 Other abnormalities of gait and mobility: Secondary | ICD-10-CM | POA: Diagnosis not present

## 2024-02-14 DIAGNOSIS — M25562 Pain in left knee: Secondary | ICD-10-CM

## 2024-02-14 DIAGNOSIS — M25662 Stiffness of left knee, not elsewhere classified: Secondary | ICD-10-CM

## 2024-02-14 DIAGNOSIS — R2681 Unsteadiness on feet: Secondary | ICD-10-CM

## 2024-02-14 NOTE — Therapy (Signed)
 OUTPATIENT PHYSICAL THERAPY LOWER EXTREMITY TREATMENT   Patient Name: Jeffrey [name] MRN: [id number] DOB:April 16, 1955, 68 y.o., male Today's Date: 02/14/2024  END OF SESSION:  PT End of Session - 02/14/24 0933     Visit Number 14    Number of Visits 21    Date for Recertification  02/21/24    [place]ization Type [facility]    Authorization Time Period [place] [id number] 9/17-2/5  15 VISITS    Authorization - Visit Number 14    Authorization - Number of Visits 15    PT Start Time 0930    PT Stop Time 1013    PT Time Calculation (min) 43 min    Activity Tolerance Patient tolerated treatment well    Behavior During Therapy WFL for tasks assessed/performed          Past Medical History:  Diagnosis Date   Aneurysm of infrarenal abdominal aorta    CAD (coronary artery disease)    4 stents   Carotid artery disease    Left ICA   Diabetes mellitus without complication (HCC)    Dysrhythmia    A. Fib   Hyperlipidemia    Intracerebral hemorrhage (HCC) 1960   Peripheral neuropathy    Post-traumatic osteoarthritis of left knee    Past Surgical History:  Procedure Laterality Date   APPENDECTOMY  1960   I & D EXTREMITY Right 06/19/2020   Procedure: IRRIGATION AND DEBRIDEMENT LONG FINGER;  Surgeon: Murrell Drivers, MD;  Location: [facility] OR;  Service: Orthopedics;  Laterality: Right;   LEFT HEART CATH  06/20/2018   ORIF FEMUR FRACTURE  1988   ORIF FEMUR FRACTURE Left 10/03/2023   Procedure: OPEN REDUCTION INTERNAL FIXATION (ORIF) DISTAL FEMUR FRACTURE;  Surgeon: Celena Sharper, MD;  Location: [facility] OR;  Service: Orthopedics;  Laterality: Left;   TOTAL KNEE ARTHROPLASTY Left 05/12/2023   Procedure: LEFT TOTAL KNEE ARTHROPLASTY;  Surgeon: Jerri Kay CHRISTELLA, MD;  Location: [facility] OR;  Service: Orthopedics;  Laterality: Left;   VARICOSE VEIN SURGERY Right    right leg   Patient Active Problem List   Diagnosis Date Noted   Periprosthetic fracture around internal prosthetic left knee joint 10/02/2023   Supracondylar  fracture of left femur (HCC) 10/02/2023   Closed left femoral fracture (HCC) 10/02/2023   Hypokalemia 10/02/2023   Status post total left knee replacement 05/12/2023   Post-traumatic osteoarthritis of left knee 01/31/2023   Atrial fibrillation (HCC) 11/26/2021   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   Occlusion and stenosis of carotid artery without mention of cerebral infarction 11/19/2013    [facility]  REFERRING PROVIDER: Celena Sharper, MD , Ronal Grave PA  REFERRING DIAG: femur 15 visits  Closed fracture of left femur, unspecified fracture morphology, unspecified portion of femur, initial encounter (HCC)  - Primary S72.92XA   THERAPY DIAG:  Chronic pain of left knee  Muscle weakness (generalized)  Stiffness of left knee, not elsewhere classified  Localized edema  Unsteadiness on feet  Other abnormalities of gait and mobility  Rationale for Evaluation and Treatment: Rehabilitation  ONSET DATE: 10/03/23  SUBJECTIVE:   SUBJECTIVE STATEMENT:   Knee hurt medially from PT new activities with some rotation motions.  Better today.   PERTINENT HISTORY: L TKA 05/12/23,  L femur fx after fall 09/30/23 ORIF 10/03/23, this is 3rd L femur fx, CAD, DM, peripheral neuropathy  PAIN:  Are you having pain?   Yes: NPRS scale: since last PT sitting 0-2/10, with activities 4-5/10  Pain location: left thigh distal -  lateral  Pain description: dull, sharp Aggravating factors: walking, standing Relieving factors: rest, pain meds at night  PRECAUTIONS: Other: + L TKA  RED FLAGS: 3rd L femur fracture   WEIGHT BEARING RESTRICTIONS: Yes WBAT  FALLS:  Has patient fallen in last 6 months? Yes. Number of falls 1  LIVING ENVIRONMENT: Lives with: lives with their spouse Lives in: House/apartment Stairs: Yes: External: 4 steps; can reach both Has following equipment at home: Single point cane  OCCUPATION: retired   PLOF: Independent  PATIENT GOALS: decrease  pain  NEXT MD VISIT: unknown  OBJECTIVE:  Note: Objective measures were completed at Evaluation unless otherwise noted.  DIAGNOSTIC FINDINGS: 11/08/23 X-rays demonstrate evidence of consolidation at the fracture site.  No  hardware complication.   PATIENT SURVEYS:  PSFS: THE PATIENT SPECIFIC FUNCTIONAL SCALE  Place score of 0-10 (0 = unable to perform activity and 10 = able to perform activity at the same level as before injury or problem)  Activity Date: 12/13/23 02/07/24   walking 7 8   2.transfers (stand/sit & getting on/off tractor) 8 9   3.stairs  3 without handrails and >3 with handrail 2  5   4.      Total Score 5.6 7.33     Total Score = Sum of activity scores/number of activities  Minimally Detectable Change: 3 points (for single activity); 2 points (for average score)  [facility] (nd). The Patient Specific Functional Scale . Retrieved from [email or web address]   COGNITION: Overall cognitive status: Within functional limits for tasks assessed     SENSATION: + hx neuropathy feet  EDEMA: ---   MUSCLE LENGTH: Hamstrings: Right mod restriction  deg; Left Right mod restriction    POSTURE: flexed trunk   PALPATION: 1+ tenderness at ant knee and at lateral incision  LOWER EXTREMITY ROM: hip PROM [facility]  A/P ROM Left eval Left 02/14/24  Hip flexion 100   Hip extension    Hip abduction 40   Hip adduction    Hip internal rotation    Hip external rotation    Knee flexion 90/100 Seated A: 110*   Knee extension +5 Standing -3* Seated LAQ -14*  Ankle dorsiflexion    Ankle plantarflexion    Ankle inversion    Ankle eversion     (Blank rows = not tested)  LOWER EXTREMITY MMT:  MMT Left eval  Hip flexion 4-  Hip extension 4-  Hip abduction 4-  Hip adduction   Hip internal rotation   Hip external rotation   Knee flexion 4  Knee extension 4-  Ankle dorsiflexion   Ankle plantarflexion    Ankle inversion   Ankle eversion    (Blank rows = not tested)  LOWER EXTREMITY SPECIAL TESTS: none   FUNCTIONAL TESTS:  5 times sit to stand: 15 sec with UE use and inc sway/neuropathy  GAIT: Distance walked: short community Assistive device utilized: Single point cane Level of assistance: Modified independence Comments: ---  TODAY'S TREATMENT:   L LE 02/14/2024 Therapeutic Exercise recumbent bike seat 9 level 4 for 8 min.   Therapeutic Activities: Stepping on, over and down on BOSU with BUE support //bars 5 reps ea LE progressed 5 reps stepping down to midline for slight knee rotation: when LLE on BOSU working on lift & lowering power and when RLE on BOSU working on LLE active knee flexion to clear & loading / landing on LLE with knee stability.   Lateral step up on BOSU with contralateral LE reaching across midline ant with stance knee ext and post with stance knee unlocked / flexion.   Side stepping with PT demo & cues on technique engaging hips / knees progressing BUEs on //bars to single UE ipsilateral to outer LE.  Progressed to turning 90* from stationary position with ipsilateral UE to no UE support (intermittent touch for balance) Braiding with BUE support //bars with PT cues on technique & knee control.      TREATMENT:   L LE 02/12/2024 Therapeutic Exercise recumbent bike seat 9 level 4 for 8 min.   Therapeutic Activities: LLE stance knee flexion on outward motions and ext on inward motions. Safe set-up with sink & 2 chair backs.  PT supervision.  5 reps with ea LE leading.   Stepping on, over and down on BOSU with BUE support //bars 5 reps ea LE progressed 5 reps stepping down to midline for slight knee rotation: when LLE on BOSU working on lift & lowering power and when RLE on BOSU working on LLE active knee flexion to clear & loading /  landing on LLE with knee stability.   Lateral step up on BOSU with contralateral LE reaching across midline ant with stance knee ext and post with stance knee unlocked / flexion.    Neuromuscular Re-education: Standing in corner with chair back in front for safety.   5 reps each head motion eyes open then eyes closed Standing crossways on rolled towel so heels & toes not touching  Static eyes open with intermittent touch for 30 sec Eyes closed 3 reps 10 sec light touch BUEs, LUE light touch 10 sec, RUE light touch 10 sec; then progressed to lifting fingers until balance loss (1-2 sec) 5 rsp   TREATMENT:   L LE 02/07/2024 Therapeutic Exercise recumbent bike seat 9 level 4 for 8 min.   Therapeutic Activities: Stepping on, over and down on BOSU with BUE support //bars 10 reps ea LE: when LLE on BOSU working on lift & lowering power and when RLE on BOSU working on LLE active knee flexion to clear & loading / landing on LLE with knee stability.   Side stepping engaging outside leg / push off to facilitate improved weight shift 1st lap RUE support then 3 laps without UE support.  Progressed to using technique to turn 90* to walk away from stationary position.  PT verbal cues on technique including to right & to left.   Side stepping on foam beam including transitioning firm to/from compliant surface with light touch (trying minimize weight bearing on UEs)  3 laps.   Stepping over 6 hurdles (6) with one foot bw each hurdle working on knee flexion to clear and knee ext for stance. 10 reps over 6 hurdles progressing from BUE to RUE to LUE to intermittent UE support.   Stepping towards 4 cones (ant, ant-lat, lat & post-lat) with focus on ankle / knee / hip control rotating upper body over stance limb.  3  reps to each cone leading with LLE & with RLE. Intermittent touch counter & PT minA for balance.     TREATMENT:   L LE 02/05/2024 Therapeutic Exercise [employer] recumbent bike with foot pedal in notch 2  (increased ROM) seat 13 level 1 for 8 min.   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  Pt able to climb 2 rungs with step to pattern.  Terminal stance LLE knee flexion with weight shift over toes.  Progressed to amb in //bars with focus on terminal stance / swing knee flexion and weight acceptance / stance knee ext.   Side stepping engaging outside leg / push off to facilitate improved weight shift.  Progressed to using technique to turn 90* to walk away from stationary position.  PT demo & verbal cues on technique including to right & to left.   Stepping over 6 hurdles (5) with one foot bw each hurdle working on knee flexion to clear and knee ext for stance. 10 reps over 5 hurdles progressing from BUE to RUE to LUE to intermittent UE support.   PT demo & verbal cues on using bar stool as modified standing (trunk/UEs/eyes) higher than sitting in 18 chair and sit to/from stand.  Pt able to return demo with 24 bar stool with 3 reps (1 needing minA for stabilization).  Pt able to sit on 29 mat table with pelvis & feet supported and sit/stand safely.  Pt verbalized understanding of function.     HOME EXERCISE PROGRAM: Access Code: [email or web address] URL: [email or web address] Date: 01/24/2024 Prepared by: Grayce Spatz  Exercises - Long Sitting Quad Set with Towel Roll Under Heel  - 2 x daily - 7 x weekly - 2 sets - 10 reps - 2 hold - Active Straight Leg Raise with Quad Set  - 2 x daily - 7 x weekly - 2 sets - 10 reps - Supine Heel Slides  - 2 x daily - 7 x weekly - 2 sets - 10 reps - 2 hold - Supine Bridge  - 2 x daily - 7 x weekly - 2 sets - 10 reps - 2 hold - Mini Squat with Counter Support  - 2 x daily - 7 x weekly - 2 sets - 10 reps - Seated Table Hamstring Stretch  - 1 x daily - 7 x weekly - 1 sets - 3 reps - 30 seconds hold - Supine ITB Stretch with Strap  - 1 x daily - 7 x weekly - 1 sets - 3  reps - 30 seconds hold - Seated Hip Internal Rotation AROM  - 1 x daily - 7 x weekly - 2 sets - 10 reps - 5 seconds hold - Seated Hip External Rotation AROM  - 1 x daily - 7 x weekly - 2 sets - 10 reps - 5 seconds hold - Gastroc Stretch on Step  - 1 x daily - 7 x weekly - 1 sets - 3 reps - 30 seconds hold - Standing Hamstring Stretch with Step  - 1 x daily - 7 x weekly - 1 sets - 3 reps - 30 seconds hold - Seated Ankle Plantar Flexion with Resistance Loop  - 1 x daily - 7 x weekly - 2 sets - 10 reps - 5 seconds hold - Long sitting ankle plantarflexion with eversion  - 1 x daily - 7 x weekly - 2 sets - 10 reps - 5 seconds  hold - Long sitting ankle plantarflexion with eversion  - 1 x daily - 7 x weekly - 2 sets - 10 reps - 5 seconds hold - Standing Hip Abduction with Resistance at Thighs  - 1 x daily - 7 x weekly - 2 sets - 10 reps - 5 seconds hold - Standing Hip Extension with Resistance at Ankles and Counter Support  - 1 x daily - 7 x weekly - 2 sets - 10 reps - 5 seconds hold - Standing Hip Flexion with Anchored Resistance and Chair Support  - 1 x daily - 7 x weekly - 2 sets - 10 reps - 5 seconds hold  ASSESSMENT:  CLINICAL IMPRESSION: Patient continues to be limited by pain. He is slowly improving knee motions with PT activities.  Patient will probably need re-certification next visit along with request for more visits from [place].  Patient continues to benefit from skilled PT.   OBJECTIVE IMPAIRMENTS: decreased endurance, decreased ROM, decreased strength, impaired flexibility, and pain.   ACTIVITY LIMITATIONS: squatting, stairs, and transfers  PARTICIPATION LIMITATIONS: community activity, church, and farming  PERSONAL FACTORS: 1-2 comorbidities: previous fractures, LE neuropathy are also affecting patient's functional outcome.   REHAB POTENTIAL: Good  CLINICAL DECISION MAKING: Stable/uncomplicated  EVALUATION COMPLEXITY: Moderate   GOALS: Goals reviewed with patient? Yes  SHORT  TERM GOALS: Target date: 01/10/2024  MET   Pt to be independent with HEP. Baseline: Goal status: MET 01/08/24  2.  Decrease pain by 1-2 levels. Baseline: 7/10 Goal status: MET  01/10/24  LONG TERM GOALS: Target date: 02/21/2024  10weeks  Pt to be independent with self progressive HEP at time of discharge. Baseline:  Goal status: Ongoing       02/12/2024  2.  Pt able to ambulate community distances without AD and no pain. Baseline: SPC Goal status: Ongoing      02/12/2024  3.  Increase L LE strength by 1/2 grade to 1 full grade. Baseline: 4- Goal status: Ongoing      02/12/2024  4.  Increase knee AROM to 0>105 active. Baseline: 5>90 Goal status: Ongoing     02/12/2024  5.  Decrease max pain to 2/10 with all activities. Baseline: 7/10 Goal status: Ongoing     02/12/2024  6.  Increase PSFS score  by at least 2-3 points to demonstrate measurable difference. Baseline: 5.66 Goal status: Ongoing    02/12/2024   PLAN:  PT FREQUENCY: 2x/week  PT DURATION: 10 weeks  PLANNED INTERVENTIONS: [id number]- PT Re-evaluation, 97110-Therapeutic exercises, 97530- Therapeutic activity, 97112- Neuromuscular re-education, 97535- Self Care, [id number]- Manual therapy, [phone]- Gait training, [phone]- Aquatic Therapy, Patient/Family education, Balance training, Stair training, Joint mobilization, Scar mobilization, and Moist heat  PLAN FOR NEXT SESSION:  check LTGs next visit and do re-certification continuing & [place].     Destyn Parfitt, PT, DPT 02/14/2024, 5:14 PM

## 2024-02-19 ENCOUNTER — Encounter: Payer: Self-pay | Admitting: Physical Therapy

## 2024-02-19 ENCOUNTER — Ambulatory Visit: Admitting: Physical Therapy

## 2024-02-19 DIAGNOSIS — R600 Localized edema: Secondary | ICD-10-CM

## 2024-02-19 DIAGNOSIS — R2689 Other abnormalities of gait and mobility: Secondary | ICD-10-CM

## 2024-02-19 DIAGNOSIS — M25562 Pain in left knee: Secondary | ICD-10-CM

## 2024-02-19 DIAGNOSIS — G8929 Other chronic pain: Secondary | ICD-10-CM

## 2024-02-19 DIAGNOSIS — M25662 Stiffness of left knee, not elsewhere classified: Secondary | ICD-10-CM

## 2024-02-19 DIAGNOSIS — R2681 Unsteadiness on feet: Secondary | ICD-10-CM

## 2024-02-19 DIAGNOSIS — M6281 Muscle weakness (generalized): Secondary | ICD-10-CM

## 2024-02-19 NOTE — Therapy (Signed)
 OUTPATIENT PHYSICAL THERAPY LOWER EXTREMITY TREATMENT & PROGRESS NOTE /  RE-CERTIFICATION   Patient Name: Jeffrey [name] MRN: [id number] DOB:[birth year], 68 y.o., male Today's Date: 02/19/2024  Progress Note Reporting Period 12/13/2023 to 02/19/2024  See note below for Objective Data and Assessment of Progress/Goals.   END OF SESSION:  PT End of Session - 02/19/24 1059     Visit Number 15    Number of Visits 30    Date for Recertification  04/18/24    [place]ization Type [facility]    Authorization Time Period [place] [id number] 9/17-2/5  15 VISITS    Authorization - Visit Number 15    Authorization - Number of Visits 15    PT Start Time 1059    PT Stop Time 1141    PT Time Calculation (min) 42 min    Activity Tolerance Patient tolerated treatment well;Patient limited by pain    Behavior During Therapy WFL for tasks assessed/performed           Past Medical History:  Diagnosis Date   Aneurysm of infrarenal abdominal aorta    CAD (coronary artery disease)    4 stents   Carotid artery disease    Left ICA   Diabetes mellitus without complication (HCC)    Dysrhythmia    A. Fib   Hyperlipidemia    Intracerebral hemorrhage (HCC) 1960   Peripheral neuropathy    Post-traumatic osteoarthritis of left knee    Past Surgical History:  Procedure Laterality Date   APPENDECTOMY  1960   I & D EXTREMITY Right 06/19/2020   Procedure: IRRIGATION AND DEBRIDEMENT LONG FINGER;  Surgeon: Murrell Drivers, MD;  Location: [facility] OR;  Service: Orthopedics;  Laterality: Right;   LEFT HEART CATH  06/20/2018   ORIF FEMUR FRACTURE  1988   ORIF FEMUR FRACTURE Left 10/03/2023   Procedure: OPEN REDUCTION INTERNAL FIXATION (ORIF) DISTAL FEMUR FRACTURE;  Surgeon: Celena Sharper, MD;  Location: [facility] OR;  Service: Orthopedics;  Laterality: Left;   TOTAL KNEE ARTHROPLASTY Left 05/12/2023   Procedure: LEFT TOTAL KNEE ARTHROPLASTY;  Surgeon: Jerri Kay CHRISTELLA, MD;  Location: [facility] OR;  Service: Orthopedics;  Laterality: Left;    VARICOSE VEIN SURGERY Right    right leg   Patient Active Problem List   Diagnosis Date Noted   Periprosthetic fracture around internal prosthetic left knee joint 10/02/2023   Supracondylar fracture of left femur (HCC) 10/02/2023   Closed left femoral fracture (HCC) 10/02/2023   Hypokalemia 10/02/2023   Status post total left knee replacement 05/12/2023   Post-traumatic osteoarthritis of left knee 01/31/2023   Atrial fibrillation (HCC) 11/26/2021   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   Occlusion and stenosis of carotid artery without mention of cerebral infarction 11/19/2013    [facility]  REFERRING PROVIDER: Celena Sharper, MD , Ronal Grave PA  REFERRING DIAG: femur 15 visits  Closed fracture of left femur, unspecified fracture morphology, unspecified portion of femur, initial encounter (HCC)  - Primary S72.92XA   THERAPY DIAG:  Chronic pain of left knee  Muscle weakness (generalized)  Stiffness of left knee, not elsewhere classified  Localized edema  Unsteadiness on feet  Other abnormalities of gait and mobility  Rationale for Evaluation and Treatment: Rehabilitation  ONSET DATE: 10/03/23  SUBJECTIVE:   SUBJECTIVE STATEMENT:  The knee still feels wobbly at times. His knee hurts medially proximally & distal to knee.  He feels that PT is helping but he needs more.    PERTINENT HISTORY: L TKA 05/12/23,  L femur  fx after fall 09/30/23 ORIF 10/03/23, this is 3rd L femur fx, CAD, DM, peripheral neuropathy  PAIN:  Are you having pain?   Yes: NPRS scale: since last PT sitting 0-2/10, with activities up to 4-5/10  Pain location: left thigh distal - lateral  Pain description: dull, sharp Aggravating factors: walking, standing Relieving factors: rest, pain meds at night  PRECAUTIONS: Other: + L TKA  RED FLAGS: 3rd L femur fracture   WEIGHT BEARING RESTRICTIONS: Yes WBAT  FALLS:  Has patient fallen in last 6 months? Yes. Number of falls 1  LIVING  ENVIRONMENT: Lives with: lives with their spouse Lives in: House/apartment Stairs: Yes: External: 4 steps; can reach both Has following equipment at home: Single point cane  OCCUPATION: retired   PLOF: Independent  PATIENT GOALS: decrease pain  NEXT MD VISIT: unknown  OBJECTIVE:  Note: Objective measures were completed at Evaluation unless otherwise noted.  DIAGNOSTIC FINDINGS: 11/08/23 X-rays demonstrate evidence of consolidation at the fracture site.  No  hardware complication.   PATIENT SURVEYS:  PSFS: THE PATIENT SPECIFIC FUNCTIONAL SCALE  Place score of 0-10 (0 = unable to perform activity and 10 = able to perform activity at the same level as before injury or problem)  Activity Date: 12/13/23 02/07/24   walking 7 8   2.transfers (stand/sit & getting on/off tractor) 8 9   3.stairs  3 without handrails and >3 with handrail 2  5   4.      Total Score 5.6 7.33     Total Score = Sum of activity scores/number of activities  Minimally Detectable Change: 3 points (for single activity); 2 points (for average score)  [facility] (nd). The Patient Specific Functional Scale . Retrieved from [email or web address]   COGNITION: Overall cognitive status: Within functional limits for tasks assessed     SENSATION: + hx neuropathy feet  EDEMA: ---  MUSCLE LENGTH: Hamstrings: Right mod restriction; Left Right mod restriction    POSTURE: flexed trunk   PALPATION: 1+ tenderness at ant knee and at lateral incision  LOWER EXTREMITY ROM: hip PROM [facility]  A/P ROM Left eval Left 02/14/24  Hip flexion 100   Hip extension    Hip abduction 40   Hip adduction    Hip internal rotation    Hip external rotation    Knee flexion 90/100 Seated A: 110*   Knee extension +5 Standing -3* Seated LAQ -14*  Ankle dorsiflexion    Ankle plantarflexion    Ankle inversion    Ankle eversion     (Blank rows = not  tested)  LOWER EXTREMITY MMT:  MMT Left eval Right 02/19/24 Left 02/19/24  Hip flexion 4-  5/5  Hip extension 4-  4/5  Hip abduction 4-  4/5  Hip adduction   4/5  Hip internal rotation   4-/5  Hip external rotation   4/5  Knee flexion 4  4/5  Knee extension 4- HH dynameter 79.2#, 80.6# HH dynameter 46.6#. 43.8#  Ankle dorsiflexion  3-/5 3+/5  Ankle plantarflexion  2+/5 Unable to lift heels in standing 2+/5 Unable to lift heels in standing  Ankle inversion     Ankle eversion      (Blank rows = not tested)   FUNCTIONAL TESTS:  02/19/2024: 5x STS from 18 chair with armrests 34.03 with balance issues stabilizing upon arising  Evaluation on 12/13/2023: 5 times sit to stand: 15 sec with UE use and inc sway/neuropathy  GAIT: 02/19/2024:   Gait Velocity: no  AD self-selected 2.22 ft/sec and fast pace 2.70 ft/sec Pt has antalgic pattern with LLE decreased stance duration and abducted.  Pt negotiates ramp without device with slow cautious movements with increased deviations above.  Pt negotiates curb without device requiring modified technique (up with good & down with bad) due to LLE strength.   Pt neg stairs: descending with single rail modified technique modified independent; using LLE or alternating pattern with 2 rails only with supervision.  Ascending with right rail alternating pattern but notable decreased strength in LLE with inability to reach full knee & hip extension.     Evaluation on 12/13/2023: Distance walked: short community Assistive device utilized: Single point cane Level of assistance: Modified independence Comments: ---                                                                                                                                TODAY'S TREATMENT:   L LE 02/19/2024 Therapeutic Exercise recumbent bike seat 9 level 4 for 8 min. Warm-up while PT & pt did subjective.   See MMT in objective data  Physical Performance Testing: See  objective data for 5x Sit to Stand test.   Gait Training: Gait Velocity: no AD self-selected 2.22 ft/sec and fast pace 2.70 ft/sec Pt has antalgic pattern with LLE decreased stance duration and abducted.  Pt negotiates ramp without device with slow cautious movements with increased deviations above.  Pt negotiates curb without device requiring modified technique (up with good & down with bad) due to LLE strength.   Pt neg stairs: descending with single rail modified technique modified independent; using LLE or alternating pattern with 2 rails only with supervision.  Ascending with right rail alternating pattern but notable decreased strength in LLE with inability to reach full knee & hip extension.       TREATMENT:   L LE 02/14/2024 Therapeutic Exercise recumbent bike seat 9 level 4 for 8 min.   Therapeutic Activities: Stepping on, over and down on BOSU with BUE support //bars 5 reps ea LE progressed 5 reps stepping down to midline for slight knee rotation: when LLE on BOSU working on lift & lowering power and when RLE on BOSU working on LLE active knee flexion to clear & loading / landing on LLE with knee stability.   Lateral step up on BOSU with contralateral LE reaching across midline ant with stance knee ext and post with stance knee unlocked / flexion.   Side stepping with PT demo & cues on technique engaging hips / knees progressing BUEs on //bars to single UE ipsilateral to outer LE.  Progressed to turning 90* from stationary position with ipsilateral UE to no UE support (intermittent touch for balance) Braiding with BUE support //bars with PT cues on technique & knee control.      TREATMENT:   L LE 02/12/2024 Therapeutic Exercise recumbent bike seat 9 level 4 for 8 min.   Therapeutic Activities: LLE  stance knee flexion on outward motions and ext on inward motions. Safe set-up with sink & 2 chair backs.  PT supervision.  5 reps with ea LE leading.   Stepping on, over and down  on BOSU with BUE support //bars 5 reps ea LE progressed 5 reps stepping down to midline for slight knee rotation: when LLE on BOSU working on lift & lowering power and when RLE on BOSU working on LLE active knee flexion to clear & loading / landing on LLE with knee stability.   Lateral step up on BOSU with contralateral LE reaching across midline ant with stance knee ext and post with stance knee unlocked / flexion.    Neuromuscular Re-education: Standing in corner with chair back in front for safety.   5 reps each head motion eyes open then eyes closed Standing crossways on rolled towel so heels & toes not touching  Static eyes open with intermittent touch for 30 sec Eyes closed 3 reps 10 sec light touch BUEs, LUE light touch 10 sec, RUE light touch 10 sec; then progressed to lifting fingers until balance loss (1-2 sec) 5 rsp   TREATMENT:   L LE 02/07/2024 Therapeutic Exercise recumbent bike seat 9 level 4 for 8 min.   Therapeutic Activities: Stepping on, over and down on BOSU with BUE support //bars 10 reps ea LE: when LLE on BOSU working on lift & lowering power and when RLE on BOSU working on LLE active knee flexion to clear & loading / landing on LLE with knee stability.   Side stepping engaging outside leg / push off to facilitate improved weight shift 1st lap RUE support then 3 laps without UE support.  Progressed to using technique to turn 90* to walk away from stationary position.  PT verbal cues on technique including to right & to left.   Side stepping on foam beam including transitioning firm to/from compliant surface with light touch (trying minimize weight bearing on UEs)  3 laps.   Stepping over 6 hurdles (6) with one foot bw each hurdle working on knee flexion to clear and knee ext for stance. 10 reps over 6 hurdles progressing from BUE to RUE to LUE to intermittent UE support.   Stepping towards 4 cones (ant, ant-lat, lat & post-lat) with focus on ankle / knee / hip control  rotating upper body over stance limb.  3 reps to each cone leading with LLE & with RLE. Intermittent touch counter & PT minA for balance.      HOME EXERCISE PROGRAM: Access Code: [email or web address] URL: [email or web address] Date: 01/24/2024 Prepared by: Grayce Spatz  Exercises - Long Sitting Quad Set with Towel Roll Under Heel  - 2 x daily - 7 x weekly - 2 sets - 10 reps - 2 hold - Active Straight Leg Raise with Quad Set  - 2 x daily - 7 x weekly - 2 sets - 10 reps - Supine Heel Slides  - 2 x daily - 7 x weekly - 2 sets - 10 reps - 2 hold - Supine Bridge  - 2 x daily - 7 x weekly - 2 sets - 10 reps - 2 hold - Mini Squat with Counter Support  - 2 x daily - 7 x weekly - 2 sets - 10 reps - Seated Table Hamstring Stretch  - 1 x daily - 7 x weekly - 1 sets - 3 reps - 30 seconds hold - Supine ITB Stretch with Strap  - 1 x daily -  7 x weekly - 1 sets - 3 reps - 30 seconds hold - Seated Hip Internal Rotation AROM  - 1 x daily - 7 x weekly - 2 sets - 10 reps - 5 seconds hold - Seated Hip External Rotation AROM  - 1 x daily - 7 x weekly - 2 sets - 10 reps - 5 seconds hold - Gastroc Stretch on Step  - 1 x daily - 7 x weekly - 1 sets - 3 reps - 30 seconds hold - Standing Hamstring Stretch with Step  - 1 x daily - 7 x weekly - 1 sets - 3 reps - 30 seconds hold - Seated Ankle Plantar Flexion with Resistance Loop  - 1 x daily - 7 x weekly - 2 sets - 10 reps - 5 seconds hold - Long sitting ankle plantarflexion with eversion  - 1 x daily - 7 x weekly - 2 sets - 10 reps - 5 seconds hold - Long sitting ankle plantarflexion with eversion  - 1 x daily - 7 x weekly - 2 sets - 10 reps - 5 seconds hold - Standing Hip Abduction with Resistance at Thighs  - 1 x daily - 7 x weekly - 2 sets - 10 reps - 5 seconds hold - Standing Hip Extension with Resistance at Ankles and Counter Support  - 1 x daily - 7 x weekly - 2 sets - 10 reps - 5 seconds hold - Standing Hip Flexion with Anchored Resistance and Chair  Support  - 1 x daily - 7 x weekly - 2 sets - 10 reps - 5 seconds hold  ASSESSMENT:  CLINICAL IMPRESSION: Patient is slowly improving with strength and function but continues to have weakness limiting his mobility.  He needs further PT to maximize functional outcomes and safety.    OBJECTIVE IMPAIRMENTS: decreased endurance, decreased ROM, decreased strength, impaired flexibility, and pain.   ACTIVITY LIMITATIONS: squatting, stairs, and transfers  PARTICIPATION LIMITATIONS: community activity, church, and farming  PERSONAL FACTORS: 1-2 comorbidities: previous fractures, LE neuropathy are also affecting patient's functional outcome.   REHAB POTENTIAL: Good  CLINICAL DECISION MAKING: Stable/uncomplicated  EVALUATION COMPLEXITY: Moderate   GOALS: Goals reviewed with patient? Yes  SHORT TERM GOALS: Target date: 03/15/2023   Pt to be independent with updated HEP. Baseline: Goal status:  ongoing 02/19/2024  2.  Pt reports pain <4/10 with activities. Baseline: 7/10 Goal status: ongoing 02/19/2024  LONG TERM GOALS: Target date: 02/21/2024  10weeks  Pt to be independent with self progressive HEP at time of discharge. Current level: Can understands HEP to date but PT continues to progress with patient changes. Goal status: MET to date    02/19/2024  2.  Pt able to ambulate community distances without AD and no pain. Current level: Patient ambulates community distances without an assistive device but pain increases and he has significant antalgic gait pattern. Goal status: Ongoing     02/19/2024  3.  Increase L LE strength by 1/2 grade to 1 full grade. Baseline: See objective data Goal status: Partially met 02/19/2024  4.  Increase knee AROM to 0>105 active. Baseline:  see objective data Goal status: Partially met 02/19/2024  5.  Decrease max pain to 2/10 with all activities. Baseline: See subjective data Goal status: Not met 02/19/2024  6.  Increase PSFS score  by at least  2-3 points to demonstrate measurable difference. Baseline: 5.66 to 7.3 Goal status: Progressing but not met 02/19/2024  UPDATED LONG TERM GOALS: Target date:  04/19/2023  10weeks  Pt to be independent with ongoing HEP at time of discharge. Baseline:  Goal status: Ongoing     02/19/2024  2.  Pt able to ambulate community distances without AD and no pain. Baseline: See objective data Goal status: Ongoing     02/19/2024  3.  Left LE strength hip and knee 5/5.   Baseline: See objective data Goal status: Ongoing      02/19/2024  4.  Increase knee  standing AROM 0 extension & 105 flexion. Baseline: See objective data Goal status: Ongoing    02/19/2024  5.  Decrease max pain to 2/10 with all activities. Baseline: See subjective data Goal status: Ongoing    02/19/2024  6.  Increase PSFS score  by at least 2-3 points to demonstrate measurable difference. Baseline: See objective data Goal status: Ongoing    02/19/2024   PLAN:  PT FREQUENCY: 1-2x/week  PT DURATION: 15 additional visits over 10 weeks  PLANNED INTERVENTIONS: [id number]- PT Re-evaluation, 97110-Therapeutic exercises, 97530- Therapeutic activity, 97112- Neuromuscular re-education, 97535- Self Care, [id number]- Manual therapy, [phone]- Gait training, [phone]- Aquatic Therapy, Patient/Family education, Balance training, Stair training, Joint mobilization, Scar mobilization, and Moist heat  PLAN FOR NEXT SESSION:   check [facility] [place].  Continue to work on functional strength and pain management.   Grayce Spatz, PT, DPT 02/19/2024, 3:44 PM

## 2024-02-21 ENCOUNTER — Encounter: Admitting: Physical Therapy

## 2024-02-21 NOTE — Therapy (Incomplete)
 OUTPATIENT PHYSICAL THERAPY LOWER EXTREMITY TREATMENT   Patient Name: Jeffrey [name] MRN: [id number] DOB:1955/03/13, 68 y.o., male Today's Date: 02/21/2024  END OF SESSION:     Past Medical History:  Diagnosis Date   Aneurysm of infrarenal abdominal aorta    CAD (coronary artery disease)    4 stents   Carotid artery disease    Left ICA   Diabetes mellitus without complication (HCC)    Dysrhythmia    A. Fib   Hyperlipidemia    Intracerebral hemorrhage (HCC) 1960   Peripheral neuropathy    Post-traumatic osteoarthritis of left knee    Past Surgical History:  Procedure Laterality Date   APPENDECTOMY  1960   I & D EXTREMITY Right 06/19/2020   Procedure: IRRIGATION AND DEBRIDEMENT LONG FINGER;  Surgeon: Murrell Drivers, MD;  Location: [facility] OR;  Service: Orthopedics;  Laterality: Right;   LEFT HEART CATH  06/20/2018   ORIF FEMUR FRACTURE  1988   ORIF FEMUR FRACTURE Left 10/03/2023   Procedure: OPEN REDUCTION INTERNAL FIXATION (ORIF) DISTAL FEMUR FRACTURE;  Surgeon: Celena Sharper, MD;  Location: [facility] OR;  Service: Orthopedics;  Laterality: Left;   TOTAL KNEE ARTHROPLASTY Left 05/12/2023   Procedure: LEFT TOTAL KNEE ARTHROPLASTY;  Surgeon: Jerri Kay CHRISTELLA, MD;  Location: [facility] OR;  Service: Orthopedics;  Laterality: Left;   VARICOSE VEIN SURGERY Right    right leg   Patient Active Problem List   Diagnosis Date Noted   Periprosthetic fracture around internal prosthetic left knee joint 10/02/2023   Supracondylar fracture of left femur (HCC) 10/02/2023   Closed left femoral fracture (HCC) 10/02/2023   Hypokalemia 10/02/2023   Status post total left knee replacement 05/12/2023   Post-traumatic osteoarthritis of left knee 01/31/2023   Atrial fibrillation (HCC) 11/26/2021   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  Rationale for Evaluation and Treatment: Rehabilitation  ONSET DATE: 10/03/23  SUBJECTIVE:   SUBJECTIVE STATEMENT:  *** The knee still feels wobbly at times. His knee hurts medially proximally & distal to knee.  He feels that PT is helping but he needs more.    PERTINENT HISTORY: L TKA 05/12/23,  L femur fx after fall 09/30/23 ORIF 10/03/23, this is 3rd L femur fx, CAD, DM, peripheral neuropathy  PAIN:  Are you having pain?   Yes: NPRS scale: since last PT sitting *** 0-2/10, with activities up to 4-5/10  Pain location: left thigh distal - lateral  Pain description: dull, sharp Aggravating factors: walking, standing Relieving factors: rest, pain meds at night  PRECAUTIONS: Other: + L TKA  RED FLAGS: 3rd L femur fracture   WEIGHT BEARING RESTRICTIONS: Yes WBAT  FALLS:  Has patient fallen in last 6 months? Yes. Number of falls 1  LIVING ENVIRONMENT: Lives with: lives with their spouse Lives in: House/apartment Stairs: Yes: External: 4 steps; can reach both Has following equipment at home: Single point cane  OCCUPATION: retired   PLOF: Independent  PATIENT GOALS: decrease pain  NEXT MD VISIT: unknown  OBJECTIVE:  Note: Objective measures were completed at Evaluation unless otherwise noted.  DIAGNOSTIC FINDINGS: 11/08/23 X-rays demonstrate evidence of consolidation at the fracture site.  No  hardware complication.   PATIENT SURVEYS:  PSFS: THE PATIENT SPECIFIC FUNCTIONAL SCALE  Place score of 0-10 (0 = unable to perform activity and 10 = able to perform activity at the same level as before injury or problem)  Activity Date: 12/13/23 02/07/24   walking 7 8   2.transfers (stand/sit & getting on/off tractor) 8 9   3.stairs  3 without handrails and >3 with  handrail 2  5   4.      Total Score 5.6 7.33     Total Score = Sum of activity scores/number of activities  Minimally Detectable Change: 3 points (for single activity); 2 points (for average score)  [facility] (nd). The Patient Specific Functional Scale . Retrieved from [email or web address]   COGNITION: Overall cognitive status: Within functional limits for tasks assessed     SENSATION: + hx neuropathy feet  EDEMA: ---  MUSCLE LENGTH: Hamstrings: Right mod restriction; Left Right mod restriction    POSTURE: flexed trunk   PALPATION: 1+ tenderness at ant knee and at lateral incision  LOWER EXTREMITY ROM: hip PROM [facility]  A/P ROM Left eval Left 02/14/24  Hip flexion 100   Hip extension    Hip abduction 40   Hip adduction    Hip internal rotation    Hip external rotation    Knee flexion 90/100 Seated A: 110*   Knee extension +5 Standing -3* Seated LAQ -14*  Ankle dorsiflexion    Ankle plantarflexion    Ankle inversion    Ankle eversion     (Blank rows = not tested)  LOWER EXTREMITY MMT:  MMT Left eval Right 02/19/24 Left 02/19/24  Hip flexion 4-  5/5  Hip extension 4-  4/5  Hip abduction 4-  4/5  Hip adduction   4/5  Hip internal rotation   4-/5  Hip external rotation   4/5  Knee flexion 4  4/5  Knee extension 4- HH dynameter 79.2#, 80.6# HH dynameter 46.6#. 43.8#  Ankle dorsiflexion  3-/5 3+/5  Ankle plantarflexion  2+/5 Unable to lift heels in standing 2+/5 Unable to lift heels in standing  Ankle inversion     Ankle eversion      (Blank rows = not tested)   FUNCTIONAL TESTS:  02/19/2024: 5x STS from 18 chair with armrests 34.03 with balance issues stabilizing upon arising  Evaluation on 12/13/2023: 5 times sit to stand: 15 sec with UE use and inc sway/neuropathy  GAIT: 02/19/2024:   Gait Velocity: no AD self-selected 2.22 ft/sec and fast pace 2.70 ft/sec Pt has antalgic  pattern with LLE decreased stance duration and abducted.  Pt negotiates ramp without device with slow cautious movements with increased deviations above.  Pt negotiates curb without device requiring modified technique (up with good & down with bad) due to LLE strength.   Pt neg stairs: descending with single rail modified technique modified independent; using LLE or alternating pattern with 2 rails only with supervision.  Ascending with right rail alternating pattern but notable decreased strength in LLE with inability to reach full knee & hip extension.     Evaluation on 12/13/2023: Distance walked: short community Assistive device utilized: Single point cane Level of assistance: Modified independence Comments: ---  TODAY'S TREATMENT:   L LE 02/21/2024 Therapeutic Exercise *** recumbent bike seat 9 level 4 for 8 min. Warm-up while PT & pt did subjective.    Therapeutic Activities: Stepping on, over and down on BOSU with BUE support //bars 5 reps ea LE progressed 5 reps stepping down to midline for slight knee rotation: when LLE on BOSU working on lift & lowering power and when RLE on BOSU working on LLE active knee flexion to clear & loading / landing on LLE with knee stability.   Lateral step up on BOSU with contralateral LE reaching across midline ant with stance knee ext and post with stance knee unlocked / flexion.   Side stepping with PT demo & cues on technique engaging hips / knees progressing BUEs on //bars to single UE ipsilateral to outer LE.  Progressed to turning 90* from stationary position with ipsilateral UE to no UE support (intermittent touch for balance) Braiding with BUE support //bars with PT cues on technique & knee control.     TREATMENT:   L LE 02/19/2024 Therapeutic Exercise recumbent bike seat 9 level 4 for 8 min. Warm-up while PT & pt  did subjective.   See MMT in objective data  Physical Performance Testing: See objective data for 5x Sit to Stand test.   Gait Training: Gait Velocity: no AD self-selected 2.22 ft/sec and fast pace 2.70 ft/sec Pt has antalgic pattern with LLE decreased stance duration and abducted.  Pt negotiates ramp without device with slow cautious movements with increased deviations above.  Pt negotiates curb without device requiring modified technique (up with good & down with bad) due to LLE strength.   Pt neg stairs: descending with single rail modified technique modified independent; using LLE or alternating pattern with 2 rails only with supervision.  Ascending with right rail alternating pattern but notable decreased strength in LLE with inability to reach full knee & hip extension.       TREATMENT:   L LE 02/14/2024 Therapeutic Exercise recumbent bike seat 9 level 4 for 8 min.   Therapeutic Activities: Stepping on, over and down on BOSU with BUE support //bars 5 reps ea LE progressed 5 reps stepping down to midline for slight knee rotation: when LLE on BOSU working on lift & lowering power and when RLE on BOSU working on LLE active knee flexion to clear & loading / landing on LLE with knee stability.   Lateral step up on BOSU with contralateral LE reaching across midline ant with stance knee ext and post with stance knee unlocked / flexion.   Side stepping with PT demo & cues on technique engaging hips / knees progressing BUEs on //bars to single UE ipsilateral to outer LE.  Progressed to turning 90* from stationary position with ipsilateral UE to no UE support (intermittent touch for balance) Braiding with BUE support //bars with PT cues on technique & knee control.      TREATMENT:   L LE 02/12/2024 Therapeutic Exercise recumbent bike seat 9 level 4 for 8 min.   Therapeutic Activities: LLE stance knee flexion on outward motions and ext on inward motions. Safe set-up with sink & 2 chair  backs.  PT supervision.  5 reps with ea LE leading.   Stepping on, over and down on BOSU with BUE support //bars 5 reps ea LE progressed 5 reps stepping down to midline for slight knee rotation: when LLE on BOSU working on lift & lowering power and when RLE on BOSU working on  LLE active knee flexion to clear & loading / landing on LLE with knee stability.   Lateral step up on BOSU with contralateral LE reaching across midline ant with stance knee ext and post with stance knee unlocked / flexion.    Neuromuscular Re-education: Standing in corner with chair back in front for safety.   5 reps each head motion eyes open then eyes closed Standing crossways on rolled towel so heels & toes not touching  Static eyes open with intermittent touch for 30 sec Eyes closed 3 reps 10 sec light touch BUEs, LUE light touch 10 sec, RUE light touch 10 sec; then progressed to lifting fingers until balance loss (1-2 sec) 5 rsp   HOME EXERCISE PROGRAM: Access Code: [email or web address] URL: [email or web address] Date: 01/24/2024 Prepared by: Grayce Spatz  Exercises - Long Sitting Quad Set with Towel Roll Under Heel  - 2 x daily - 7 x weekly - 2 sets - 10 reps - 2 hold - Active Straight Leg Raise with Quad Set  - 2 x daily - 7 x weekly - 2 sets - 10 reps - Supine Heel Slides  - 2 x daily - 7 x weekly - 2 sets - 10 reps - 2 hold - Supine Bridge  - 2 x daily - 7 x weekly - 2 sets - 10 reps - 2 hold - Mini Squat with Counter Support  - 2 x daily - 7 x weekly - 2 sets - 10 reps - Seated Table Hamstring Stretch  - 1 x daily - 7 x weekly - 1 sets - 3 reps - 30 seconds hold - Supine ITB Stretch with Strap  - 1 x daily - 7 x weekly - 1 sets - 3 reps - 30 seconds hold - Seated Hip Internal Rotation AROM  - 1 x daily - 7 x weekly - 2 sets - 10 reps - 5 seconds hold - Seated Hip External Rotation AROM  - 1 x daily - 7 x weekly - 2 sets - 10 reps - 5 seconds hold - Gastroc Stretch on Step  - 1 x daily - 7 x weekly - 1  sets - 3 reps - 30 seconds hold - Standing Hamstring Stretch with Step  - 1 x daily - 7 x weekly - 1 sets - 3 reps - 30 seconds hold - Seated Ankle Plantar Flexion with Resistance Loop  - 1 x daily - 7 x weekly - 2 sets - 10 reps - 5 seconds hold - Long sitting ankle plantarflexion with eversion  - 1 x daily - 7 x weekly - 2 sets - 10 reps - 5 seconds hold - Long sitting ankle plantarflexion with eversion  - 1 x daily - 7 x weekly - 2 sets - 10 reps - 5 seconds hold - Standing Hip Abduction with Resistance at Thighs  - 1 x daily - 7 x weekly - 2 sets - 10 reps - 5 seconds hold - Standing Hip Extension with Resistance at Ankles and Counter Support  - 1 x daily - 7 x weekly - 2 sets - 10 reps - 5 seconds hold - Standing Hip Flexion with Anchored Resistance and Chair Support  - 1 x daily - 7 x weekly - 2 sets - 10 reps - 5 seconds hold  ASSESSMENT:  CLINICAL IMPRESSION: ***  Patient is slowly improving with strength and function but continues to have weakness limiting his mobility.  He needs further PT to maximize functional outcomes and  safety.    OBJECTIVE IMPAIRMENTS: decreased endurance, decreased ROM, decreased strength, impaired flexibility, and pain.   ACTIVITY LIMITATIONS: squatting, stairs, and transfers  PARTICIPATION LIMITATIONS: community activity, church, and farming  PERSONAL FACTORS: 1-2 comorbidities: previous fractures, LE neuropathy are also affecting patient's functional outcome.   REHAB POTENTIAL: Good  CLINICAL DECISION MAKING: Stable/uncomplicated  EVALUATION COMPLEXITY: Moderate   GOALS: Goals reviewed with patient? Yes  SHORT TERM GOALS: Target date: 03/15/2023   Pt to be independent with updated HEP. Baseline: Goal status:  ongoing 02/19/2024  2.  Pt reports pain <4/10 with activities. Baseline: 7/10 Goal status: ongoing 02/19/2024  LONG TERM GOALS: Target date: 02/21/2024  10weeks  Pt to be independent with self progressive HEP at time of  discharge. Current level: Can understands HEP to date but PT continues to progress with patient changes. Goal status: MET to date    02/19/2024  2.  Pt able to ambulate community distances without AD and no pain. Current level: Patient ambulates community distances without an assistive device but pain increases and he has significant antalgic gait pattern. Goal status: Ongoing     02/19/2024  3.  Increase L LE strength by 1/2 grade to 1 full grade. Baseline: See objective data Goal status: Partially met 02/19/2024  4.  Increase knee AROM to 0>105 active. Baseline:  see objective data Goal status: Partially met 02/19/2024  5.  Decrease max pain to 2/10 with all activities. Baseline: See subjective data Goal status: Not met 02/19/2024  6.  Increase PSFS score  by at least 2-3 points to demonstrate measurable difference. Baseline: 5.66 to 7.3 Goal status: Progressing but not met 02/19/2024  UPDATED LONG TERM GOALS: Target date: 04/19/2023  10weeks  Pt to be independent with ongoing HEP at time of discharge. Baseline:  Goal status: Ongoing     02/19/2024  2.  Pt able to ambulate community distances without AD and no pain. Baseline: See objective data Goal status: Ongoing     02/19/2024  3.  Left LE strength hip and knee 5/5.   Baseline: See objective data Goal status: Ongoing      02/19/2024  4.  Increase knee  standing AROM 0 extension & 105 flexion. Baseline: See objective data Goal status: Ongoing    02/19/2024  5.  Decrease max pain to 2/10 with all activities. Baseline: See subjective data Goal status: Ongoing    02/19/2024  6.  Increase PSFS score  by at least 2-3 points to demonstrate measurable difference. Baseline: See objective data Goal status: Ongoing    02/19/2024   PLAN:  PT FREQUENCY: 1-2x/week  PT DURATION: 15 additional visits over 10 weeks  PLANNED INTERVENTIONS: [id number]- PT Re-evaluation, 97110-Therapeutic exercises, 97530- Therapeutic activity,  97112- Neuromuscular re-education, 97535- Self Care, [id number]- Manual therapy, [id number]- Gait training, [phone]- Aquatic Therapy, Patient/Family education, Balance training, Stair training, Joint mobilization, Scar mobilization, and Moist heat  PLAN FOR NEXT SESSION:   *** check [facility] authorization.  Continue to work on functional strength and pain management.   Grayce Spatz, PT, DPT 02/21/2024, 7:27 AM

## 2024-02-27 ENCOUNTER — Encounter: Admitting: Physical Therapy

## 2024-03-05 ENCOUNTER — Encounter: Admitting: Physical Therapy

## 2024-03-11 ENCOUNTER — Encounter: Admitting: Physical Therapy

## 2024-03-11 DIAGNOSIS — Z0189 Encounter for other specified special examinations: Secondary | ICD-10-CM

## 2024-03-13 ENCOUNTER — Encounter: Payer: Self-pay | Admitting: Physical Therapy

## 2024-03-13 ENCOUNTER — Ambulatory Visit: Admitting: Physical Therapy

## 2024-03-13 DIAGNOSIS — R2689 Other abnormalities of gait and mobility: Secondary | ICD-10-CM | POA: Diagnosis not present

## 2024-03-13 DIAGNOSIS — R2681 Unsteadiness on feet: Secondary | ICD-10-CM | POA: Diagnosis not present

## 2024-03-13 DIAGNOSIS — G8929 Other chronic pain: Secondary | ICD-10-CM

## 2024-03-13 DIAGNOSIS — M25662 Stiffness of left knee, not elsewhere classified: Secondary | ICD-10-CM

## 2024-03-13 DIAGNOSIS — R600 Localized edema: Secondary | ICD-10-CM | POA: Diagnosis not present

## 2024-03-13 DIAGNOSIS — M6281 Muscle weakness (generalized): Secondary | ICD-10-CM

## 2024-03-13 DIAGNOSIS — M79662 Pain in left lower leg: Secondary | ICD-10-CM | POA: Diagnosis not present

## 2024-03-13 DIAGNOSIS — M25562 Pain in left knee: Secondary | ICD-10-CM

## 2024-03-13 NOTE — Therapy (Addendum)
 " OUTPATIENT PHYSICAL THERAPY LOWER EXTREMITY TREATMENT   Patient Name: Jeffrey [name] MRN: [id number] DOB:[birth year], 69 y.o., male Today's Date: 03/13/2024  END OF SESSION:  PT End of Session - 03/13/24 1143     Visit Number 16    Number of Visits 30    Date for Recertification  04/18/24    Authorization Type [facility]    Authorization Time Period [id number] 15 PT VISITS EXP 05/07/2025    Authorization - Visit Number 1    Authorization - Number of Visits 15    PT Start Time 1142    PT Stop Time 1234    PT Time Calculation (min) 52 min    Activity Tolerance Patient tolerated treatment well;Patient limited by pain    Behavior During Therapy WFL for tasks assessed/performed            Past Medical History:  Diagnosis Date   Aneurysm of infrarenal abdominal aorta    CAD (coronary artery disease)    4 stents   Carotid artery disease    Left ICA   Diabetes mellitus without complication (HCC)    Dysrhythmia    A. Fib   Hyperlipidemia    Intracerebral hemorrhage (HCC) 1960   Peripheral neuropathy    Post-traumatic osteoarthritis of left knee    Past Surgical History:  Procedure Laterality Date   APPENDECTOMY  1960   I & D EXTREMITY Right 06/19/2020   Procedure: IRRIGATION AND DEBRIDEMENT LONG FINGER;  Surgeon: Murrell Drivers, MD;  Location: [facility] OR;  Service: Orthopedics;  Laterality: Right;   LEFT HEART CATH  06/20/2018   ORIF FEMUR FRACTURE  1988   ORIF FEMUR FRACTURE Left 10/03/2023   Procedure: OPEN REDUCTION INTERNAL FIXATION (ORIF) DISTAL FEMUR FRACTURE;  Surgeon: Celena Sharper, MD;  Location: [facility] OR;  Service: Orthopedics;  Laterality: Left;   TOTAL KNEE ARTHROPLASTY Left 05/12/2023   Procedure: LEFT TOTAL KNEE ARTHROPLASTY;  Surgeon: Jerri Kay CHRISTELLA, MD;  Location: [facility] OR;  Service: Orthopedics;  Laterality: Left;   VARICOSE VEIN SURGERY Right    right leg   Patient Active Problem List   Diagnosis Date Noted   Periprosthetic fracture around internal prosthetic left knee  joint 10/02/2023   Supracondylar fracture of left femur (HCC) 10/02/2023   Closed left femoral fracture (HCC) 10/02/2023   Hypokalemia 10/02/2023   Status post total left knee replacement 05/12/2023   Post-traumatic osteoarthritis of left knee 01/31/2023   Atrial fibrillation (HCC) 11/26/2021   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   Occlusion and stenosis of carotid artery without mention of cerebral infarction 11/19/2013    [facility]P: [facility]  REFERRING PROVIDER: Celena Sharper, MD , Ronal Grave PA  REFERRING DIAG: femur 15 visits  Closed fracture of left femur, unspecified fracture morphology, unspecified portion of femur, initial encounter (HCC)  - Primary S72.92XA   THERAPY DIAG:  Chronic pain of left knee  Stiffness of left knee, not elsewhere classified  Unsteadiness on feet  Muscle weakness (generalized)  Localized edema  Other abnormalities of gait and mobility  Rationale for Evaluation and Treatment: Rehabilitation  ONSET DATE: 10/03/23  SUBJECTIVE:   SUBJECTIVE STATEMENT: Pt states that he has pain at mid-thigh anteriorly when getting into car and sitting in bed. He is scheduled for CT scan later this week. Has been riding on tractor to feed cows and feels good with getting off & off of tractor.   PERTINENT HISTORY: L TKA 05/12/23,  L femur fx after fall 09/30/23 ORIF 10/03/23, this is 3rd  L femur fx, CAD, DM, peripheral neuropathy  PAIN:  Are you having pain? Yes: NPRS scale: since last PT sitting 0-2/10, with activities up to 4-5/10  Pain location: left thigh distal - lateral  Pain description: dull, sharp Aggravating factors: walking, standing Relieving factors: rest, pain meds at night  PRECAUTIONS: Other: + L TKA  RED FLAGS: 3rd L femur fracture   WEIGHT BEARING RESTRICTIONS: Yes WBAT  FALLS:  Has patient fallen in last 6 months? Yes. Number of falls 1  LIVING ENVIRONMENT: Lives with: lives with their spouse Lives in:  House/apartment Stairs: Yes: External: 4 steps; can reach both Has following equipment at home: Single point cane  OCCUPATION: retired   PLOF: Independent  PATIENT GOALS: decrease pain  NEXT MD VISIT: unknown  OBJECTIVE:  Note: Objective measures were completed at Evaluation unless otherwise noted.  DIAGNOSTIC FINDINGS: 11/08/23 X-rays demonstrate evidence of consolidation at the fracture site.  No  hardware complication.   PATIENT SURVEYS:  PSFS: THE PATIENT SPECIFIC FUNCTIONAL SCALE  Place score of 0-10 (0 = unable to perform activity and 10 = able to perform activity at the same level as before injury or problem)  Activity Date: 12/13/23 02/07/24   walking 7 8   2.transfers (stand/sit & getting on/off tractor) 8 9   3.stairs  3 without handrails and >3 with handrail 2  5   4.      Total Score 5.6 7.33     Total Score = Sum of activity scores/number of activities  Minimally Detectable Change: 3 points (for single activity); 2 points (for average score)  [facility] (nd). The Patient Specific Functional Scale . Retrieved from [email or web address]   COGNITION: Overall cognitive status: Within functional limits for tasks assessed     SENSATION: + hx neuropathy feet  EDEMA: ---  MUSCLE LENGTH: Hamstrings: Right mod restriction; Left Right mod restriction    POSTURE: flexed trunk   PALPATION: 1+ tenderness at ant knee and at lateral incision  LOWER EXTREMITY ROM: hip PROM [facility]  A/P ROM Left eval Left 02/14/24  Hip flexion 100   Hip extension    Hip abduction 40   Hip adduction    Hip internal rotation    Hip external rotation    Knee flexion 90/100 Seated A: 110*   Knee extension +5 Standing -3* Seated LAQ -14*  Ankle dorsiflexion    Ankle plantarflexion    Ankle inversion    Ankle eversion     (Blank rows = not tested)  LOWER EXTREMITY MMT:  MMT Left eval Right 02/19/24  Left 02/19/24  Hip flexion 4-  5/5  Hip extension 4-  4/5  Hip abduction 4-  4/5  Hip adduction   4/5  Hip internal rotation   4-/5  Hip external rotation   4/5  Knee flexion 4  4/5  Knee extension 4- HH dynameter 79.2#, 80.6# HH dynameter 46.6#. 43.8#  Ankle dorsiflexion  3-/5 3+/5  Ankle plantarflexion  2+/5 Unable to lift heels in standing 2+/5 Unable to lift heels in standing  Ankle inversion     Ankle eversion      (Blank rows = not tested)   FUNCTIONAL TESTS:  02/19/2024: 5x STS from 18 chair with armrests 34.03 with balance issues stabilizing upon arising  Evaluation on 12/13/2023: 5 times sit to stand: 15 sec with UE use and inc sway/neuropathy  GAIT: 02/19/2024:   Gait Velocity: no AD self-selected 2.22 ft/sec and fast pace 2.70 ft/sec Pt has  antalgic pattern with LLE decreased stance duration and abducted.  Pt negotiates ramp without device with slow cautious movements with increased deviations above.  Pt negotiates curb without device requiring modified technique (up with good & down with bad) due to LLE strength.   Pt neg stairs: descending with single rail modified technique modified independent; using LLE or alternating pattern with 2 rails only with supervision.  Ascending with right rail alternating pattern but notable decreased strength in LLE with inability to reach full knee & hip extension.     Evaluation on 12/13/2023: Distance walked: short community Assistive device utilized: Single point cane Level of assistance: Modified independence Comments: ---   TODAY'S TREATMENT:   L LE   03/13/2024 TherEx  Recumbent bike seat 7 level 3 for 8 min.   TherAct  [employer] step-on, over, and down with BUE support on parallel bars; 10 reps each direction alternating foot on top of BOSU ball to focus on LE stabilization  Lateral step up on BOSU with contralateral LE reaching across midline ant with stance knee ext and post with stance knee unlocked /  flexion. Side stepping with PT demo & cues on technique engaging hips / knees progressing BUEs on //bars to single UE ipsilateral to outer LE.  Progressed to turning 90* from stationary position with ipsilateral UE to no UE support (intermittent touch for balance) Resisted gait forward walking, backward walking, and lateral side steps to Rt. and Lt.  Resisted gait with turning Rt. & Lt. 90* from stationary position with no UE support and VC cues for balance and body awareness                                                                                            TREATMENT:   L LE 02/21/2024 Therapeutic Exercise recumbent bike seat 9 level 4 for 8 min. Warm-up while PT & pt did subjective.    Therapeutic Activities: Stepping on, over and down on BOSU with BUE support //bars 5 reps ea LE progressed 5 reps stepping down to midline for slight knee rotation: when LLE on BOSU working on lift & lowering power and when RLE on BOSU working on LLE active knee flexion to clear & loading / landing on LLE with knee stability.   Lateral step up on BOSU with contralateral LE reaching across midline ant with stance knee ext and post with stance knee unlocked / flexion.   Side stepping with PT demo & cues on technique engaging hips / knees progressing BUEs on //bars to single UE ipsilateral to outer LE.  Progressed to turning 90* from stationary position with ipsilateral UE to no UE support (intermittent touch for balance) Braiding with BUE support //bars with PT cues on technique & knee control.     TREATMENT:   L LE 02/19/2024 Therapeutic Exercise recumbent bike seat 9 level 4 for 8 min. Warm-up while PT & pt did subjective.   See MMT in objective data  Physical Performance Testing: See objective data for 5x Sit to Stand test.   Gait Training: Gait Velocity: no AD self-selected 2.22 ft/sec and fast pace 2.70 ft/sec  Pt has antalgic pattern with LLE decreased stance duration and abducted.  Pt  negotiates ramp without device with slow cautious movements with increased deviations above.  Pt negotiates curb without device requiring modified technique (up with good & down with bad) due to LLE strength.   Pt neg stairs: descending with single rail modified technique modified independent; using LLE or alternating pattern with 2 rails only with supervision.  Ascending with right rail alternating pattern but notable decreased strength in LLE with inability to reach full knee & hip extension.       TREATMENT:   L LE 02/14/2024 Therapeutic Exercise recumbent bike seat 9 level 4 for 8 min.   Therapeutic Activities: Stepping on, over and down on BOSU with BUE support //bars 5 reps ea LE progressed 5 reps stepping down to midline for slight knee rotation: when LLE on BOSU working on lift & lowering power and when RLE on BOSU working on LLE active knee flexion to clear & loading / landing on LLE with knee stability.   Lateral step up on BOSU with contralateral LE reaching across midline ant with stance knee ext and post with stance knee unlocked / flexion.   Side stepping with PT demo & cues on technique engaging hips / knees progressing BUEs on //bars to single UE ipsilateral to outer LE.  Progressed to turning 90* from stationary position with ipsilateral UE to no UE support (intermittent touch for balance) Braiding with BUE support //bars with PT cues on technique & knee control.    HOME EXERCISE PROGRAM: Access Code: [email or web address] URL: [email or web address] Date: 01/24/2024 Prepared by: Grayce Spatz  Exercises - Long Sitting Quad Set with Towel Roll Under Heel  - 2 x daily - 7 x weekly - 2 sets - 10 reps - 2 hold - Active Straight Leg Raise with Quad Set  - 2 x daily - 7 x weekly - 2 sets - 10 reps - Supine Heel Slides  - 2 x daily - 7 x weekly - 2 sets - 10 reps - 2 hold - Supine Bridge  - 2 x daily - 7 x weekly - 2 sets - 10 reps - 2 hold - Mini Squat with Counter Support   - 2 x daily - 7 x weekly - 2 sets - 10 reps - Seated Table Hamstring Stretch  - 1 x daily - 7 x weekly - 1 sets - 3 reps - 30 seconds hold - Supine ITB Stretch with Strap  - 1 x daily - 7 x weekly - 1 sets - 3 reps - 30 seconds hold - Seated Hip Internal Rotation AROM  - 1 x daily - 7 x weekly - 2 sets - 10 reps - 5 seconds hold - Seated Hip External Rotation AROM  - 1 x daily - 7 x weekly - 2 sets - 10 reps - 5 seconds hold - Gastroc Stretch on Step  - 1 x daily - 7 x weekly - 1 sets - 3 reps - 30 seconds hold - Standing Hamstring Stretch with Step  - 1 x daily - 7 x weekly - 1 sets - 3 reps - 30 seconds hold - Seated Ankle Plantar Flexion with Resistance Loop  - 1 x daily - 7 x weekly - 2 sets - 10 reps - 5 seconds hold - Long sitting ankle plantarflexion with eversion  - 1 x daily - 7 x weekly - 2 sets - 10 reps - 5 seconds hold - Long sitting  ankle plantarflexion with eversion  - 1 x daily - 7 x weekly - 2 sets - 10 reps - 5 seconds hold - Standing Hip Abduction with Resistance at Thighs  - 1 x daily - 7 x weekly - 2 sets - 10 reps - 5 seconds hold - Standing Hip Extension with Resistance at Ankles and Counter Support  - 1 x daily - 7 x weekly - 2 sets - 10 reps - 5 seconds hold - Standing Hip Flexion with Anchored Resistance and Chair Support  - 1 x daily - 7 x weekly - 2 sets - 10 reps - 5 seconds hold  ASSESSMENT:  CLINICAL IMPRESSION: 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 During resisted gait, patient had a lateral trunk lean to the left side with an antalgic gait pattern and decreased knee flexion. While turning in the parallel bars, the left foot had decreased knee flexion into swing phase and a foot drag moved the visual cues on multiple attempts. With VC and visual cueing the patient improved on clearing the foot during the session. Patient will benefit from skilled PT  services to improve balance, stability, and strength of LLE for community and work activities.   OBJECTIVE IMPAIRMENTS: decreased endurance, decreased ROM, decreased strength, impaired flexibility, and pain.   ACTIVITY LIMITATIONS: squatting, stairs, and transfers  PARTICIPATION LIMITATIONS: community activity, church, and farming  PERSONAL FACTORS: 1-2 comorbidities: previous fractures, LE neuropathy are also affecting patient's functional outcome.   REHAB POTENTIAL: Good  CLINICAL DECISION MAKING: Stable/uncomplicated  EVALUATION COMPLEXITY: Moderate   GOALS: Goals reviewed with patient? Yes  SHORT TERM GOALS: Target date: 03/15/2023   Pt to be independent with updated HEP. Baseline: Goal status:  ongoing 03/13/2024  2.  Pt reports pain <4/10 with activities. Baseline: 7/10 Goal status: ongoing 03/13/2024  UPDATED LONG TERM GOALS: Target date: 04/19/2023  10weeks  Pt to be independent with ongoing HEP at time of discharge. Baseline:  Goal status: Ongoing    03/13/2024  2.  Pt able to ambulate community distances without AD and no pain. Baseline: See objective data Goal status: Ongoing     03/13/2024  3.  Left LE strength hip and knee 5/5.   Baseline: See objective data Goal status: Ongoing      03/13/2024  4.  Increase knee  standing AROM 0 extension & 105 flexion. Baseline: See objective data Goal status: Ongoing    03/13/2024  5.  Decrease max pain to 2/10 with all activities. Baseline: See subjective data Goal status: Ongoing    03/13/2024  6.  Increase PSFS score  by at least 2-3 points to demonstrate measurable difference. Baseline: See objective data Goal status: Ongoing    01/11/2025   PLAN:  PT FREQUENCY: 1-2x/week  PT DURATION: 15 additional visits over 10 weeks  PLANNED INTERVENTIONS: [id number]- PT Re-evaluation, 97110-Therapeutic exercises, 97530- Therapeutic activity, 97112- Neuromuscular re-education, 97535- Self Care, [id number]- Manual therapy, [phone]- Gait  training, [phone]- Aquatic Therapy, Patient/Family education, Balance training, Stair training, Joint mobilization, Scar mobilization, and Moist heat  PLAN FOR NEXT SESSION:  Continue to work on functional activities. Work on development worker, international aid activities. Continue to work on knee ROM.   Sherline Babe, SPT 03/13/2024, 1:23 PM  This entire session of physical therapy was performed under the direct supervision of PT signing evaluation /treatment. PT reviewed note and agrees.   Grayce Spatz, PT, DPT 03/13/2024, 9:29  PM  "

## 2024-03-15 ENCOUNTER — Ambulatory Visit (INDEPENDENT_AMBULATORY_CARE_PROVIDER_SITE_OTHER)
Admission: RE | Admit: 2024-03-15 | Discharge: 2024-03-15 | Disposition: A | Discharge status: Home / Self Care | Source: Ambulatory Visit

## 2024-03-15 DIAGNOSIS — Z96652 Presence of left artificial knee joint: Secondary | ICD-10-CM

## 2024-03-15 DIAGNOSIS — Z0189 Encounter for other specified special examinations: Secondary | ICD-10-CM

## 2024-03-18 ENCOUNTER — Encounter: Payer: Self-pay | Admitting: Physical Therapy

## 2024-03-18 ENCOUNTER — Ambulatory Visit: Admitting: Physical Therapy

## 2024-03-18 DIAGNOSIS — G8929 Other chronic pain: Secondary | ICD-10-CM | POA: Diagnosis not present

## 2024-03-18 DIAGNOSIS — M25662 Stiffness of left knee, not elsewhere classified: Secondary | ICD-10-CM | POA: Diagnosis not present

## 2024-03-18 DIAGNOSIS — R2689 Other abnormalities of gait and mobility: Secondary | ICD-10-CM | POA: Diagnosis not present

## 2024-03-18 DIAGNOSIS — R2681 Unsteadiness on feet: Secondary | ICD-10-CM

## 2024-03-18 DIAGNOSIS — M6281 Muscle weakness (generalized): Secondary | ICD-10-CM | POA: Diagnosis not present

## 2024-03-18 DIAGNOSIS — M25562 Pain in left knee: Secondary | ICD-10-CM | POA: Diagnosis not present

## 2024-03-18 DIAGNOSIS — R600 Localized edema: Secondary | ICD-10-CM | POA: Diagnosis not present

## 2024-03-18 DIAGNOSIS — M79662 Pain in left lower leg: Secondary | ICD-10-CM

## 2024-03-18 NOTE — Therapy (Signed)
 " OUTPATIENT PHYSICAL THERAPY LOWER EXTREMITY TREATMENT   Patient Name: Jeffrey [name] MRN: [id number] DOB:07/01/1955, 69 y.o., male Today's Date: 03/18/2024  END OF SESSION:  PT End of Session - 03/18/24 1137     Visit Number 17    Number of Visits 30    Date for Recertification  04/18/24    Authorization Type [facility]    Authorization Time Period [id number] 15 PT VISITS EXP 05/07/2025    Authorization - Visit Number 2    Authorization - Number of Visits 15    PT Start Time 1140    PT Stop Time 1232    PT Time Calculation (min) 52 min    Equipment Utilized During Treatment Gait belt    Activity Tolerance Patient tolerated treatment well;Patient limited by pain    Behavior During Therapy WFL for tasks assessed/performed             Past Medical History:  Diagnosis Date   Aneurysm of infrarenal abdominal aorta    CAD (coronary artery disease)    4 stents   Carotid artery disease    Left ICA   Diabetes mellitus without complication (HCC)    Dysrhythmia    A. Fib   Hyperlipidemia    Intracerebral hemorrhage (HCC) 1960   Peripheral neuropathy    Post-traumatic osteoarthritis of left knee    Past Surgical History:  Procedure Laterality Date   APPENDECTOMY  1960   I & D EXTREMITY Right 06/19/2020   Procedure: IRRIGATION AND DEBRIDEMENT LONG FINGER;  Surgeon: Murrell Drivers, MD;  Location: [facility] OR;  Service: Orthopedics;  Laterality: Right;   LEFT HEART CATH  06/20/2018   ORIF FEMUR FRACTURE  1988   ORIF FEMUR FRACTURE Left 10/03/2023   Procedure: OPEN REDUCTION INTERNAL FIXATION (ORIF) DISTAL FEMUR FRACTURE;  Surgeon: Celena Sharper, MD;  Location: [facility] OR;  Service: Orthopedics;  Laterality: Left;   TOTAL KNEE ARTHROPLASTY Left 05/12/2023   Procedure: LEFT TOTAL KNEE ARTHROPLASTY;  Surgeon: Jerri Kay CHRISTELLA, MD;  Location: [facility] OR;  Service: Orthopedics;  Laterality: Left;   VARICOSE VEIN SURGERY Right    right leg   Patient Active Problem List   Diagnosis Date Noted    Periprosthetic fracture around internal prosthetic left knee joint 10/02/2023   Supracondylar fracture of left femur (HCC) 10/02/2023   Closed left femoral fracture (HCC) 10/02/2023   Hypokalemia 10/02/2023   Status post total left knee replacement 05/12/2023   Post-traumatic osteoarthritis of left knee 01/31/2023   Atrial fibrillation (HCC) 11/26/2021   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   Occlusion and stenosis of carotid artery without mention of cerebral infarction 11/19/2013    [facility]: [facility]  REFERRING PROVIDER: Celena Sharper, MD , Ronal Grave PA  REFERRING DIAG: femur 15 visits  Closed fracture of left femur, unspecified fracture morphology, unspecified portion of femur, initial encounter (HCC)  - Primary S72.92XA   THERAPY DIAG:  Chronic pain of left knee  Stiffness of left knee, not elsewhere classified  Unsteadiness on feet  Muscle weakness (generalized)  Localized edema  Other abnormalities of gait and mobility  Pain in left lower leg  Rationale for Evaluation and Treatment: Rehabilitation  ONSET DATE: 10/03/23  SUBJECTIVE:   SUBJECTIVE STATEMENT:  Pt states that he fell on Friday in the kitchen while walking into his house and landed on his left side and is currently feeling soreness in the Lt. Hip and Lt. Knee. The fall was the result of a turn made in the kitchen. He  got a CT scan on Friday (03/15/2024) and is waiting for results.   PERTINENT HISTORY: L TKA 05/12/23,  L femur fx after fall 09/30/23 ORIF 10/03/23, this is 3rd L femur fx, CAD, DM, peripheral neuropathy  PAIN:  Are you having pain? Yes: NPRS scale: since last PT session and fall 2/10 lowest and a 5-6/10 highest with increased activity throughout the day.  Pain location: left thigh distal - lateral  Pain description: dull, sharp Aggravating factors: walking, standing Relieving factors: rest, pain meds at night  PRECAUTIONS: Other: + L TKA  RED FLAGS: 3rd L femur  fracture   WEIGHT BEARING RESTRICTIONS: Yes WBAT  FALLS:  Has patient fallen in last 6 months? Yes. Number of falls 1  LIVING ENVIRONMENT: Lives with: lives with their spouse Lives in: House/apartment Stairs: Yes: External: 4 steps; can reach both Has following equipment at home: Single point cane  OCCUPATION: retired   PLOF: Independent  PATIENT GOALS: decrease pain  NEXT MD VISIT: unknown  OBJECTIVE:  Note: Objective measures were completed at Evaluation unless otherwise noted.  DIAGNOSTIC FINDINGS: 11/08/23 X-rays demonstrate evidence of consolidation at the fracture site.  No  hardware complication.   PATIENT SURVEYS:  PSFS: THE PATIENT SPECIFIC FUNCTIONAL SCALE  Place score of 0-10 (0 = unable to perform activity and 10 = able to perform activity at the same level as before injury or problem)  Activity Date: 12/13/23 02/07/24   walking 7 8   2.transfers (stand/sit & getting on/off tractor) 8 9   3.stairs  3 without handrails and >3 with handrail 2  5   4.      Total Score 5.6 7.33     Total Score = Sum of activity scores/number of activities  Minimally Detectable Change: 3 points (for single activity); 2 points (for average score)  [facility] (nd). The Patient Specific Functional Scale . Retrieved from [email or web address]   COGNITION: Overall cognitive status: Within functional limits for tasks assessed     SENSATION: + hx neuropathy feet  EDEMA: ---  MUSCLE LENGTH: Hamstrings: Right mod restriction; Left Right mod restriction    POSTURE: flexed trunk   PALPATION: 1+ tenderness at ant knee and at lateral incision  LOWER EXTREMITY ROM: hip PROM [facility]  A/P ROM Left eval Left 02/14/24  Hip flexion 100   Hip extension    Hip abduction 40   Hip adduction    Hip internal rotation    Hip external rotation    Knee flexion 90/100 Seated A: 110*   Knee extension +5  Standing -3* Seated LAQ -14*  Ankle dorsiflexion    Ankle plantarflexion    Ankle inversion    Ankle eversion     (Blank rows = not tested)  LOWER EXTREMITY MMT:  MMT Left eval Right 02/19/24 Left 02/19/24  Hip flexion 4-  5/5  Hip extension 4-  4/5  Hip abduction 4-  4/5  Hip adduction   4/5  Hip internal rotation   4-/5  Hip external rotation   4/5  Knee flexion 4  4/5  Knee extension 4- HH dynameter 79.2#, 80.6# HH dynameter 46.6#. 43.8#  Ankle dorsiflexion  3-/5 3+/5  Ankle plantarflexion  2+/5 Unable to lift heels in standing 2+/5 Unable to lift heels in standing  Ankle inversion     Ankle eversion      (Blank rows = not tested)   FUNCTIONAL TESTS:  02/19/2024: 5x STS from 18 chair with armrests 34.03 with balance issues  stabilizing upon arising  Evaluation on 12/13/2023: 5 times sit to stand: 15 sec with UE use and inc sway/neuropathy  GAIT: 02/19/2024:   Gait Velocity: no AD self-selected 2.22 ft/sec and fast pace 2.70 ft/sec Pt has antalgic pattern with LLE decreased stance duration and abducted.  Pt negotiates ramp without device with slow cautious movements with increased deviations above.  Pt negotiates curb without device requiring modified technique (up with good & down with bad) due to LLE strength.   Pt neg stairs: descending with single rail modified technique modified independent; using LLE or alternating pattern with 2 rails only with supervision.  Ascending with right rail alternating pattern but notable decreased strength in LLE with inability to reach full knee & hip extension.     Evaluation on 12/13/2023: Distance walked: short community Assistive device utilized: Single point cane Level of assistance: Modified independence Comments: ---  TODAY'S TREATMENT:   L LE   03/18/2024 TherEx Recumbent bike seat 7; 8 min slow revolutions limited by soreness.  TherAct Side stepping with PT demo & cues on technique engaging hips / knees  progressing BUEs on //bars to single UE ipsilateral to outer LE. Side stepping with PT demo & cues on technique engaging hip. Progressed to turning 90* from stationary position with ipsilateral UE to no UE support (intermittent touch for balance) BOSU Ball step-on, over, and down with BUE support on parallel bars; 10 reps each direction alternating foot on top of BOSU ball to focus on LE stabilization  Resisted gait forward walking, backward walking, and lateral side steps to Rt. and Lt.; 20 ft.  Assessed patient ability to don jacket in standing and educate patient on safely doing this at home in a safe set up environment with chair; pt. demonstrated and verbalized understanding  Neuromuscular Re-education: (balance with upright posture) Four square step and 90* turn stepping over one 1inch PVC pipes changing direction every 3rd set; 3 x 10; ModA (1 person) 4-square stepping (without turns) CW & CCW over 1 PVC pipes 5 reps ea direction (forward & backward stepping with ea LE) pt required minA for balance reactions.    TREATMENT:   L LE   03/13/2024 TherEx  Recumbent bike seat 7 level 3 for 8 min.   TherAct  [employer] step-on, over, and down with BUE support on parallel bars; 10 reps each direction alternating foot on top of BOSU ball to focus on LE stabilization  Lateral step up on BOSU with contralateral LE reaching across midline ant with stance knee ext and post with stance knee unlocked / flexion. Side stepping with PT demo & cues on technique engaging hips / knees progressing BUEs on //bars to single UE ipsilateral to outer LE.  Progressed to turning 90* from stationary position with ipsilateral UE to no UE support (intermittent touch for balance) Resisted gait forward walking, backward walking, and lateral side steps to Rt. and Lt.  Resisted gait with turning Rt. & Lt. 90* from stationary position with no UE support and VC cues for balance and body awareness  TREATMENT:   L LE 02/21/2024 Therapeutic Exercise recumbent bike seat 9 level 4 for 8 min. Warm-up while PT & pt did subjective.    Therapeutic Activities: Stepping on, over and down on BOSU with BUE support //bars 5 reps ea LE progressed 5 reps stepping down to midline for slight knee rotation: when LLE on BOSU working on lift & lowering power and when RLE on BOSU working on LLE active knee flexion to clear & loading / landing on LLE with knee stability.   Lateral step up on BOSU with contralateral LE reaching across midline ant with stance knee ext and post with stance knee unlocked / flexion.   Side stepping with PT demo & cues on technique engaging hips / knees progressing BUEs on //bars to single UE ipsilateral to outer LE.  Progressed to turning 90* from stationary position with ipsilateral UE to no UE support (intermittent touch for balance) Braiding with BUE support //bars with PT cues on technique & knee control.     TREATMENT:   L LE 02/19/2024 Therapeutic Exercise recumbent bike seat 9 level 4 for 8 min. Warm-up while PT & pt did subjective.   See MMT in objective data  Physical Performance Testing: See objective data for 5x Sit to Stand test.   Gait Training: Gait Velocity: no AD self-selected 2.22 ft/sec and fast pace 2.70 ft/sec Pt has antalgic pattern with LLE decreased stance duration and abducted.  Pt negotiates ramp without device with slow cautious movements with increased deviations above.  Pt negotiates curb without device requiring modified technique (up with good & down with bad) due to LLE strength.   Pt neg stairs: descending with single rail modified technique modified independent; using LLE or alternating pattern with 2 rails only with supervision.  Ascending with right rail alternating pattern but notable decreased strength in LLE with inability to reach full knee & hip extension.        TREATMENT:   L LE 02/14/2024 Therapeutic Exercise recumbent bike seat 9 level 4 for 8 min.   Therapeutic Activities: Stepping on, over and down on BOSU with BUE support //bars 5 reps ea LE progressed 5 reps stepping down to midline for slight knee rotation: when LLE on BOSU working on lift & lowering power and when RLE on BOSU working on LLE active knee flexion to clear & loading / landing on LLE with knee stability.   Lateral step up on BOSU with contralateral LE reaching across midline ant with stance knee ext and post with stance knee unlocked / flexion.   Side stepping with PT demo & cues on technique engaging hips / knees progressing BUEs on //bars to single UE ipsilateral to outer LE.  Progressed to turning 90* from stationary position with ipsilateral UE to no UE support (intermittent touch for balance) Braiding with BUE support //bars with PT cues on technique & knee control.    HOME EXERCISE PROGRAM: Access Code: [email or web address] URL: [email or web address] Date: 01/24/2024 Prepared by: Grayce Spatz  Exercises - Long Sitting Quad Set with Towel Roll Under Heel  - 2 x daily - 7 x weekly - 2 sets - 10 reps - 2 hold - Active Straight Leg Raise with Quad Set  - 2 x daily - 7 x weekly - 2 sets - 10 reps - Supine Heel Slides  - 2 x daily - 7 x weekly - 2 sets - 10 reps - 2 hold - Supine Bridge  - 2 x daily - 7 x weekly -  2 sets - 10 reps - 2 hold - Mini Squat with Counter Support  - 2 x daily - 7 x weekly - 2 sets - 10 reps - Seated Table Hamstring Stretch  - 1 x daily - 7 x weekly - 1 sets - 3 reps - 30 seconds hold - Supine ITB Stretch with Strap  - 1 x daily - 7 x weekly - 1 sets - 3 reps - 30 seconds hold - Seated Hip Internal Rotation AROM  - 1 x daily - 7 x weekly - 2 sets - 10 reps - 5 seconds hold - Seated Hip External Rotation AROM  - 1 x daily - 7 x weekly - 2 sets - 10 reps - 5 seconds hold - Gastroc Stretch on Step  - 1 x daily - 7 x weekly - 1 sets - 3 reps - 30  seconds hold - Standing Hamstring Stretch with Step  - 1 x daily - 7 x weekly - 1 sets - 3 reps - 30 seconds hold - Seated Ankle Plantar Flexion with Resistance Loop  - 1 x daily - 7 x weekly - 2 sets - 10 reps - 5 seconds hold - Long sitting ankle plantarflexion with eversion  - 1 x daily - 7 x weekly - 2 sets - 10 reps - 5 seconds hold - Long sitting ankle plantarflexion with eversion  - 1 x daily - 7 x weekly - 2 sets - 10 reps - 5 seconds hold - Standing Hip Abduction with Resistance at Thighs  - 1 x daily - 7 x weekly - 2 sets - 10 reps - 5 seconds hold - Standing Hip Extension with Resistance at Ankles and Counter Support  - 1 x daily - 7 x weekly - 2 sets - 10 reps - 5 seconds hold - Standing Hip Flexion with Anchored Resistance and Chair Support  - 1 x daily - 7 x weekly - 2 sets - 10 reps - 5 seconds hold  ASSESSMENT:  CLINICAL IMPRESSION: Patient had an antalgic gait pattern, increase Lt. Lateral trunk lean, and soreness upon arrival in clinic with report of a fall on Friday (03/15/24). PT exercises focused on 90* turning due to mechanism of last 3 falls being due to turning. During resisted gait, patient required VC for hip positioning and demonstrated decreased balance in all directions as seen with corrective stepping. Patient will benefit from skilled PT services to improve balance, strength, and functional activities for community and work activities.   OBJECTIVE IMPAIRMENTS: decreased endurance, decreased ROM, decreased strength, impaired flexibility, and pain.   ACTIVITY LIMITATIONS: squatting, stairs, and transfers  PARTICIPATION LIMITATIONS: community activity, church, and farming  PERSONAL FACTORS: 1-2 comorbidities: previous fractures, LE neuropathy are also affecting patient's functional outcome.   REHAB POTENTIAL: Good  CLINICAL DECISION MAKING: Stable/uncomplicated  EVALUATION COMPLEXITY: Moderate   GOALS: Goals reviewed with patient? Yes  SHORT TERM GOALS: Target  date: 03/27/2024 (date was updated due to delay with [facility] reauthorization)   Pt to be independent with updated HEP. Baseline: Goal status:  ongoing 03/18/24   2.  Pt reports pain <4/10 with activities. Baseline: 7/10 Goal status: ongoing 03/18/24  UPDATED LONG TERM GOALS: Target date: 04/18/2024  10weeks  Pt to be independent with ongoing HEP at time of discharge. Baseline:  Goal status: Ongoing    03/18/24  2.  Pt able to ambulate community distances without AD and no pain. Baseline: See objective data Goal status: Ongoing  03/18/24  3.  Left LE strength hip and knee 5/5.   Baseline: See objective data Goal status: Ongoing     03/18/24  4.  Increase knee  standing AROM 0 extension & 105 flexion. Baseline: See objective data Goal status: Ongoing   03/18/24  5.  Decrease max pain to 2/10 with all activities. Baseline: See subjective data Goal status: Ongoing    03/18/24  6.  Increase PSFS score  by at least 2-3 points to demonstrate measurable difference. Baseline: See objective data Goal status: Ongoing  03/18/24  PLAN:  PT FREQUENCY: 1-2x/week  PT DURATION: 15 additional visits over 10 weeks  PLANNED INTERVENTIONS: [id number]- PT Re-evaluation, 97110-Therapeutic exercises, 97530- Therapeutic activity, 97112- Neuromuscular re-education, 97535- Self Care, [id number]- Manual therapy, [phone]- Gait training, [phone]- Aquatic Therapy, Patient/Family education, Balance training, Stair training, Joint mobilization, Scar mobilization, and Moist heat  PLAN FOR NEXT SESSION:  Continue to work on 90* turning and foot/hip/knee alignment during turns. Work on narrow based balance activities. Continue to work on functional activities. Knee strength    Sherline Babe, Student-PT 03/18/2024, 1:02 PM   This entire session of physical therapy was performed under the direct supervision of PT signing evaluation /treatment. PT reviewed note and agrees.   Robin Waldron, PT, DPT 03/18/2024, 4:30 PM  "

## 2024-03-20 ENCOUNTER — Ambulatory Visit (INDEPENDENT_AMBULATORY_CARE_PROVIDER_SITE_OTHER): Admitting: Physical Therapy

## 2024-03-20 ENCOUNTER — Encounter: Payer: Self-pay | Admitting: Physical Therapy

## 2024-03-20 DIAGNOSIS — R2681 Unsteadiness on feet: Secondary | ICD-10-CM | POA: Diagnosis not present

## 2024-03-20 DIAGNOSIS — M25662 Stiffness of left knee, not elsewhere classified: Secondary | ICD-10-CM

## 2024-03-20 DIAGNOSIS — R600 Localized edema: Secondary | ICD-10-CM

## 2024-03-20 DIAGNOSIS — R2689 Other abnormalities of gait and mobility: Secondary | ICD-10-CM | POA: Diagnosis not present

## 2024-03-20 DIAGNOSIS — M79662 Pain in left lower leg: Secondary | ICD-10-CM

## 2024-03-20 DIAGNOSIS — M25562 Pain in left knee: Secondary | ICD-10-CM | POA: Diagnosis not present

## 2024-03-20 DIAGNOSIS — G8929 Other chronic pain: Secondary | ICD-10-CM | POA: Diagnosis not present

## 2024-03-20 DIAGNOSIS — M6281 Muscle weakness (generalized): Secondary | ICD-10-CM | POA: Diagnosis not present

## 2024-03-20 NOTE — Therapy (Addendum)
 " OUTPATIENT PHYSICAL THERAPY LOWER EXTREMITY TREATMENT   Patient Name: Jeffrey [name] MRN: [id number] DOB:10-Dec-1955, 69 y.o., male Today's Date: 03/20/2024  END OF SESSION:  PT End of Session - 03/20/24 1146     Visit Number 18    Number of Visits 30    Date for Recertification  04/18/24    Authorization Type [facility]    Authorization Time Period [id number] 15 PT VISITS EXP 05/07/2025    Authorization - Number of Visits 15    PT Start Time 1144    PT Stop Time 1228    PT Time Calculation (min) 44 min    Equipment Utilized During Treatment Gait belt    Activity Tolerance Patient tolerated treatment well;Patient limited by pain    Behavior During Therapy WFL for tasks assessed/performed              Past Medical History:  Diagnosis Date   Aneurysm of infrarenal abdominal aorta    CAD (coronary artery disease)    4 stents   Carotid artery disease    Left ICA   Diabetes mellitus without complication (HCC)    Dysrhythmia    A. Fib   Hyperlipidemia    Intracerebral hemorrhage (HCC) 1960   Peripheral neuropathy    Post-traumatic osteoarthritis of left knee    Past Surgical History:  Procedure Laterality Date   APPENDECTOMY  1960   I & D EXTREMITY Right 06/19/2020   Procedure: IRRIGATION AND DEBRIDEMENT LONG FINGER;  Surgeon: Murrell Drivers, MD;  Location: [facility] OR;  Service: Orthopedics;  Laterality: Right;   LEFT HEART CATH  06/20/2018   ORIF FEMUR FRACTURE  1988   ORIF FEMUR FRACTURE Left 10/03/2023   Procedure: OPEN REDUCTION INTERNAL FIXATION (ORIF) DISTAL FEMUR FRACTURE;  Surgeon: Celena Sharper, MD;  Location: [facility] OR;  Service: Orthopedics;  Laterality: Left;   TOTAL KNEE ARTHROPLASTY Left 05/12/2023   Procedure: LEFT TOTAL KNEE ARTHROPLASTY;  Surgeon: Jerri Kay CHRISTELLA, MD;  Location: [facility] OR;  Service: Orthopedics;  Laterality: Left;   VARICOSE VEIN SURGERY Right    right leg   Patient Active Problem List   Diagnosis Date Noted   Periprosthetic fracture around internal  prosthetic left knee joint 10/02/2023   Supracondylar fracture of left femur (HCC) 10/02/2023   Closed left femoral fracture (HCC) 10/02/2023   Hypokalemia 10/02/2023   Status post total left knee replacement 05/12/2023   Post-traumatic osteoarthritis of left knee 01/31/2023   Atrial fibrillation (HCC) 11/26/2021   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   Occlusion and stenosis of carotid artery without mention of cerebral infarction 11/19/2013    [facility]  REFERRING PROVIDER: Celena Sharper, MD , Ronal Grave PA  REFERRING DIAG: femur 15 visits  Closed fracture of left femur, unspecified fracture morphology, unspecified portion of femur, initial encounter (HCC)  - Primary S72.92XA   THERAPY DIAG:  Chronic pain of left knee  Stiffness of left knee, not elsewhere classified  Unsteadiness on feet  Localized edema  Muscle weakness (generalized)  Other abnormalities of gait and mobility  Pain in left lower leg  Rationale for Evaluation and Treatment: Rehabilitation  ONSET DATE: 10/03/23  SUBJECTIVE:   SUBJECTIVE STATEMENT:  Patient states he was able to walk around his house, feed the cows, and get on the tractor yesterday 03/19/24. He experienced some soreness with increased activity, but has been okay since fall discussed in last session. Received CT scan and waiting to hear back from doctor.   PERTINENT HISTORY: L TKA  05/12/23,  L femur fx after fall 09/30/23 ORIF 10/03/23, this is 3rd L femur fx, CAD, DM, peripheral neuropathy  PAIN: Are you having pain? Yes: NPRS scale: since last PT session pain is about the same 2/10 lowest and 5-6/10 highest with more activity.  Pain location: left thigh distal - lateral  Pain description: dull, sharp Aggravating factors: walking, standing Relieving factors: rest, pain meds at night  PRECAUTIONS: Other: + L TKA  RED FLAGS: 3rd L femur fracture   WEIGHT BEARING RESTRICTIONS: Yes WBAT  FALLS:  Has patient fallen  in last 6 months? Yes. Number of falls 1  LIVING ENVIRONMENT: Lives with: lives with their spouse Lives in: House/apartment Stairs: Yes: External: 4 steps; can reach both Has following equipment at home: Single point cane  OCCUPATION: retired   PLOF: Independent  PATIENT GOALS: decrease pain  NEXT MD VISIT: unknown  OBJECTIVE:  Note: Objective measures were completed at Evaluation unless otherwise noted.  DIAGNOSTIC FINDINGS: 11/08/23 X-rays demonstrate evidence of consolidation at the fracture site.  No  hardware complication.   PATIENT SURVEYS:  PSFS: THE PATIENT SPECIFIC FUNCTIONAL SCALE  Place score of 0-10 (0 = unable to perform activity and 10 = able to perform activity at the same level as before injury or problem)  Activity Date: 12/13/23 02/07/24   walking 7 8   2.transfers (stand/sit & getting on/off tractor) 8 9   3.stairs  3 without handrails and >3 with handrail 2  5   4.      Total Score 5.6 7.33     Total Score = Sum of activity scores/number of activities  Minimally Detectable Change: 3 points (for single activity); 2 points (for average score)  [facility] (nd). The Patient Specific Functional Scale . Retrieved from [email or web address]   COGNITION: Overall cognitive status: Within functional limits for tasks assessed     SENSATION: + hx neuropathy feet  EDEMA: ---  MUSCLE LENGTH: Hamstrings: Right mod restriction; Left Right mod restriction    POSTURE: flexed trunk   PALPATION: 1+ tenderness at ant knee and at lateral incision  LOWER EXTREMITY ROM: hip PROM [facility]  A/P ROM Left eval Left 02/14/24  Hip flexion 100   Hip extension    Hip abduction 40   Hip adduction    Hip internal rotation    Hip external rotation    Knee flexion 90/100 Seated A: 110*   Knee extension +5 Standing -3* Seated LAQ -14*  Ankle dorsiflexion    Ankle plantarflexion    Ankle inversion     Ankle eversion     (Blank rows = not tested)  LOWER EXTREMITY MMT:  MMT Left eval Right 02/19/24 Left 02/19/24  Hip flexion 4-  5/5  Hip extension 4-  4/5  Hip abduction 4-  4/5  Hip adduction   4/5  Hip internal rotation   4-/5  Hip external rotation   4/5  Knee flexion 4  4/5  Knee extension 4- HH dynameter 79.2#, 80.6# HH dynameter 46.6#. 43.8#  Ankle dorsiflexion  3-/5 3+/5  Ankle plantarflexion  2+/5 Unable to lift heels in standing 2+/5 Unable to lift heels in standing  Ankle inversion     Ankle eversion      (Blank rows = not tested)   FUNCTIONAL TESTS:  02/19/2024: 5x STS from 18 chair with armrests 34.03 with balance issues stabilizing upon arising  Evaluation on 12/13/2023: 5 times sit to stand: 15 sec with UE use and inc sway/neuropathy  GAIT: 02/19/2024:   Gait Velocity: no AD self-selected 2.22 ft/sec and fast pace 2.70 ft/sec Pt has antalgic pattern with LLE decreased stance duration and abducted.  Pt negotiates ramp without device with slow cautious movements with increased deviations above.  Pt negotiates curb without device requiring modified technique (up with good & down with bad) due to LLE strength.   Pt neg stairs: descending with single rail modified technique modified independent; using LLE or alternating pattern with 2 rails only with supervision.  Ascending with right rail alternating pattern but notable decreased strength in LLE with inability to reach full knee & hip extension.     Evaluation on 12/13/2023: Distance walked: short community Assistive device utilized: Single point cane Level of assistance: Modified independence Comments: ---  TODAY'S TREATMENT:  L LE   03/20/2024 TherEx Recumbent bike seat 7; 8 min level 3  TherAct Lt. Side Lateral step up on BOSU with contralateral LE reaching across midline ant with stance knee ext and post with stance knee unlocked / flexion.   Neuromuscular Re-education (for LE body  awareness/proprioception and trunk control during turning activities) Tandem stance in parallel bars; 30 secs x 3 alternating Lt. And Rt. Foot in front and back with intermittent touching for balance Tandem Stance on airex pad in parallel bars; 30 secs x 3 alternating Lt. And Rt. Foot in front and back with intermittent touching for balance Four square step and 90* turn stepping over one 1inch PVC pipes changing direction every 3rd set; 3 x 10; ModA (1 person) 4-square stepping (without turns) CW & CCW over 1 PVC pipes 5 reps ea direction (forward & backward stepping with ea LE) pt required minA for balance reactions. (Progressed: to 10lb kettlebell hold in Lt and Rt. UE 3rd set both CW and CCW)  Therapist demonstration of proper patterning for 4-square stepping with and without kettlebell before patient demonstration. Therapist provided VC and tactile cues during patient demonstration for stepping safety and reactive stepping techniques.    TREATMENT:   L LE   03/18/2024 TherEx Recumbent bike seat 7; 8 min slow revolutions limited by soreness.  TherAct Side stepping with PT demo & cues on technique engaging hips / knees progressing BUEs on //bars to single UE ipsilateral to outer LE. Side stepping with PT demo & cues on technique engaging hip. Progressed to turning 90* from stationary position with ipsilateral UE to no UE support (intermittent touch for balance) BOSU Ball step-on, over, and down with BUE support on parallel bars; 10 reps each direction alternating foot on top of BOSU ball to focus on LE stabilization  Resisted gait forward walking, backward walking, and lateral side steps to Rt. and Lt.; 20 ft.  Assessed patient ability to don jacket in standing and educate patient on safely doing this at home in a safe set up environment with chair; pt. demonstrated and verbalized understanding  Neuromuscular Re-education: (balance with upright posture) Four square step and 90* turn stepping  over one 1inch PVC pipes changing direction every 3rd set; 3 x 10; ModA (1 person) 4-square stepping (without turns) CW & CCW over 1 PVC pipes 5 reps ea direction (forward & backward stepping with ea LE) pt required minA for balance reactions.    TREATMENT:   L LE   03/13/2024 TherEx  Recumbent bike seat 7 level 3 for 8 min.   TherAct  [employer] step-on, over, and down with BUE support on parallel bars; 10 reps each direction alternating foot on top of  BOSU ball to focus on LE stabilization  Lateral step up on BOSU with contralateral LE reaching across midline ant with stance knee ext and post with stance knee unlocked / flexion. Side stepping with PT demo & cues on technique engaging hips / knees progressing BUEs on //bars to single UE ipsilateral to outer LE.  Progressed to turning 90* from stationary position with ipsilateral UE to no UE support (intermittent touch for balance) Resisted gait forward walking, backward walking, and lateral side steps to Rt. and Lt.  Resisted gait with turning Rt. & Lt. 90* from stationary position with no UE support and VC cues for balance and body awareness                                                                                            TREATMENT:   L LE 02/21/2024 Therapeutic Exercise recumbent bike seat 9 level 4 for 8 min. Warm-up while PT & pt did subjective.    Therapeutic Activities: Stepping on, over and down on BOSU with BUE support //bars 5 reps ea LE progressed 5 reps stepping down to midline for slight knee rotation: when LLE on BOSU working on lift & lowering power and when RLE on BOSU working on LLE active knee flexion to clear & loading / landing on LLE with knee stability.   Lateral step up on BOSU with contralateral LE reaching across midline ant with stance knee ext and post with stance knee unlocked / flexion.   Side stepping with PT demo & cues on technique engaging hips / knees progressing BUEs on //bars to single UE  ipsilateral to outer LE.  Progressed to turning 90* from stationary position with ipsilateral UE to no UE support (intermittent touch for balance) Braiding with BUE support //bars with PT cues on technique & knee control.     HOME EXERCISE PROGRAM: Access Code: [email or web address] URL: [email or web address] Date: 01/24/2024 Prepared by: Grayce Spatz  Exercises - Long Sitting Quad Set with Towel Roll Under Heel  - 2 x daily - 7 x weekly - 2 sets - 10 reps - 2 hold - Active Straight Leg Raise with Quad Set  - 2 x daily - 7 x weekly - 2 sets - 10 reps - Supine Heel Slides  - 2 x daily - 7 x weekly - 2 sets - 10 reps - 2 hold - Supine Bridge  - 2 x daily - 7 x weekly - 2 sets - 10 reps - 2 hold - Mini Squat with Counter Support  - 2 x daily - 7 x weekly - 2 sets - 10 reps - Seated Table Hamstring Stretch  - 1 x daily - 7 x weekly - 1 sets - 3 reps - 30 seconds hold - Supine ITB Stretch with Strap  - 1 x daily - 7 x weekly - 1 sets - 3 reps - 30 seconds hold - Seated Hip Internal Rotation AROM  - 1 x daily - 7 x weekly - 2 sets - 10 reps - 5 seconds hold - Seated Hip External Rotation AROM  - 1 x daily - 7 x weekly -  2 sets - 10 reps - 5 seconds hold - Gastroc Stretch on Step  - 1 x daily - 7 x weekly - 1 sets - 3 reps - 30 seconds hold - Standing Hamstring Stretch with Step  - 1 x daily - 7 x weekly - 1 sets - 3 reps - 30 seconds hold - Seated Ankle Plantar Flexion with Resistance Loop  - 1 x daily - 7 x weekly - 2 sets - 10 reps - 5 seconds hold - Long sitting ankle plantarflexion with eversion  - 1 x daily - 7 x weekly - 2 sets - 10 reps - 5 seconds hold - Long sitting ankle plantarflexion with eversion  - 1 x daily - 7 x weekly - 2 sets - 10 reps - 5 seconds hold - Standing Hip Abduction with Resistance at Thighs  - 1 x daily - 7 x weekly - 2 sets - 10 reps - 5 seconds hold - Standing Hip Extension with Resistance at Ankles and Counter Support  - 1 x daily - 7 x weekly - 2 sets - 10 reps  - 5 seconds hold - Standing Hip Flexion with Anchored Resistance and Chair Support  - 1 x daily - 7 x weekly - 2 sets - 10 reps - 5 seconds hold  ASSESSMENT:  CLINICAL IMPRESSION: Patient has improved since fall and last visit with decreased pain and soreness before session. Upon arrival gait appeared less antalgic and there was a decreased trunk lean/limp with the use of a single point cane. 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 After these activities he does notice more soreness in the Lt knee. While in clinic, patient it improving his balance and knee strength as seen in his ability to navigate different surfaces and decrease BUE support. Although he is improving, there are still deficits in balance, LE proprioception, knee strength, and endurance as seen with the need for corrective stepping, intermittent BUE support and rest breaks. PT will benefit from skilled physical therapy to improve knee strength, body awareness, balance, and 90* turning for fall prevention.  OBJECTIVE IMPAIRMENTS: decreased endurance, decreased ROM, decreased strength, impaired flexibility, and pain.   ACTIVITY LIMITATIONS: squatting, stairs, and transfers  PARTICIPATION LIMITATIONS: community activity, church, and farming  PERSONAL FACTORS: 1-2 comorbidities: previous fractures, LE neuropathy are also affecting patient's functional outcome.   REHAB POTENTIAL: Good  CLINICAL DECISION MAKING: Stable/uncomplicated  EVALUATION COMPLEXITY: Moderate   GOALS: Goals reviewed with patient? Yes  SHORT TERM GOALS: Target date: 03/27/2024 (date was updated due to delay with [facility] reauthorization)   Pt to be independent with updated HEP. Baseline: Goal status:  ongoing 03/20/24  2.  Pt reports pain <4/10 with activities. Baseline: 7/10 Goal status: ongoing 03/20/24  UPDATED LONG TERM GOALS: Target date: 04/18/2024   10weeks  Pt to be independent with ongoing HEP at time of discharge. Baseline:  Goal status: Ongoing    03/20/24  2.  Pt able to ambulate community distances without AD and no pain. Baseline: See objective data Goal status: Ongoing     03/20/24  3.  Left LE strength hip and knee 5/5.   Baseline: See objective data Goal status: Ongoing     03/20/24  4.  Increase knee  standing AROM 0 extension & 105 flexion. Baseline: See objective data Goal status: Ongoing  03/20/24  5.  Decrease max pain to  2/10 with all activities. Baseline: See subjective data Goal status: Ongoing    03/20/24  6.  Increase PSFS score  by at least 2-3 points to demonstrate measurable difference. Baseline: See objective data Goal status: Ongoing  03/20/24  PLAN:  PT FREQUENCY: 1-2x/week  PT DURATION: 15 additional visits over 10 weeks  PLANNED INTERVENTIONS: [id number]- PT Re-evaluation, 97110-Therapeutic exercises, 97530- Therapeutic activity, 97112- Neuromuscular re-education, 97535- Self Care, [id number]- Manual therapy, [phone]- Gait training, [phone]- Aquatic Therapy, Patient/Family education, Balance training, Stair training, Joint mobilization, Scar mobilization, and Moist heat  PLAN FOR NEXT SESSION:   Check STGs, Continue to work on functional activities and 90* turning. Initiate new knee strengthening exercises with weight.    Sherline Babe, Student-PT 03/20/2024, 1:07 PM  This entire session of physical therapy was performed under the direct supervision of PT signing evaluation /treatment. PT reviewed note and agrees.   Grayce Spatz, PT, DPT 03/20/2024, 2:59 PM  "

## 2024-03-25 ENCOUNTER — Ambulatory Visit: Admitting: Physical Therapy

## 2024-03-25 ENCOUNTER — Encounter: Payer: Self-pay | Admitting: Physical Therapy

## 2024-03-25 DIAGNOSIS — R2681 Unsteadiness on feet: Secondary | ICD-10-CM

## 2024-03-25 DIAGNOSIS — M6281 Muscle weakness (generalized): Secondary | ICD-10-CM | POA: Diagnosis not present

## 2024-03-25 DIAGNOSIS — R2689 Other abnormalities of gait and mobility: Secondary | ICD-10-CM | POA: Diagnosis not present

## 2024-03-25 DIAGNOSIS — G8929 Other chronic pain: Secondary | ICD-10-CM | POA: Diagnosis not present

## 2024-03-25 DIAGNOSIS — R600 Localized edema: Secondary | ICD-10-CM | POA: Diagnosis not present

## 2024-03-25 DIAGNOSIS — M25562 Pain in left knee: Secondary | ICD-10-CM

## 2024-03-25 DIAGNOSIS — M25662 Stiffness of left knee, not elsewhere classified: Secondary | ICD-10-CM

## 2024-03-25 NOTE — Therapy (Signed)
 " OUTPATIENT PHYSICAL THERAPY LOWER EXTREMITY TREATMENT   Patient Name: EURA RADABAUGH MRN: [id number] DOB:1955-05-03, 69 y.o., male Today's Date: 03/25/2024  END OF SESSION:  PT End of Session - 03/25/24 1149     Visit Number 19    Number of Visits 30    Date for Recertification  04/18/24    Authorization Type [facility]    Authorization Time Period [id number] 15 PT VISITS EXP 05/07/2025    Authorization - Visit Number 4    Authorization - Number of Visits 15    PT Start Time 1147    PT Stop Time 1232    PT Time Calculation (min) 45 min    Equipment Utilized During Treatment Gait belt    Activity Tolerance Patient tolerated treatment well    Behavior During Therapy WFL for tasks assessed/performed          Past Medical History:  Diagnosis Date   Aneurysm of infrarenal abdominal aorta    CAD (coronary artery disease)    4 stents   Carotid artery disease    Left ICA   Diabetes mellitus without complication (HCC)    Dysrhythmia    A. Fib   Hyperlipidemia    Intracerebral hemorrhage (HCC) 1960   Peripheral neuropathy    Post-traumatic osteoarthritis of left knee    Past Surgical History:  Procedure Laterality Date   APPENDECTOMY  1960   I & D EXTREMITY Right 06/19/2020   Procedure: IRRIGATION AND DEBRIDEMENT LONG FINGER;  Surgeon: Murrell Drivers, MD;  Location: [facility] OR;  Service: Orthopedics;  Laterality: Right;   LEFT HEART CATH  06/20/2018   ORIF FEMUR FRACTURE  1988   ORIF FEMUR FRACTURE Left 10/03/2023   Procedure: OPEN REDUCTION INTERNAL FIXATION (ORIF) DISTAL FEMUR FRACTURE;  Surgeon: Celena Sharper, MD;  Location: [facility] OR;  Service: Orthopedics;  Laterality: Left;   TOTAL KNEE ARTHROPLASTY Left 05/12/2023   Procedure: LEFT TOTAL KNEE ARTHROPLASTY;  Surgeon: Jerri Kay CHRISTELLA, MD;  Location: [facility] OR;  Service: Orthopedics;  Laterality: Left;   VARICOSE VEIN SURGERY Right    right leg   Patient Active Problem List   Diagnosis Date Noted   Periprosthetic fracture around internal  prosthetic left knee joint 10/02/2023   Supracondylar fracture of left femur (HCC) 10/02/2023   Closed left femoral fracture (HCC) 10/02/2023   Hypokalemia 10/02/2023   Status post total left knee replacement 05/12/2023   Post-traumatic osteoarthritis of left knee 01/31/2023   Atrial fibrillation (HCC) 11/26/2021   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   Occlusion and stenosis of carotid artery without mention of cerebral infarction 11/19/2013    [facility]: [facility]  REFERRING PROVIDER: Celena Sharper, MD , Ronal Grave PA  REFERRING DIAG: femur 15 visits  Closed fracture of left femur, unspecified fracture morphology, unspecified portion of femur, initial encounter (HCC)  - Primary S72.92XA   THERAPY DIAG:  Chronic pain of left knee  Stiffness of left knee, not elsewhere classified  Unsteadiness on feet  Localized edema  Muscle weakness (generalized)  Other abnormalities of gait and mobility  Rationale for Evaluation and Treatment: Rehabilitation  ONSET DATE: 10/03/23  SUBJECTIVE:   SUBJECTIVE STATEMENT:  MD who ordered CT scan will be in office on 1/21.  No falls.    PERTINENT HISTORY: L TKA 05/12/23,  L femur fx after fall 09/30/23 ORIF 10/03/23, this is 3rd L femur fx, CAD, DM, peripheral neuropathy  PAIN: Are you having pain? Yes: NPRS scale: since last PT session pain is about the  same 2/10 lowest and 5-6/10 highest with more activity.  Pain location: left thigh distal - lateral  Pain description: dull, sharp Aggravating factors: walking, standing Relieving factors: rest, pain meds at night  PRECAUTIONS: Other: + L TKA  RED FLAGS: 3rd L femur fracture   WEIGHT BEARING RESTRICTIONS: Yes WBAT  FALLS:  Has patient fallen in last 6 months? Yes. Number of falls 1  LIVING ENVIRONMENT: Lives with: lives with their spouse Lives in: House/apartment Stairs: Yes: External: 4 steps; can reach both Has following equipment at home: Single point  cane  OCCUPATION: retired   PLOF: Independent  PATIENT GOALS: decrease pain  NEXT MD VISIT: unknown  OBJECTIVE:  Note: Objective measures were completed at Evaluation unless otherwise noted.  DIAGNOSTIC FINDINGS: 11/08/23 X-rays demonstrate evidence of consolidation at the fracture site.  No  hardware complication.   PATIENT SURVEYS:  PSFS: THE PATIENT SPECIFIC FUNCTIONAL SCALE  Place score of 0-10 (0 = unable to perform activity and 10 = able to perform activity at the same level as before injury or problem)  Activity Date: 12/13/23 02/07/24   walking 7 8   2.transfers (stand/sit & getting on/off tractor) 8 9   3.stairs  3 without handrails and >3 with handrail 2  5   4.      Total Score 5.6 7.33     Total Score = Sum of activity scores/number of activities  Minimally Detectable Change: 3 points (for single activity); 2 points (for average score)  [facility] (nd). The Patient Specific Functional Scale . Retrieved from [email or web address]   COGNITION: Overall cognitive status: Within functional limits for tasks assessed     SENSATION: + hx neuropathy feet  EDEMA: ---  MUSCLE LENGTH: Hamstrings: Right mod restriction; Left Right mod restriction    POSTURE: flexed trunk   PALPATION: 1+ tenderness at ant knee and at lateral incision  LOWER EXTREMITY ROM: hip PROM [facility]  A/P ROM Left eval Left 02/14/24  Hip flexion 100   Hip extension    Hip abduction 40   Hip adduction    Hip internal rotation    Hip external rotation    Knee flexion 90/100 Seated A: 110*   Knee extension +5 Standing -3* Seated LAQ -14*  Ankle dorsiflexion    Ankle plantarflexion    Ankle inversion    Ankle eversion     (Blank rows = not tested)  LOWER EXTREMITY MMT:  MMT Left eval Right 02/19/24 Left 02/19/24  Hip flexion 4-  5/5  Hip extension 4-  4/5  Hip abduction 4-  4/5  Hip adduction   4/5  Hip internal  rotation   4-/5  Hip external rotation   4/5  Knee flexion 4  4/5  Knee extension 4- HH dynameter 79.2#, 80.6# HH dynameter 46.6#. 43.8#  Ankle dorsiflexion  3-/5 3+/5  Ankle plantarflexion  2+/5 Unable to lift heels in standing 2+/5 Unable to lift heels in standing  Ankle inversion     Ankle eversion      (Blank rows = not tested)   FUNCTIONAL TESTS:  02/19/2024: 5x STS from 18 chair with armrests 34.03 with balance issues stabilizing upon arising  Evaluation on 12/13/2023: 5 times sit to stand: 15 sec with UE use and inc sway/neuropathy  GAIT: 02/19/2024:   Gait Velocity: no AD self-selected 2.22 ft/sec and fast pace 2.70 ft/sec Pt has antalgic pattern with LLE decreased stance duration and abducted.  Pt negotiates ramp without device with slow cautious  movements with increased deviations above.  Pt negotiates curb without device requiring modified technique (up with good & down with bad) due to LLE strength.   Pt neg stairs: descending with single rail modified technique modified independent; using LLE or alternating pattern with 2 rails only with supervision.  Ascending with right rail alternating pattern but notable decreased strength in LLE with inability to reach full knee & hip extension.     Evaluation on 12/13/2023: Distance walked: short community Assistive device utilized: Single point cane Level of assistance: Modified independence Comments: ---  TODAY'S TREATMENT:  L LE   03/25/2024 TherEx SciFit  (pedal longest lever arm) Recumbent bike seat 10; 8 min   TherAct BOSU Ball step-on, over, and down with BUE support on parallel bars; 5 reps each direction alternating foot on top of BOSU ball to focus on LE stabilization Lt. Side Lateral step up on BOSU with contralateral LE reaching across midline ant with stance knee ext and post with stance knee unlocked / flexion. 10 reps Stepping onto Airex with contralateral LE tapping knee to counter then stepping back  to floor with BUE finger tip support on counter 10 reps with each LE Side step on/off Airex with BUE support on upper cabinets (facilitate upright posture) 10 reps with each LE.   Theraband (green) stepping with cane support in RUE: 10 reps ea flexion, abd, ext & add.  PT minA for balance.   Neuromuscular Re-education (for LE body awareness/proprioception and trunk control during turning activities) 90* stepping over one 1inch PVC pipes 10 reps to right & to left with min-modA for balance.  4-square stepping (without turns) CW & CCW over 1 PVC pipes 3 reps ea direction (forward & backward stepping with ea LE) pt required minA for balance reactions. Progressed to holding 10# kettle bell in UE 2 reps in RUE & 2 reps in LUE.   Standing on Airex mat: tapping four corners for SLS stability with BUE finger tip support on upper cabinet. 5 reps with ea LE. minA for balance.    TREATMENT:  L LE   03/20/2024 TherEx Recumbent bike seat 7; 8 min level 3  TherAct Lt. Side Lateral step up on BOSU with contralateral LE reaching across midline ant with stance knee ext and post with stance knee unlocked / flexion.   Neuromuscular Re-education (for LE body awareness/proprioception and trunk control during turning activities) Tandem stance in parallel bars; 30 secs x 3 alternating Lt. And Rt. Foot in front and back with intermittent touching for balance Tandem Stance on airex pad in parallel bars; 30 secs x 3 alternating Lt. And Rt. Foot in front and back with intermittent touching for balance Four square step and 90* turn stepping over one 1inch PVC pipes changing direction every 3rd set; 3 x 10; ModA (1 person) 4-square stepping (without turns) CW & CCW over 1 PVC pipes 5 reps ea direction (forward & backward stepping with ea LE) pt required minA for balance reactions. (Progressed: to 10lb kettlebell hold in Lt and Rt. UE 3rd set both CW and CCW)  Therapist demonstration of proper patterning for 4-square  stepping with and without kettlebell before patient demonstration. Therapist provided VC and tactile cues during patient demonstration for stepping safety and reactive stepping techniques.    TREATMENT:   L LE   03/18/2024 TherEx Recumbent bike seat 7; 8 min slow revolutions limited by soreness.  TherAct Side stepping with PT demo & cues on technique engaging hips / knees progressing  BUEs on //bars to single UE ipsilateral to outer LE. Side stepping with PT demo & cues on technique engaging hip. Progressed to turning 90* from stationary position with ipsilateral UE to no UE support (intermittent touch for balance) BOSU Ball step-on, over, and down with BUE support on parallel bars; 10 reps each direction alternating foot on top of BOSU ball to focus on LE stabilization  Resisted gait forward walking, backward walking, and lateral side steps to Rt. and Lt.; 20 ft.  Assessed patient ability to don jacket in standing and educate patient on safely doing this at home in a safe set up environment with chair; pt. demonstrated and verbalized understanding  Neuromuscular Re-education: (balance with upright posture) Four square step and 90* turn stepping over one 1inch PVC pipes changing direction every 3rd set; 3 x 10; ModA (1 person) 4-square stepping (without turns) CW & CCW over 1 PVC pipes 5 reps ea direction (forward & backward stepping with ea LE) pt required minA for balance reactions.    TREATMENT:   L LE   03/13/2024 TherEx  Recumbent bike seat 7 level 3 for 8 min.   TherAct  [employer] step-on, over, and down with BUE support on parallel bars; 10 reps each direction alternating foot on top of BOSU ball to focus on LE stabilization  Lateral step up on BOSU with contralateral LE reaching across midline ant with stance knee ext and post with stance knee unlocked / flexion. Side stepping with PT demo & cues on technique engaging hips / knees progressing BUEs on //bars to single UE ipsilateral  to outer LE.  Progressed to turning 90* from stationary position with ipsilateral UE to no UE support (intermittent touch for balance) Resisted gait forward walking, backward walking, and lateral side steps to Rt. and Lt.  Resisted gait with turning Rt. & Lt. 90* from stationary position with no UE support and VC cues for balance and body awareness     HOME EXERCISE PROGRAM: Access Code: [email or web address] URL: [email or web address] Date: 01/24/2024 Prepared by: Grayce Spatz  Exercises - Long Sitting Quad Set with Towel Roll Under Heel  - 2 x daily - 7 x weekly - 2 sets - 10 reps - 2 hold - Active Straight Leg Raise with Quad Set  - 2 x daily - 7 x weekly - 2 sets - 10 reps - Supine Heel Slides  - 2 x daily - 7 x weekly - 2 sets - 10 reps - 2 hold - Supine Bridge  - 2 x daily - 7 x weekly - 2 sets - 10 reps - 2 hold - Mini Squat with Counter Support  - 2 x daily - 7 x weekly - 2 sets - 10 reps - Seated Table Hamstring Stretch  - 1 x daily - 7 x weekly - 1 sets - 3 reps - 30 seconds hold - Supine ITB Stretch with Strap  - 1 x daily - 7 x weekly - 1 sets - 3 reps - 30 seconds hold - Seated Hip Internal Rotation AROM  - 1 x daily - 7 x weekly - 2 sets - 10 reps - 5 seconds hold - Seated Hip External Rotation AROM  - 1 x daily - 7 x weekly - 2 sets - 10 reps - 5 seconds hold - Gastroc Stretch on Step  - 1 x daily - 7 x weekly - 1 sets - 3 reps - 30 seconds hold - Standing Hamstring Stretch with Step  -  1 x daily - 7 x weekly - 1 sets - 3 reps - 30 seconds hold - Seated Ankle Plantar Flexion with Resistance Loop  - 1 x daily - 7 x weekly - 2 sets - 10 reps - 5 seconds hold - Long sitting ankle plantarflexion with eversion  - 1 x daily - 7 x weekly - 2 sets - 10 reps - 5 seconds hold - Long sitting ankle plantarflexion with eversion  - 1 x daily - 7 x weekly - 2 sets - 10 reps - 5 seconds hold - Standing Hip Abduction with Resistance at Thighs  - 1 x daily - 7 x weekly - 2 sets - 10 reps -  5 seconds hold - Standing Hip Extension with Resistance at Ankles and Counter Support  - 1 x daily - 7 x weekly - 2 sets - 10 reps - 5 seconds hold - Standing Hip Flexion with Anchored Resistance and Chair Support  - 1 x daily - 7 x weekly - 2 sets - 10 reps - 5 seconds hold  ASSESSMENT:  CLINICAL IMPRESSION: patient continues to be limited by balance and weakness. However he does show slow improvement with activities.  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    OBJECTIVE IMPAIRMENTS: decreased endurance, decreased ROM, decreased strength, impaired flexibility, and pain.   ACTIVITY LIMITATIONS: squatting, stairs, and transfers  PARTICIPATION LIMITATIONS: community activity, church, and farming  PERSONAL FACTORS: 1-2 comorbidities: previous fractures, LE neuropathy are also affecting patient's functional outcome.   REHAB POTENTIAL: Good  CLINICAL DECISION MAKING: Stable/uncomplicated  EVALUATION COMPLEXITY: Moderate   GOALS: Goals reviewed with patient? Yes  SHORT TERM GOALS: Target date: 03/27/2024 (date was updated due to delay with [facility] reauthorization)   Pt to be independent with updated HEP. Baseline: Goal status:  ongoing  03/25/2024  2.  Pt reports pain <4/10 with activities. Baseline: 7/10 Goal status: ongoing  03/25/2024  UPDATED LONG TERM GOALS: Target date: 04/18/2024  10weeks  Pt to be independent with ongoing HEP at time of discharge. Baseline:  Goal status: Ongoing     03/25/2024  2.  Pt able to ambulate community distances without AD and no pain. Baseline: See objective data Goal status: Ongoing     03/25/2024  3.  Left LE strength hip and knee 5/5.   Baseline: See objective data Goal status: Ongoing     03/25/2024  4.  Increase knee  standing AROM 0 extension & 105 flexion. Baseline: See objective data Goal status: Ongoing   03/25/2024  5.  Decrease max pain to 2/10 with all activities. Baseline: See  subjective data Goal status: Ongoing    03/25/2024  6.  Increase PSFS score  by at least 2-3 points to demonstrate measurable difference. Baseline: See objective data Goal status: Ongoing  03/25/2024  PLAN:  PT FREQUENCY: 1-2x/week  PT DURATION: 15 additional visits over 10 weeks  PLANNED INTERVENTIONS: [id number]- PT Re-evaluation, 97110-Therapeutic exercises, 97530- Therapeutic activity, 97112- Neuromuscular re-education, 97535- Self Care, [id number]- Manual therapy, [phone]- Gait training, [phone]- Aquatic Therapy, Patient/Family education, Balance training, Stair training, Joint mobilization, Scar mobilization, and Moist heat  PLAN FOR NEXT SESSION:   Check STGs, add theraband kicks to HEP including instruction in safe environment.   Continue to work on functional activities and 90* turning.   Grayce Spatz, PT, DPT 03/25/2024, 12:55 PM  "

## 2024-03-26 NOTE — Therapy (Incomplete)
 " OUTPATIENT PHYSICAL THERAPY LOWER EXTREMITY TREATMENT   Patient Name: Jeffrey [name] MRN: [id number] DOB:1955-12-11, 69 y.o., male Today's Date: 03/27/2024  END OF SESSION:  PT End of Session - 03/27/24 1143     Visit Number 20    Number of Visits 30    Date for Recertification  04/18/24    [facility]ization Type [facility]    Authorization Time Period [id number] 15 PT VISITS EXP 05/07/2025    Authorization - Visit Number 5    Authorization - Number of Visits 15    PT Start Time 1144    PT Stop Time 1228    PT Time Calculation (min) 44 min    Equipment Utilized During Treatment Gait belt    Activity Tolerance Patient tolerated treatment well    Behavior During Therapy WFL for tasks assessed/performed           Past Medical History:  Diagnosis Date   Aneurysm of infrarenal abdominal aorta    CAD (coronary artery disease)    4 stents   Carotid artery disease    Left ICA   Diabetes mellitus without complication (HCC)    Dysrhythmia    A. Fib   Hyperlipidemia    Intracerebral hemorrhage (HCC) 1960   Peripheral neuropathy    Post-traumatic osteoarthritis of left knee    Past Surgical History:  Procedure Laterality Date   APPENDECTOMY  1960   I & D EXTREMITY Right 06/19/2020   Procedure: IRRIGATION AND DEBRIDEMENT LONG FINGER;  Surgeon: Murrell Drivers, MD;  Location: [facility] OR;  Service: Orthopedics;  Laterality: Right;   LEFT HEART CATH  06/20/2018   ORIF FEMUR FRACTURE  1988   ORIF FEMUR FRACTURE Left 10/03/2023   Procedure: OPEN REDUCTION INTERNAL FIXATION (ORIF) DISTAL FEMUR FRACTURE;  Surgeon: Celena Sharper, MD;  Location: [facility] OR;  Service: Orthopedics;  Laterality: Left;   TOTAL KNEE ARTHROPLASTY Left 05/12/2023   Procedure: LEFT TOTAL KNEE ARTHROPLASTY;  Surgeon: Jerri Kay CHRISTELLA, MD;  Location: [facility] OR;  Service: Orthopedics;  Laterality: Left;   VARICOSE VEIN SURGERY Right    right leg   Patient Active Problem List   Diagnosis Date Noted   Periprosthetic fracture around internal  prosthetic left knee joint 10/02/2023   Supracondylar fracture of left femur (HCC) 10/02/2023   Closed left femoral fracture (HCC) 10/02/2023   Hypokalemia 10/02/2023   Status post total left knee replacement 05/12/2023   Post-traumatic osteoarthritis of left knee 01/31/2023   Atrial fibrillation (HCC) 11/26/2021   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   Occlusion and stenosis of carotid artery without mention of cerebral infarction 11/19/2013    [facility]  REFERRING PROVIDER: Celena Sharper, MD , Ronal Grave PA  REFERRING DIAG: femur 15 visits  Closed fracture of left femur, unspecified fracture morphology, unspecified portion of femur, initial encounter (HCC)  - Primary S72.92XA   THERAPY DIAG:  Chronic pain of left knee  Localized edema  Stiffness of left knee, not elsewhere classified  Muscle weakness (generalized)  Unsteadiness on feet  Other abnormalities of gait and mobility  Rationale for Evaluation and Treatment: Rehabilitation  ONSET DATE: 10/03/23  SUBJECTIVE:   SUBJECTIVE STATEMENT:  [facility]. He has taken the physical disc with the scan to the [place] and waiting to hear follow up on results from multiple physicians.   PERTINENT HISTORY: L TKA 05/12/23,  L femur fx after fall 09/30/23 ORIF 10/03/23, this is 3rd L  femur fx, CAD, DM, peripheral neuropathy  PAIN: Are you having pain? Yes: NPRS scale: Since last visit with increased activity 6/10 ; rest 2/10  Pain location: left thigh distal - lateral  Pain description: dull, sharp Aggravating factors: walking, standing Relieving factors: rest, pain meds at night  PRECAUTIONS: Other: + L TKA  RED FLAGS: 3rd L femur fracture   WEIGHT BEARING RESTRICTIONS: Yes WBAT  FALLS:  Has patient fallen in last 6 months? Yes. Number of falls 1  LIVING ENVIRONMENT: Lives with: lives with their spouse Lives in: House/apartment Stairs: Yes:  External: 4 steps; can reach both Has following equipment at home: Single point cane  OCCUPATION: retired   PLOF: Independent  PATIENT GOALS: decrease pain  NEXT MD VISIT: unknown  OBJECTIVE:  Note: Objective measures were completed at Evaluation unless otherwise noted.  DIAGNOSTIC FINDINGS: 11/08/23 X-rays demonstrate evidence of consolidation at the fracture site.  No  hardware complication.   PATIENT SURVEYS:  PSFS: THE PATIENT SPECIFIC FUNCTIONAL SCALE  Place score of 0-10 (0 = unable to perform activity and 10 = able to perform activity at the same level as before injury or problem)  Activity Date: 12/13/23 02/07/24   walking 7 8   2.transfers (stand/sit & getting on/off tractor) 8 9   3.stairs  3 without handrails and >3 with handrail 2  5   4.      Total Score 5.6 7.33     Total Score = Sum of activity scores/number of activities  Minimally Detectable Change: 3 points (for single activity); 2 points (for average score)  [facility] (nd). The Patient Specific Functional Scale . Retrieved from [email or web address]   COGNITION: Overall cognitive status: Within functional limits for tasks assessed     SENSATION: + hx neuropathy feet  EDEMA: ---  MUSCLE LENGTH: Hamstrings: Right mod restriction; Left Right mod restriction    POSTURE: flexed trunk   PALPATION: 1+ tenderness at ant knee and at lateral incision  LOWER EXTREMITY ROM: hip PROM [facility]  A/P ROM Left eval Left 02/14/24  Hip flexion 100   Hip extension    Hip abduction 40   Hip adduction    Hip internal rotation    Hip external rotation    Knee flexion 90/100 Seated A: 110*   Knee extension +5 Standing -3* Seated LAQ -14*  Ankle dorsiflexion    Ankle plantarflexion    Ankle inversion    Ankle eversion     (Blank rows = not tested)  LOWER EXTREMITY MMT:  MMT Left eval Right 02/19/24 Left 02/19/24  Hip flexion 4-  5/5   Hip extension 4-  4/5  Hip abduction 4-  4/5  Hip adduction   4/5  Hip internal rotation   4-/5  Hip external rotation   4/5  Knee flexion 4  4/5  Knee extension 4- HH dynameter 79.2#, 80.6# HH dynameter 46.6#. 43.8#  Ankle dorsiflexion  3-/5 3+/5  Ankle plantarflexion  2+/5 Unable to lift heels in standing 2+/5 Unable to lift heels in standing  Ankle inversion     Ankle eversion      (Blank rows = not tested)   FUNCTIONAL TESTS:  02/19/2024: 5x STS from 18 chair with armrests 34.03 with balance issues stabilizing upon arising  Evaluation on 12/13/2023: 5 times sit to stand: 15 sec with UE use and inc sway/neuropathy  GAIT: 02/19/2024:   Gait Velocity: no AD self-selected 2.22 ft/sec and fast pace 2.70 ft/sec Pt has antalgic pattern  with LLE decreased stance duration and abducted.  Pt negotiates ramp without device with slow cautious movements with increased deviations above.  Pt negotiates curb without device requiring modified technique (up with good & down with bad) due to LLE strength.   Pt neg stairs: descending with single rail modified technique modified independent; using LLE or alternating pattern with 2 rails only with supervision.  Ascending with right rail alternating pattern but notable decreased strength in LLE with inability to reach full knee & hip extension.     Evaluation on 12/13/2023: Distance walked: short community Assistive device utilized: Single point cane Level of assistance: Modified independence Comments: ---  TODAY'S TREATMENT 03/27/24  TherEx Recumbent bike seat 8 for 8 min; level 3  TherAct Lt. Side Lateral step up on BOSU with contralateral LE reaching across midline ant with stance knee ext and post with stance knee unlocked / flexion. 2 sets 10 reps each leg Theraband (green) stepping with cane support in RUE: 10 reps ea flexion, abd, ext & add.  PT minA for balance.  Resisted gait with turning Rt. & Lt. 90* from stationary  position with no UE support and VC cues for balance and body awareness. (Progression: turning into a wide 180* turn that was slow and controlled with increased awareness on foot placement and weight shift from stationary position to simulate a natural turn > walk transition )   Assessed patient ability to don jacket in standing and educate patient on safely doing this at home in a safe set up environment with chair; pt. demonstrated and verbalized understanding. Has been practicing at home.    TREATMENT:  L LE   03/25/2024 TherEx SciFit  (pedal longest lever arm); 8 min   TherAct BOSU Ball step-on, over, and down with BUE support on parallel bars; 5 reps each direction alternating foot on top of BOSU ball to focus on LE stabilization Lt. Side Lateral step up on BOSU with contralateral LE reaching across midline ant with stance knee ext and post with stance knee unlocked / flexion. 10 reps Stepping onto Airex with contralateral LE tapping knee to counter then stepping back to floor with BUE finger tip support on counter 10 reps with each LE Side step on/off Airex with BUE support on upper cabinets (facilitate upright posture) 10 reps with each LE.   Theraband (green) stepping with cane support in RUE: 10 reps ea flexion, abd, ext & add.  PT minA for balance.   Neuromuscular Re-education (for LE body awareness/proprioception and trunk control during turning activities) 90* stepping over one 1inch PVC pipes 10 reps to right & to left with min-modA for balance.  4-square stepping (without turns) CW & CCW over 1 PVC pipes 3 reps ea direction (forward & backward stepping with ea LE) pt required minA for balance reactions. Progressed to holding 10# kettle bell in UE 2 reps in RUE & 2 reps in LUE.   Standing on Airex mat: tapping four corners for SLS stability with BUE finger tip support on upper cabinet. 5 reps with ea LE. minA for balance.    TREATMENT:  L LE   03/20/2024 TherEx Recumbent bike seat  7; 8 min level 3  TherAct Lt. Side Lateral step up on BOSU with contralateral LE reaching across midline ant with stance knee ext and post with stance knee unlocked / flexion.   Neuromuscular Re-education (for LE body awareness/proprioception and trunk control during turning activities) Tandem stance in parallel bars; 30 secs x 3 alternating  Lt. And Rt. Foot in front and back with intermittent touching for balance Tandem Stance on airex pad in parallel bars; 30 secs x 3 alternating Lt. And Rt. Foot in front and back with intermittent touching for balance Four square step and 90* turn stepping over one 1inch PVC pipes changing direction every 3rd set; 3 x 10; ModA (1 person) 4-square stepping (without turns) CW & CCW over 1 PVC pipes 5 reps ea direction (forward & backward stepping with ea LE) pt required minA for balance reactions. (Progressed: to 10lb kettlebell hold in Lt and Rt. UE 3rd set both CW and CCW)  Therapist demonstration of proper patterning for 4-square stepping with and without kettlebell before patient demonstration. Therapist provided VC and tactile cues during patient demonstration for stepping safety and reactive stepping techniques.    TREATMENT:   L LE   03/18/2024 TherEx Recumbent bike seat 7; 8 min slow revolutions limited by soreness.  TherAct Side stepping with PT demo & cues on technique engaging hips / knees progressing BUEs on //bars to single UE ipsilateral to outer LE. Side stepping with PT demo & cues on technique engaging hip. Progressed to turning 90* from stationary position with ipsilateral UE to no UE support (intermittent touch for balance) BOSU Ball step-on, over, and down with BUE support on parallel bars; 10 reps each direction alternating foot on top of BOSU ball to focus on LE stabilization  Resisted gait forward walking, backward walking, and lateral side steps to Rt. and Lt.; 20 ft.  Assessed patient ability to don jacket in standing and educate  patient on safely doing this at home in a safe set up environment with chair; pt. demonstrated and verbalized understanding  Neuromuscular Re-education: (balance with upright posture) Four square step and 90* turn stepping over one 1inch PVC pipes changing direction every 3rd set; 3 x 10; ModA (1 person) 4-square stepping (without turns) CW & CCW over 1 PVC pipes 5 reps ea direction (forward & backward stepping with ea LE) pt required minA for balance reactions.    HOME EXERCISE PROGRAM: Access Code: [email or web address] URL: [email or web address] Date: 01/24/2024 Prepared by: Grayce Spatz  Exercises - Long Sitting Quad Set with Towel Roll Under Heel  - 2 x daily - 7 x weekly - 2 sets - 10 reps - 2 hold - Active Straight Leg Raise with Quad Set  - 2 x daily - 7 x weekly - 2 sets - 10 reps - Supine Heel Slides  - 2 x daily - 7 x weekly - 2 sets - 10 reps - 2 hold - Supine Bridge  - 2 x daily - 7 x weekly - 2 sets - 10 reps - 2 hold - Mini Squat with Counter Support  - 2 x daily - 7 x weekly - 2 sets - 10 reps - Seated Table Hamstring Stretch  - 1 x daily - 7 x weekly - 1 sets - 3 reps - 30 seconds hold - Supine ITB Stretch with Strap  - 1 x daily - 7 x weekly - 1 sets - 3 reps - 30 seconds hold - Seated Hip Internal Rotation AROM  - 1 x daily - 7 x weekly - 2 sets - 10 reps - 5 seconds hold - Seated Hip External Rotation AROM  - 1 x daily - 7 x weekly - 2 sets - 10 reps - 5 seconds hold - Gastroc Stretch on Step  - 1 x daily - 7 x weekly - 1  sets - 3 reps - 30 seconds hold - Standing Hamstring Stretch with Step  - 1 x daily - 7 x weekly - 1 sets - 3 reps - 30 seconds hold - Seated Ankle Plantar Flexion with Resistance Loop  - 1 x daily - 7 x weekly - 2 sets - 10 reps - 5 seconds hold - Long sitting ankle plantarflexion with eversion  - 1 x daily - 7 x weekly - 2 sets - 10 reps - 5 seconds hold - Long sitting ankle plantarflexion with eversion  - 1 x daily - 7 x weekly - 2 sets - 10 reps  - 5 seconds hold - Standing Hip Abduction with Resistance at Thighs  - 1 x daily - 7 x weekly - 2 sets - 10 reps - 5 seconds hold - Standing Hip Extension with Resistance at Ankles and Counter Support  - 1 x daily - 7 x weekly - 2 sets - 10 reps - 5 seconds hold - Standing Hip Flexion with Anchored Resistance and Chair Support  - 1 x daily - 7 x weekly - 2 sets - 10 reps - 5 seconds hold  ASSESSMENT:  CLINICAL IMPRESSION: Patient is improving turning capability during gait as seen in ability to make more turns before transitioning into natural gait pattern. Patient still demonstrates challenges with balance as seen with corrective stepping and reliance on single point cane or chair for safety. He requires minA during single limb stance activities for balance, but he is building strength in the Lt knee with increase repetitions. Patient also is improving with standing activities like donning a jacket in standing, but continues to practice at home in a safe environment for balance control. Patient will continue to benefit from skilled PT services to improve Lt knee strength, balance/coordination, functional activity endurance, and turning during gait.  OBJECTIVE IMPAIRMENTS: decreased endurance, decreased ROM, decreased strength, impaired flexibility, and pain.   ACTIVITY LIMITATIONS: squatting, stairs, and transfers  PARTICIPATION LIMITATIONS: community activity, church, and farming  PERSONAL FACTORS: 1-2 comorbidities: previous fractures, LE neuropathy are also affecting patient's functional outcome.   REHAB POTENTIAL: Good  CLINICAL DECISION MAKING: Stable/uncomplicated  EVALUATION COMPLEXITY: Moderate   GOALS: Goals reviewed with patient? Yes  SHORT TERM GOALS: Target date: 03/27/2024 (date was updated due to delay with [facility] reauthorization)   Pt to be independent with updated HEP. Baseline: Goal status:  ongoing  03/27/2024  2.  Pt reports pain <4/10 with activities. Baseline:  7/10 Goal status: ongoing  03/27/2024  UPDATED LONG TERM GOALS: Target date: 04/18/2024  10weeks  Pt to be independent with ongoing HEP at time of discharge. Baseline:  Goal status: Ongoing     03/27/2024  2.  Pt able to ambulate community distances without AD and no pain. Baseline: See objective data Goal status: Ongoing     03/27/2024  3.  Left LE strength hip and knee 5/5.   Baseline: See objective data Goal status: Ongoing     03/27/2024  4.  Increase knee  standing AROM 0 extension & 105 flexion. Baseline: See objective data Goal status: Ongoing   03/27/2024  5.  Decrease max pain to 2/10 with all activities. Baseline: See subjective data Goal status: Ongoing    03/27/2024  6.  Increase PSFS score  by at least 2-3 points to demonstrate measurable difference. Baseline: See objective data Goal status: Ongoing  03/27/2024  PLAN:  PT FREQUENCY: 1-2x/week  PT DURATION: 15 additional visits over 10 weeks  PLANNED  INTERVENTIONS: [id number]- PT Re-evaluation, 97110-Therapeutic exercises, 97530- Therapeutic activity, [id number]- Neuromuscular re-education, [phone]- Self Care, [id number]- Manual therapy, [phone]- Gait training, [phone]- Aquatic Therapy, Patient/Family education, Balance training, Stair training, Joint mobilization, Scar mobilization, and Moist heat  PLAN FOR NEXT SESSION:  add theraband kicks to HEP including instruction in safe environment if safe in clinic. Continue to work on functional activities and 90* turning.   Sherline Babe, Student-PT 03/27/2024, 12:57 PM  This entire session of physical therapy was performed under the direct supervision of PT signing evaluation /treatment. PT reviewed note and agrees.   Robin Waldron, PT, DPT 03/27/2024, 3:13 PM    "

## 2024-03-27 ENCOUNTER — Encounter: Payer: Self-pay | Admitting: Physical Therapy

## 2024-03-27 ENCOUNTER — Ambulatory Visit: Admitting: Physical Therapy

## 2024-03-27 DIAGNOSIS — M25662 Stiffness of left knee, not elsewhere classified: Secondary | ICD-10-CM

## 2024-03-27 DIAGNOSIS — R2689 Other abnormalities of gait and mobility: Secondary | ICD-10-CM

## 2024-03-27 DIAGNOSIS — R2681 Unsteadiness on feet: Secondary | ICD-10-CM

## 2024-03-27 DIAGNOSIS — G8929 Other chronic pain: Secondary | ICD-10-CM

## 2024-03-27 DIAGNOSIS — M25562 Pain in left knee: Secondary | ICD-10-CM

## 2024-03-27 DIAGNOSIS — R600 Localized edema: Secondary | ICD-10-CM

## 2024-03-27 DIAGNOSIS — M6281 Muscle weakness (generalized): Secondary | ICD-10-CM | POA: Diagnosis not present

## 2024-03-28 NOTE — Therapy (Incomplete)
 " OUTPATIENT PHYSICAL THERAPY LOWER EXTREMITY TREATMENT   Patient Name: Jeffrey [name] MRN: [id number] DOB:1956-01-23, 69 y.o., male Today's Date: 03/28/2024  END OF SESSION:     Past Medical History:  Diagnosis Date   Aneurysm of infrarenal abdominal aorta    CAD (coronary artery disease)    4 stents   Carotid artery disease    Left ICA   Diabetes mellitus without complication (HCC)    Dysrhythmia    A. Fib   Hyperlipidemia    Intracerebral hemorrhage (HCC) 1960   Peripheral neuropathy    Post-traumatic osteoarthritis of left knee    Past Surgical History:  Procedure Laterality Date   APPENDECTOMY  1960   I & D EXTREMITY Right 06/19/2020   Procedure: IRRIGATION AND DEBRIDEMENT LONG FINGER;  Surgeon: Murrell Drivers, MD;  Location: [facility] OR;  Service: Orthopedics;  Laterality: Right;   LEFT HEART CATH  06/20/2018   ORIF FEMUR FRACTURE  1988   ORIF FEMUR FRACTURE Left 10/03/2023   Procedure: OPEN REDUCTION INTERNAL FIXATION (ORIF) DISTAL FEMUR FRACTURE;  Surgeon: Celena Sharper, MD;  Location: [facility] OR;  Service: Orthopedics;  Laterality: Left;   TOTAL KNEE ARTHROPLASTY Left 05/12/2023   Procedure: LEFT TOTAL KNEE ARTHROPLASTY;  Surgeon: Jerri Kay CHRISTELLA, MD;  Location: [facility] OR;  Service: Orthopedics;  Laterality: Left;   VARICOSE VEIN SURGERY Right    right leg   Patient Active Problem List   Diagnosis Date Noted   Periprosthetic fracture around internal prosthetic left knee joint 10/02/2023   Supracondylar fracture of left femur (HCC) 10/02/2023   Closed left femoral fracture (HCC) 10/02/2023   Hypokalemia 10/02/2023   Status post total left knee replacement 05/12/2023   Post-traumatic osteoarthritis of left knee 01/31/2023   Atrial fibrillation (HCC) 11/26/2021   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  Rationale for Evaluation and Treatment: Rehabilitation  ONSET DATE: 10/03/23  SUBJECTIVE:   SUBJECTIVE STATEMENT:  [facility]. He has taken the physical disc with the scan to the [place] and waiting to hear follow up on results from multiple physicians.   PERTINENT HISTORY: L TKA 05/12/23,  L femur fx after fall 09/30/23 ORIF 10/03/23, this is 3rd L femur fx, CAD, DM, peripheral neuropathy  PAIN: Are you having pain? Yes: NPRS scale: Since last visit with increased activity 6/10 ; rest 2/10  Pain location: left thigh distal - lateral  Pain description: dull, sharp Aggravating factors: walking, standing Relieving factors: rest, pain meds at night  PRECAUTIONS: Other: + L TKA  RED FLAGS: 3rd L femur fracture   WEIGHT BEARING RESTRICTIONS: Yes WBAT  FALLS:  Has patient fallen in last 6 months? Yes. Number of falls 1  LIVING ENVIRONMENT: Lives with: lives with their spouse Lives in: House/apartment Stairs: Yes: External: 4 steps; can reach both Has following equipment at home: Single point cane  OCCUPATION: retired   PLOF: Independent  PATIENT GOALS: decrease pain  NEXT MD VISIT: unknown  OBJECTIVE:  Note: Objective measures were completed at Evaluation unless otherwise noted.  DIAGNOSTIC FINDINGS: 11/08/23 X-rays demonstrate evidence of  consolidation at the fracture site.  No  hardware complication.   PATIENT SURVEYS:  PSFS: THE PATIENT SPECIFIC FUNCTIONAL SCALE  Place score of 0-10 (0 = unable to perform activity and 10 = able to perform activity at the same level as before injury or problem)  Activity Date: 12/13/23 02/07/24   walking 7 8   2.transfers (stand/sit & getting on/off tractor) 8 9    3.stairs  3 without handrails and >3 with handrail 2  5   4.      Total Score 5.6 7.33     Total Score = Sum of activity scores/number of activities  Minimally Detectable Change: 3 points (for single activity); 2 points (for average score)  [facility] (nd). The Patient Specific Functional Scale . Retrieved from [email or web address]   COGNITION: Overall cognitive status: Within functional limits for tasks assessed     SENSATION: + hx neuropathy feet  EDEMA: ---  MUSCLE LENGTH: Hamstrings: Right mod restriction; Left Right mod restriction    POSTURE: flexed trunk   PALPATION: 1+ tenderness at ant knee and at lateral incision  LOWER EXTREMITY ROM: hip PROM [facility]  A/P ROM Left eval Left 02/14/24  Hip flexion 100   Hip extension    Hip abduction 40   Hip adduction    Hip internal rotation    Hip external rotation    Knee flexion 90/100 Seated A: 110*   Knee extension +5 Standing -3* Seated LAQ -14*  Ankle dorsiflexion    Ankle plantarflexion    Ankle inversion    Ankle eversion     (Blank rows = not tested)  LOWER EXTREMITY MMT:  MMT Left eval Right 02/19/24 Left 02/19/24  Hip flexion 4-  5/5  Hip extension 4-  4/5  Hip abduction 4-  4/5  Hip adduction   4/5  Hip internal rotation   4-/5  Hip external rotation   4/5  Knee flexion 4  4/5  Knee extension 4- HH dynameter 79.2#, 80.6# HH dynameter 46.6#. 43.8#  Ankle dorsiflexion  3-/5 3+/5  Ankle plantarflexion  2+/5 Unable to lift heels in standing 2+/5 Unable to lift heels in standing  Ankle inversion     Ankle eversion      (Blank rows = not tested)   FUNCTIONAL TESTS:  02/19/2024: 5x STS from 18 chair with armrests 34.03 with balance issues stabilizing upon arising  Evaluation on 12/13/2023: 5 times sit to stand: 15 sec with UE use and inc sway/neuropathy  GAIT: 02/19/2024:   Gait Velocity: no AD self-selected 2.22 ft/sec  and fast pace 2.70 ft/sec Pt has antalgic pattern with LLE decreased stance duration and abducted.  Pt negotiates ramp without device with slow cautious movements with increased deviations above.  Pt negotiates curb without device requiring modified technique (up with good & down with bad) due to LLE strength.   Pt neg stairs: descending with single rail modified technique modified independent; using LLE or alternating pattern with 2 rails only with supervision.  Ascending with right rail alternating pattern but notable decreased strength in LLE with inability to reach full knee & hip extension.     Evaluation on 12/13/2023: Distance walked: short community Assistive device utilized: Single point cane Level of assistance: Modified independence Comments: ---  TODAY'S TREATMENT 03/27/24  TherEx Recumbent bike seat 8 for 8 min; level 3  TherAct Lt. Side Lateral step up on BOSU with contralateral LE reaching across midline ant with stance knee ext and post with stance  knee unlocked / flexion. 2 sets 10 reps each leg Theraband (green) stepping with cane support in RUE: 10 reps ea flexion, abd, ext & add.  PT minA for balance.  Resisted gait with turning Rt. & Lt. 90* from stationary position with no UE support and VC cues for balance and body awareness. (Progression: turning into a wide 180* turn that was slow and controlled with increased awareness on foot placement and weight shift from stationary position to simulate a natural turn > walk transition )   Assessed patient ability to don jacket in standing and educate patient on safely doing this at home in a safe set up environment with chair; pt. demonstrated and verbalized understanding. Has been practicing at home.    TREATMENT:  L LE   03/25/2024 TherEx SciFit  (pedal longest lever arm); 8 min   TherAct BOSU Ball step-on, over, and down with BUE support on parallel bars; 5 reps each direction alternating foot on top of BOSU ball to  focus on LE stabilization Lt. Side Lateral step up on BOSU with contralateral LE reaching across midline ant with stance knee ext and post with stance knee unlocked / flexion. 10 reps Stepping onto Airex with contralateral LE tapping knee to counter then stepping back to floor with BUE finger tip support on counter 10 reps with each LE Side step on/off Airex with BUE support on upper cabinets (facilitate upright posture) 10 reps with each LE.   Theraband (green) stepping with cane support in RUE: 10 reps ea flexion, abd, ext & add.  PT minA for balance.   Neuromuscular Re-education (for LE body awareness/proprioception and trunk control during turning activities) 90* stepping over one 1inch PVC pipes 10 reps to right & to left with min-modA for balance.  4-square stepping (without turns) CW & CCW over 1 PVC pipes 3 reps ea direction (forward & backward stepping with ea LE) pt required minA for balance reactions. Progressed to holding 10# kettle bell in UE 2 reps in RUE & 2 reps in LUE.   Standing on Airex mat: tapping four corners for SLS stability with BUE finger tip support on upper cabinet. 5 reps with ea LE. minA for balance.    TREATMENT:  L LE   03/20/2024 TherEx Recumbent bike seat 7; 8 min level 3  TherAct Lt. Side Lateral step up on BOSU with contralateral LE reaching across midline ant with stance knee ext and post with stance knee unlocked / flexion.   Neuromuscular Re-education (for LE body awareness/proprioception and trunk control during turning activities) Tandem stance in parallel bars; 30 secs x 3 alternating Lt. And Rt. Foot in front and back with intermittent touching for balance Tandem Stance on airex pad in parallel bars; 30 secs x 3 alternating Lt. And Rt. Foot in front and back with intermittent touching for balance Four square step and 90* turn stepping over one 1inch PVC pipes changing direction every 3rd set; 3 x 10; ModA (1 person) 4-square stepping (without turns)  CW & CCW over 1 PVC pipes 5 reps ea direction (forward & backward stepping with ea LE) pt required minA for balance reactions. (Progressed: to 10lb kettlebell hold in Lt and Rt. UE 3rd set both CW and CCW)  Therapist demonstration of proper patterning for 4-square stepping with and without kettlebell before patient demonstration. Therapist provided VC and tactile cues during patient demonstration for stepping safety and reactive stepping techniques.    TREATMENT:   L LE   03/18/2024 TherEx  Recumbent bike seat 7; 8 min slow revolutions limited by soreness.  TherAct Side stepping with PT demo & cues on technique engaging hips / knees progressing BUEs on //bars to single UE ipsilateral to outer LE. Side stepping with PT demo & cues on technique engaging hip. Progressed to turning 90* from stationary position with ipsilateral UE to no UE support (intermittent touch for balance) BOSU Ball step-on, over, and down with BUE support on parallel bars; 10 reps each direction alternating foot on top of BOSU ball to focus on LE stabilization  Resisted gait forward walking, backward walking, and lateral side steps to Rt. and Lt.; 20 ft.  Assessed patient ability to don jacket in standing and educate patient on safely doing this at home in a safe set up environment with chair; pt. demonstrated and verbalized understanding  Neuromuscular Re-education: (balance with upright posture) Four square step and 90* turn stepping over one 1inch PVC pipes changing direction every 3rd set; 3 x 10; ModA (1 person) 4-square stepping (without turns) CW & CCW over 1 PVC pipes 5 reps ea direction (forward & backward stepping with ea LE) pt required minA for balance reactions.    HOME EXERCISE PROGRAM: Access Code: [email or web address] URL: [email or web address] Date: 01/24/2024 Prepared by: Grayce Spatz  Exercises - Long Sitting Quad Set with Towel Roll Under Heel  - 2 x daily - 7 x weekly - 2 sets - 10 reps - 2  hold - Active Straight Leg Raise with Quad Set  - 2 x daily - 7 x weekly - 2 sets - 10 reps - Supine Heel Slides  - 2 x daily - 7 x weekly - 2 sets - 10 reps - 2 hold - Supine Bridge  - 2 x daily - 7 x weekly - 2 sets - 10 reps - 2 hold - Mini Squat with Counter Support  - 2 x daily - 7 x weekly - 2 sets - 10 reps - Seated Table Hamstring Stretch  - 1 x daily - 7 x weekly - 1 sets - 3 reps - 30 seconds hold - Supine ITB Stretch with Strap  - 1 x daily - 7 x weekly - 1 sets - 3 reps - 30 seconds hold - Seated Hip Internal Rotation AROM  - 1 x daily - 7 x weekly - 2 sets - 10 reps - 5 seconds hold - Seated Hip External Rotation AROM  - 1 x daily - 7 x weekly - 2 sets - 10 reps - 5 seconds hold - Gastroc Stretch on Step  - 1 x daily - 7 x weekly - 1 sets - 3 reps - 30 seconds hold - Standing Hamstring Stretch with Step  - 1 x daily - 7 x weekly - 1 sets - 3 reps - 30 seconds hold - Seated Ankle Plantar Flexion with Resistance Loop  - 1 x daily - 7 x weekly - 2 sets - 10 reps - 5 seconds hold - Long sitting ankle plantarflexion with eversion  - 1 x daily - 7 x weekly - 2 sets - 10 reps - 5 seconds hold - Long sitting ankle plantarflexion with eversion  - 1 x daily - 7 x weekly - 2 sets - 10 reps - 5 seconds hold - Standing Hip Abduction with Resistance at Thighs  - 1 x daily - 7 x weekly - 2 sets - 10 reps - 5 seconds hold - Standing Hip Extension with Resistance at Ankles and Counter Support  -  1 x daily - 7 x weekly - 2 sets - 10 reps - 5 seconds hold - Standing Hip Flexion with Anchored Resistance and Chair Support  - 1 x daily - 7 x weekly - 2 sets - 10 reps - 5 seconds hold  ASSESSMENT:  CLINICAL IMPRESSION: Patient is improving turning capability during gait as seen in ability to make more turns before transitioning into natural gait pattern. Patient still demonstrates challenges with balance as seen with corrective stepping and reliance on single point cane or chair for safety. He requires  minA during single limb stance activities for balance, but he is building strength in the Lt knee with increase repetitions. Patient also is improving with standing activities like donning a jacket in standing, but continues to practice at home in a safe environment for balance control. Patient will continue to benefit from skilled PT services to improve Lt knee strength, balance/coordination, functional activity endurance, and turning during gait.  OBJECTIVE IMPAIRMENTS: decreased endurance, decreased ROM, decreased strength, impaired flexibility, and pain.   ACTIVITY LIMITATIONS: squatting, stairs, and transfers  PARTICIPATION LIMITATIONS: community activity, church, and farming  PERSONAL FACTORS: 1-2 comorbidities: previous fractures, LE neuropathy are also affecting patient's functional outcome.   REHAB POTENTIAL: Good  CLINICAL DECISION MAKING: Stable/uncomplicated  EVALUATION COMPLEXITY: Moderate   GOALS: Goals reviewed with patient? Yes  SHORT TERM GOALS: Target date: 03/27/2024 (date was updated due to delay with [facility])   Pt to be independent with updated HEP. Baseline: Goal status:  ongoing  03/27/2024  2.  Pt reports pain <4/10 with activities. Baseline: 7/10 Goal status: ongoing  03/27/2024  UPDATED LONG TERM GOALS: Target date: 04/18/2024  10weeks  Pt to be independent with ongoing HEP at time of discharge. Baseline:  Goal status: Ongoing     03/27/2024  2.  Pt able to ambulate community distances without AD and no pain. Baseline: See objective data Goal status: Ongoing     03/27/2024  3.  Left LE strength hip and knee 5/5.   Baseline: See objective data Goal status: Ongoing     03/27/2024  4.  Increase knee  standing AROM 0 extension & 105 flexion. Baseline: See objective data Goal status: Ongoing   03/27/2024  5.  Decrease max pain to 2/10 with all activities. Baseline: See subjective data Goal status: Ongoing    03/27/2024  6.  Increase PSFS  score  by at least 2-3 points to demonstrate measurable difference. Baseline: See objective data Goal status: Ongoing  03/27/2024  PLAN:  PT FREQUENCY: 1-2x/week  PT DURATION: 15 additional visits over 10 weeks  PLANNED INTERVENTIONS: [id number]- PT Re-evaluation, 97110-Therapeutic exercises, 97530- Therapeutic activity, 97112- Neuromuscular re-education, 97535- Self Care, [id number]- Manual therapy, [phone]- Gait training, [phone]- Aquatic Therapy, Patient/Family education, Balance training, Stair training, Joint mobilization, Scar mobilization, and Moist heat  PLAN FOR NEXT SESSION:  add theraband kicks to HEP including instruction in safe environment if safe in clinic. Continue to work on functional activities and 90* turning.   Sherline Babe, Student-PT 03/27/2024, 12:57 PM  This entire session of physical therapy was performed under the direct supervision of PT signing evaluation /treatment. PT reviewed note and agrees.   Sherline Babe, Student-PT, DPT 03/28/2024, 2:39 PM    "

## 2024-04-01 ENCOUNTER — Encounter: Admitting: Physical Therapy

## 2024-04-03 ENCOUNTER — Encounter: Admitting: Physical Therapy

## 2024-04-10 ENCOUNTER — Encounter: Admitting: Physical Therapy

## 2024-05-07 ENCOUNTER — Ambulatory Visit: Admitting: Orthopaedic Surgery
